# Patient Record
Sex: Female | Born: 1955 | Race: White | Hispanic: No | Marital: Married | Smoking: Never smoker
Health system: Southern US, Community
[De-identification: ages and names within clinical notes are randomized; demographics above are authoritative.]

## PROBLEM LIST (undated history)

## (undated) DIAGNOSIS — G4730 Sleep apnea, unspecified: Secondary | ICD-10-CM

## (undated) DIAGNOSIS — I10 Essential (primary) hypertension: Secondary | ICD-10-CM

## (undated) DIAGNOSIS — R32 Unspecified urinary incontinence: Secondary | ICD-10-CM

## (undated) HISTORY — PX: GALLBLADDER SURGERY: SHX652

## (undated) HISTORY — DX: Unspecified urinary incontinence: R32

## (undated) HISTORY — DX: Essential (primary) hypertension: I10

## (undated) HISTORY — DX: Sleep apnea, unspecified: G47.30

## (undated) HISTORY — PX: CHOLECYSTECTOMY: SHX55

## (undated) HISTORY — PX: ROTATOR CUFF REPAIR: SHX139

## (undated) HISTORY — PX: FRACTURE SURGERY: SHX138

## (undated) HISTORY — PX: JOINT REPLACEMENT: SHX530

---

## 2003-05-18 HISTORY — PX: TOTAL ABDOMINAL HYSTERECTOMY: SHX209

## 2013-12-24 LAB — BASIC METABOLIC PANEL
BUN: 20 mg/dL (ref 4–21)
CREATININE: 1 mg/dL (ref 0.5–1.1)

## 2013-12-24 LAB — CBC AND DIFFERENTIAL
HEMATOCRIT: 43 % (ref 36–46)
HEMOGLOBIN: 15 g/dL (ref 12.0–16.0)
Neutrophils Absolute: 5 /uL
Platelets: 365 10*3/uL (ref 150–399)
WBC: 8.5 10^3/mL

## 2013-12-24 LAB — HM PAP SMEAR: HM Pap smear: 10115

## 2013-12-24 LAB — TSH: TSH: 3.1 u[IU]/mL (ref 0.41–5.90)

## 2013-12-24 LAB — LIPID PANEL
Cholesterol: 219 mg/dL — AB (ref 0–200)
HDL: 58 mg/dL (ref 35–70)
LDL CALC: 137 mg/dL
TRIGLYCERIDES: 122 mg/dL (ref 40–160)

## 2013-12-24 LAB — HEPATIC FUNCTION PANEL
ALT: 15 U/L (ref 7–35)
AST: 19 U/L (ref 13–35)
Alkaline Phosphatase: 62 U/L (ref 25–125)
Bilirubin, Total: 0.4 mg/dL

## 2013-12-27 ENCOUNTER — Ambulatory Visit (INDEPENDENT_AMBULATORY_CARE_PROVIDER_SITE_OTHER): Payer: No Typology Code available for payment source | Admitting: Family Medicine

## 2013-12-27 ENCOUNTER — Encounter: Payer: Self-pay | Admitting: Family Medicine

## 2013-12-27 VITALS — BP 177/93 | HR 92 | Temp 97.7°F | Resp 16 | Ht 61.5 in | Wt 183.0 lb

## 2013-12-27 DIAGNOSIS — E8940 Asymptomatic postprocedural ovarian failure: Secondary | ICD-10-CM | POA: Insufficient documentation

## 2013-12-27 DIAGNOSIS — E669 Obesity, unspecified: Secondary | ICD-10-CM | POA: Insufficient documentation

## 2013-12-27 DIAGNOSIS — Z79890 Hormone replacement therapy (postmenopausal): Secondary | ICD-10-CM

## 2013-12-27 DIAGNOSIS — I10 Essential (primary) hypertension: Secondary | ICD-10-CM

## 2013-12-27 DIAGNOSIS — Z87448 Personal history of other diseases of urinary system: Secondary | ICD-10-CM

## 2013-12-27 DIAGNOSIS — E66811 Obesity, class 1: Secondary | ICD-10-CM | POA: Insufficient documentation

## 2013-12-27 MED ORDER — HYDROCHLOROTHIAZIDE 25 MG PO TABS: 25.0000 mg | ORAL_TABLET | Freq: Every day | ORAL | Status: DC

## 2013-12-27 MED ORDER — METOPROLOL TARTRATE 100 MG PO TABS: 100.0000 mg | ORAL_TABLET | Freq: Two times a day (BID) | ORAL | Status: DC

## 2013-12-27 MED ORDER — ENALAPRIL MALEATE 5 MG PO TABS: 5.0000 mg | ORAL_TABLET | Freq: Every day | ORAL | Status: DC

## 2013-12-27 NOTE — Patient Instructions (Signed)
Heart Disease Prevention Heart disease can lead to heart attacks and strokes. This is a leading cause of death. Heart disease can be inherited and can be caused from the lifestyle you lead. You can do a lot to keep your heart and blood vessels healthy.  WHAT SHOULD I DO EACH DAY TO KEEP MY HEART HEALTHY?  Do not smoke.  Follow a healthy eating plan as recommended by your caregiver or dietitian.  Be active for a total of 30 minutes most days. Ask your caregiver what activities are best for you.  Limit the amount of alcohol you drink.  Involve family and friends to help you with a healthy lifestyle. HOW DOES HEART DISEASE CAUSE HIGH BLOOD PRESSURE?  Narrowed blood vessels leave a smaller opening for blood to flow through. It is like turning on a garden hose and holding your thumb over the opening. The smaller opening makes the water shoot out with more pressure. In the same way, narrowed blood vessels can lead to high blood pressure. Other factors, such as kidney problems and being overweight, also can lead to high blood pressure.  If you have high blood pressure you may need to take blood pressure medicine every day. Some types of blood pressure medicine can also help keep your kidneys healthy.  Many people with diabetes also have high blood pressure. If you have heart, eye, or kidney problems from diabetes, high blood pressure can make them worse. HOW DO MY BLOOD VESSELS GET CLOGGED?  Cholesterol is a substance that is made by the body and used for many important functions. It is also found in food that comes from animals. When your cholesterol is high, it can stick to the insides of your blood vessels, making them narrowed and even clogged. This problem is called atherosclerosis.  Narrowed and clogged blood vessels make it harder for blood to get to important body organs. This can cause problems such as:  Chest pain (angina). Angina can cause temporary pain in your chest, arms, shoulders,  or back. You may feel the pain more when your heart beats faster, such as when you exercise. The pain may go away when you rest. You also may feel very weak and sweaty.  A heart attack. A heart attack happens when a blood vessel in or near the heart becomes blocked. Not enough blood is getting to the heart. During a heart attack, you may have chest pain in your chest, arms, shoulders, or back along with nausea, indigestion, extreme weakness, and sweating. WHAT CAN I DO TO PREVENT HEART DISEASE?   Keep your blood pressure under control as recommended by your caregiver.  Keep your cholesterol under control. Have it checked at least once a year. Target cholesterol levels for most people are:  Total blood cholesterol level: Below 200.  LDL (bad) cholesterol: Below 100.  HDL (good) cholesterol: Above 40 in men and above 50 in women.  Triglycerides (another type of fat in the blood): Below 150.  Make physical activity a part of your daily routine. Check with your caregiver to learn what activities are best for you.  Make sure that the foods you eat are "heart-healthy."  Include foods high in fiber, such as oat bran, oatmeal, whole-grain breads and cereals.  Cut back on fried foods and foods high in saturated fat. This includes foods such as meats, butter, whole dairy products, shortening, and coconut or palm oil.  Avoid salty foods such as canned food, luncheon meat, salty snacks, and fast food.    Eat more fruits and vegetables.  Drink less alcohol.  Lose weight as recommended by your caregiver.  If you smoke, quit. Your caregiver can help you with quitting options.  Ask your caregiver whether you should take a daily aspirin. Studies have shown that taking aspirin can help reduce your risk of heart disease and stroke.  Take your prescribed medicines as directed. WHAT ARE THE WARNING SIGNS OF A HEART ATTACK? You may have one or more of the following warning signs:  Chest pain or  discomfort.  Pain or discomfort in your arms, back, jaw, or neck.  Indigestion or stomach pain.  Shortness of breath.  Sweating.  Nausea or vomiting.  Lightheadedness.  No warning signs at all or they may come and go. FOR MORE INFORMATION  To find out more about heart disease and stroke prevention, visit the American Heart Association website at www.americanheart.org Document Released: 12/16/2003 Document Revised: 11/02/2011 Document Reviewed: 06/27/2013 [facility] Patient Information 2015 [place], [place]. This information is not intended to replace advice given to you by your health care provider. Make sure you discuss any questions you have with your health care provider.        Mediterranean Diet  Why follow it? Research shows.   Those who follow the Mediterranean diet have a reduced risk of heart disease    The diet is associated with a reduced incidence of Parkinson's and Alzheimer's diseases   People following the diet may have longer life expectancies and lower rates of chronic diseases    The Dietary Guidelines for Americans recommends the Mediterranean diet as an eating plan to promote health and prevent disease  What Is the Mediterranean Diet?    Healthy eating plan based on typical foods and recipes of Mediterranean-style cooking   The diet is primarily a plant based diet; these foods should make up a majority of meals   Starches - Plant based foods should make up a majority of meals - They are an important sources of vitamins, minerals, energy, antioxidants, and fiber - Choose whole grains, foods high in fiber and minimally processed items  - Typical grain sources include wheat, oats, barley, corn, brown rice, bulgar, farro, millet, polenta, couscous  - Various types of beans include chickpeas, lentils, fava beans, black beans, white beans   Fruits  Veggies - Large quantities of antioxidant rich fruits & veggies; 6 or more servings  - Vegetables can be eaten raw or  lightly drizzled with oil and cooked  - Vegetables common to the traditional Mediterranean Diet include: artichokes, arugula, beets, broccoli, brussel sprouts, cabbage, carrots, celery, collard greens, cucumbers, eggplant, kale, leeks, lemons, lettuce, mushrooms, okra, onions, peas, peppers, potatoes, pumpkin, radishes, rutabaga, shallots, spinach, sweet potatoes, turnips, zucchini - Fruits common to the Mediterranean Diet include: apples, apricots, avocados, cherries, clementines, dates, figs, grapefruits, grapes, melons, nectarines, oranges, peaches, pears, pomegranates, strawberries, tangerines  Fats - Replace butter and margarine with healthy oils, such as olive oil, canola oil, and tahini  - Limit nuts to no more than a handful a day  - Nuts include walnuts, almonds, pecans, pistachios, pine nuts  - Limit or avoid candied, honey roasted or heavily salted nuts - Olives are central to the [employer]iterranean diet - can be eaten whole or used in a variety of dishes   Meats Protein - Limiting red meat: no more than a few times a month - When eating red meat: choose lean cuts and keep the portion to the size of deck of cards -  Eggs: approx. 0 to 4 times a week  - Fish and lean poultry: at least 2 a week  - Healthy protein sources include, chicken, [place], lean beef, lamb - Increase intake of seafood such as tuna, salmon, trout, mackerel, shrimp, scallops - Avoid or limit high fat processed meats such as sausage and bacon  Dairy - Include moderate amounts of low fat dairy products  - Focus on healthy dairy such as fat free yogurt, skim milk, low or reduced fat cheese - Limit dairy products higher in fat such as whole or 2% milk, cheese, ice cream  Alcohol - Moderate amounts of red wine is ok  - No more than 5 oz daily for women (all ages) and men older than age 55  - No more than 10 oz of wine daily for men younger than 83  Other - Limit sweets and other desserts  - Use herbs and spices instead of  salt to flavor foods  - Herbs and spices common to the traditional Mediterranean Diet include: basil, bay leaves, chives, cloves, cumin, fennel, garlic, lavender, marjoram, mint, oregano, parsley, pepper, rosemary, sage, savory, sumac, tarragon, thyme   It's not just a diet, it's a lifestyle:    The Mediterranean diet includes lifestyle factors typical of those in the region    Foods, drinks and meals are best eaten with others and savored   Daily physical activity is important for overall good health   This could be strenuous exercise like running and aerobics   This could also be more leisurely activities such as walking, housework, yard-work, or taking the stairs   Moderation is the key; a balanced and healthy diet accommodates most foods and drinks   Consider portion sizes and frequency of consumption of certain foods   Meal Ideas & Options:    Breakfast:  o Whole wheat toast or whole wheat English muffins with peanut butter & hard boiled egg o Steel cut oats topped with apples & cinnamon and skim milk  o Fresh fruit: banana, strawberries, melon, berries, peaches  o Smoothies: strawberries, bananas, greek yogurt, peanut butter o Low fat greek yogurt with blueberries and granola  o Egg white omelet with spinach and mushrooms o Breakfast couscous: whole wheat couscous, apricots, skim milk, cranberries    Sandwiches:  o Hummus and grilled vegetables (peppers, zucchini, squash) on whole wheat bread   o Grilled chicken on whole wheat pita with lettuce, tomatoes, cucumbers or tzatziki  o Tuna salad on whole wheat bread: tuna salad made with greek yogurt, olives, red peppers, capers, green onions o Garlic rosemary lamb pita: lamb sauted with garlic, rosemary, salt & pepper; add lettuce, cucumber, greek yogurt to pita - flavor with lemon juice and black pepper    Seafood:  o Mediterranean grilled salmon, seasoned with garlic, basil, parsley, lemon juice and black pepper o Shrimp, lemon, and  spinach whole-grain pasta salad made with low fat greek yogurt  o Seared scallops with lemon orzo  o Seared tuna steaks seasoned salt, pepper, coriander topped with tomato mixture of olives, tomatoes, olive oil, minced garlic, parsley, green onions and cappers    Meats:  o Herbed greek chicken salad with kalamata olives, cucumber, feta  o Red bell peppers stuffed with spinach, bulgur, lean ground beef (or lentils) & topped with feta   o Kebabs: skewers of chicken, tomatoes, onions, zucchini, squash  o [place] burgers: made with red onions, mint, dill, lemon juice, feta cheese topped with roasted red peppers  Vegetarian o Cucumber salad: cucumbers, artichoke hearts, celery, red onion, feta cheese, tossed in olive oil & lemon juice  o Hummus and whole grain pita points with a greek salad (lettuce, tomato, feta, olives, cucumbers, red onion) o Lentil soup with celery, carrots made with vegetable broth, garlic, salt and pepper  o Tabouli salad: parsley, bulgur, mint, scallions, cucumbers, tomato, radishes, lemon juice, olive oil, salt and pepper.    Hematuria Hematuria is blood in your urine. It can be caused by a bladder infection, kidney infection, prostate infection, kidney stone, or cancer of your urinary tract. Infections can usually be treated with medicine, and a kidney stone usually will pass through your urine. If neither of these is the cause of your hematuria, further workup to find out the reason may be needed. It is very important that you tell your health care provider about any blood you see in your urine, even if the blood stops without treatment or happens without causing pain. Blood in your urine that happens and then stops and then happens again can be a symptom of a very serious condition. Also, pain is not a symptom in the initial stages of many urinary cancers. HOME CARE INSTRUCTIONS   Drink lots of fluid, 3-4 quarts a day. If you have been diagnosed with an infection,  cranberry juice is especially recommended, in addition to large amounts of water.  Avoid caffeine, tea, and carbonated beverages because they tend to irritate the bladder.  Avoid alcohol because it may irritate the prostate.  Take all medicines as directed by your health care provider.  If you were prescribed an antibiotic medicine, finish it all even if you start to feel better.  If you have been diagnosed with a kidney stone, follow your health care provider's instructions regarding straining your urine to catch the stone.  Empty your bladder often. Avoid holding urine for long periods of time.  After a bowel movement, women should cleanse front to back. Use each tissue only once.  Empty your bladder before and after sexual intercourse if you are a female. SEEK MEDICAL CARE IF:  You develop back pain.  You have a fever.  You have a feeling of sickness in your stomach (nausea) or vomiting.  Your symptoms are not better in 3 days. Return sooner if you are getting worse. SEEK IMMEDIATE MEDICAL CARE IF:   You develop severe vomiting and are unable to keep the medicine down.  You develop severe back or abdominal pain despite taking your medicines.  You begin passing a large amount of blood or clots in your urine.  You feel extremely weak or faint, or you pass out. MAKE SURE YOU:   Understand these instructions.  Will watch your condition.  Will get help right away if you are not doing well or get worse. Document Released: 05/03/2005 Document Revised: 09/17/2013 Document Reviewed: 01/01/2013 [facility] Patient Information 2015 [place], [place]. This information is not intended to replace advice given to you by your health care provider. Make sure you discuss any questions you have with your health care provider.

## 2014-01-04 ENCOUNTER — Encounter: Payer: Self-pay | Admitting: Family Medicine

## 2014-01-04 ENCOUNTER — Telehealth: Payer: Self-pay | Admitting: Family Medicine

## 2014-01-04 NOTE — Telephone Encounter (Signed)
Patient states she would like a medication prescribed for a topical cream of testosterone 1%. Patient said she uses a compounding pharmacy and the number is [phone]  [phone]

## 2014-01-04 NOTE — Telephone Encounter (Signed)
LM for pt to RTC for discussion on using testosterone cream.

## 2014-01-06 NOTE — Progress Notes (Signed)
Subjective:    Patient ID: Erika Wolf, female    DOB: 1955-07-26, 58 y.o.   MRN: [id number]  HPI  This 58 y.o. Cauc female is new to [facility]; her family relocated to [place], [place] several months ago. She has HTN which has been well controlled when pt is taking all prescribed medications. She has been "stretching" medication until she could get established  w/ this practice. Healthy lifestyle including nutrition and fitness have helped pt w/ weight reduction.   Pt has a hx of microscopic hematuria; she denies symptoms and hx for renal stones or bladder disease is negative.  Past Medical History  Diagnosis Date  . HTN (hypertension)    Prior to Admission medications   Medication Sig Start Date End Date Taking? Authorizing Provider  enalapril (VASOTEC) 5 MG tablet Take 1 tablet (5 mg total) by mouth daily.   Yes   hydrochlorothiazide (HYDRODIURIL) 25 MG tablet Take 1 tablet (25 mg total) by mouth daily.   Yes   metoprolol (LOPRESSOR) 100 MG tablet Take 1 tablet (100 mg total) by mouth 2 (two) times daily.   Yes     History   Social History  . Marital Status: Married    Spouse Name: N/A    Number of Children: N/A  . Years of Education: N/A   Occupational History  . homemaker    Social History Main Topics  . Smoking status: Never Smoker   . Smokeless tobacco: Not on file  . Alcohol Use: Yes  . Drug Use: No  . Sexual Activity: Yes   Other Topics Concern  . Not on file   Social History Narrative   Married;   5 children, 4 live in [place], 1 lives in [place].   2 grandchildren          Family History  Problem Relation Age of Onset  . Hyperlipidemia Mother   . Hypertension Mother   . Cancer Father     colon  . Aneurysm Father     brain  . Cancer Sister     ovarian  . Hyperlipidemia Brother     Review of Systems  Constitutional: Negative.   HENT: Negative.   Eyes: Negative for photophobia, pain, redness and visual disturbance.  Respiratory: Negative for  cough, chest tightness and shortness of breath.   Cardiovascular: Negative.   Endocrine: Negative.   Genitourinary: Positive for hematuria. Negative for dysuria, urgency, frequency, flank pain, difficulty urinating, vaginal pain and pelvic pain.  Musculoskeletal: Negative.   Skin: Negative.   Allergic/Immunologic: Negative.   Neurological: Negative for dizziness, syncope, weakness, light-headedness, numbness and headaches.  Psychiatric/Behavioral: Negative for confusion, sleep disturbance and dysphoric mood. The patient is not nervous/anxious.       Objective:   Physical Exam  Nursing note and vitals reviewed. Constitutional: She is oriented to person, place, and time. She appears well-developed and well-nourished. No distress.  HENT:  Head: Normocephalic and atraumatic.  Right Ear: External ear normal.  Left Ear: External ear normal.  Nose: Nose normal.  Mouth/Throat: Oropharynx is clear and moist.  Eyes: Conjunctivae and EOM are normal. Pupils are equal, round, and reactive to light. No scleral icterus.  Neck: Normal range of motion. Neck supple. No JVD present. No thyromegaly present.  Cardiovascular: Normal rate, regular rhythm and normal heart sounds.  Exam reveals no gallop and no friction rub.   No murmur heard. Pulmonary/Chest: Effort normal and breath sounds normal. No respiratory distress.  Abdominal: Soft. Normal appearance and bowel  sounds are normal. She exhibits no distension, no pulsatile midline mass and no mass. There is no hepatosplenomegaly. There is no tenderness. There is no guarding and no CVA tenderness.  Musculoskeletal: Normal range of motion. She exhibits no edema and no tenderness.  Lymphadenopathy:    She has no cervical adenopathy.  Neurological: She is alert and oriented to person, place, and time. She has normal reflexes. No cranial nerve deficit. She exhibits normal muscle tone. Coordination normal.  Skin: Skin is warm and dry. She is not diaphoretic. No  erythema. No pallor.  Psychiatric: She has a normal mood and affect. Her behavior is normal. Judgment and thought content normal.       Assessment & Plan:  Essential hypertension- Continue current medications; encourage TLCs.  History of hematuria- Request records; check UA at next visit.  Meds ordered this encounter  Medications  . enalapril (VASOTEC) 5 MG tablet    Sig: Take 1 tablet (5 mg total) by mouth daily.    Dispense:  90 tablet    Refill:  1  . hydrochlorothiazide (HYDRODIURIL) 25 MG tablet    Sig: Take 1 tablet (25 mg total) by mouth daily.    Dispense:  90 tablet    Refill:  1  . metoprolol (LOPRESSOR) 100 MG tablet    Sig: Take 1 tablet (100 mg total) by mouth 2 (two) times daily.    Dispense:  180 tablet    Refill:  1

## 2014-04-04 ENCOUNTER — Encounter: Payer: No Typology Code available for payment source | Admitting: Family Medicine

## 2017-11-04 LAB — HM MAMMOGRAPHY

## 2017-11-08 ENCOUNTER — Encounter: Payer: Self-pay | Admitting: Family Medicine

## 2017-11-08 ENCOUNTER — Ambulatory Visit (INDEPENDENT_AMBULATORY_CARE_PROVIDER_SITE_OTHER): Payer: No Typology Code available for payment source

## 2017-11-08 ENCOUNTER — Ambulatory Visit: Payer: No Typology Code available for payment source | Admitting: Family Medicine

## 2017-11-08 VITALS — BP 150/90 | HR 102 | Temp 98.2°F | Resp 17 | Ht 62.0 in | Wt 179.0 lb

## 2017-11-08 DIAGNOSIS — R0609 Other forms of dyspnea: Secondary | ICD-10-CM | POA: Diagnosis not present

## 2017-11-08 DIAGNOSIS — I10 Essential (primary) hypertension: Secondary | ICD-10-CM | POA: Diagnosis not present

## 2017-11-08 DIAGNOSIS — R059 Cough, unspecified: Secondary | ICD-10-CM

## 2017-11-08 DIAGNOSIS — J22 Unspecified acute lower respiratory infection: Secondary | ICD-10-CM

## 2017-11-08 DIAGNOSIS — R062 Wheezing: Secondary | ICD-10-CM

## 2017-11-08 DIAGNOSIS — Z7189 Other specified counseling: Secondary | ICD-10-CM | POA: Diagnosis not present

## 2017-11-08 DIAGNOSIS — R05 Cough: Secondary | ICD-10-CM

## 2017-11-08 MED ORDER — METOPROLOL TARTRATE 100 MG PO TABS: 100.0000 mg | ORAL_TABLET | Freq: Two times a day (BID) | ORAL | 1 refills | Status: DC

## 2017-11-08 MED ORDER — AZITHROMYCIN 250 MG PO TABS: ORAL_TABLET | ORAL | 0 refills | Status: DC

## 2017-11-08 MED ORDER — ALBUTEROL SULFATE HFA 108 (90 BASE) MCG/ACT IN AERS: 1.0000 | INHALATION_SPRAY | RESPIRATORY_TRACT | 0 refills | Status: DC | PRN

## 2017-11-08 MED ORDER — ENALAPRIL MALEATE 10 MG PO TABS: 10.0000 mg | ORAL_TABLET | Freq: Every day | ORAL | 1 refills | Status: DC

## 2017-11-08 MED ORDER — HYDROCHLOROTHIAZIDE 25 MG PO TABS: 25.0000 mg | ORAL_TABLET | Freq: Every day | ORAL | 1 refills | Status: DC

## 2017-11-08 MED ORDER — ESTRADIOL 1 MG PO TABS: 1.0000 mg | ORAL_TABLET | Freq: Every day | ORAL | 1 refills | Status: DC

## 2017-11-08 NOTE — Progress Notes (Addendum)
Subjective:  By signing my name below, I, Moises Blood, attest that this documentation has been prepared under the direction and in the presence of Merri Ray, MD. Electronically Signed: Moises Blood, [place]. 11/08/2017 , 3:19 PM .  Patient was seen in Room 3 .   Patient ID: Erika Wolf, female    DOB: [birth year], 62 y.o.   MRN: [id number] Chief Complaint  Patient presents with  . Establish Care   HPI Erika Wolf is a 62 y.o. female Here to establish care. She switched care due to insurance coverage. She is not fasting today.   HTN Appears patient was last seen here almost 4 years ago by Dr. Leward Quan for HTN. Appears patient has been seen by Dr. Claiborne Billings at [place], with last visit in Sept 2018. Note reviewed, she's on Vasotec 5 mg, HCTZ 25 mg, and metoprolol 100 mg bid.   BP Readings from Last 3 Encounters:  11/08/17 (!) 150/90  12/27/13 (!) 177/93   Creatinine was most recently 1.10 with eGFR of 51 in Jan 2018. Her BP was 130/84 at Sept 2018 visit with Dr. Claiborne Billings at [place].   Patient mentions her BP was running around 150s/80s while on medications. She states she started having productive cough with some wheezing started around 1 month ago. She came off of her metoprolol for about 2 weeks, and her BP was running around 150/91, but cough worsened. She restarted metoprolol for about a week now, with cough improved. She mentions history of exercise induced asthma, and never really used an inhaler for it. She noticed shortness of breath when walking up hills starting about 2 weeks ago. She denies history of heart problems. She denies chest pain or shortness of breath. She's been using Flonase and Aller-tec for her seasonal allergies, doing well on it. She denies history of smoking. She denies heartburn, burping, belching, night sweats, fever, unexplained weight change, or swelling.   OBGYN She is followed by Harle Battiest, Dr. Kris Mouton with [facility] physicians in [facility], with exam on May 2018. She had hysterectomy done in the past for fibroids, and was on estrogen replacement therapy at that time with Estrace 1 mg. She had normal TSH at that time. She denies personal history of cancer. Her daughter was recently diagnosed with breast cancer. Patient has noticed some lumps, but had negative mammograms.    Patient Active Problem List   Diagnosis Date Noted  . History of hematuria 12/27/2013  . Essential hypertension 12/27/2013  . Surgical menopause on hormone replacement therapy 12/27/2013  . Obesity (BMI 30.0-34.9) 12/27/2013   Past Medical History:  Diagnosis Date  . HTN (hypertension)    Past Surgical History:  Procedure Laterality Date  . CESAREAN SECTION  5 total  . GALLBLADDER SURGERY    . TOTAL ABDOMINAL HYSTERECTOMY  2005   Fibroid ovaries and tubes removed also   No Known Allergies Prior to Admission medications   Medication Sig Start Date End Date Taking? Authorizing Provider  enalapril (VASOTEC) 5 MG tablet Take 1 tablet (5 mg total) by mouth daily. 12/27/13  Yes Barton Fanny, MD  hydrochlorothiazide (HYDRODIURIL) 25 MG tablet Take 1 tablet (25 mg total) by mouth daily. 12/27/13  Yes Barton Fanny, MD  metoprolol (LOPRESSOR) 100 MG tablet Take 1 tablet (100 mg total) by mouth 2 (two) times daily. 12/27/13  Yes Barton Fanny, MD   Social History   Socioeconomic History  . Marital status: Married  Spouse name: Not on file  . Number of children: Not on file  . Years of education: Not on file  . Highest education level: Not on file  Occupational History  . Occupation: homemaker  Social Needs  . Financial resource strain: Not on file  . Food insecurity:    Worry: Not on file    Inability: Not on file  . Transportation needs:    Medical: Not on file    Non-medical: Not on file  Tobacco Use  . Smoking status: Never Smoker  . Smokeless tobacco: Never Used  Substance and Sexual  Activity  . Alcohol use: Yes  . Drug use: No  . Sexual activity: Yes  Lifestyle  . Physical activity:    Days per week: Not on file    Minutes per session: Not on file  . Stress: Not on file  Relationships  . Social connections:    Talks on phone: Not on file    Gets together: Not on file    Attends religious service: Not on file    Active member of club or organization: Not on file    Attends meetings of clubs or organizations: Not on file    Relationship status: Not on file  . Intimate partner violence:    Fear of current or ex partner: Not on file    Emotionally abused: Not on file    Physically abused: Not on file    Forced sexual activity: Not on file  Other Topics Concern  . Not on file  Social History Narrative   Married;   5 children, 4 live in [place], 1 lives in [place].   2 grandchildren          Review of Systems  Constitutional: Negative for appetite change, chills, diaphoresis, fatigue, fever and unexpected weight change.  HENT: Positive for congestion.   Respiratory: Positive for cough, shortness of breath and wheezing. Negative for chest tightness.   Cardiovascular: Negative for chest pain, palpitations and leg swelling.  Gastrointestinal: Negative for abdominal pain and blood in stool.  Neurological: Negative for dizziness, syncope, light-headedness and headaches.       Objective:   Physical Exam  Constitutional: She is oriented to person, place, and time. She appears well-developed and well-nourished. No distress.  HENT:  Head: Normocephalic and atraumatic.  Right Ear: Hearing, tympanic membrane, external ear and ear canal normal.  Left Ear: Hearing, tympanic membrane, external ear and ear canal normal.  Nose: Nose normal.  Mouth/Throat: Oropharynx is clear and moist. No oropharyngeal exudate.  Eyes: Pupils are equal, round, and reactive to light. Conjunctivae and EOM are normal.  Neck: Carotid bruit is not present.  Cardiovascular: Normal rate,  regular rhythm, normal heart sounds and intact distal pulses.  No murmur heard. Pulmonary/Chest: Effort normal and breath sounds normal. No respiratory distress. She has no wheezes. She has no rhonchi.  Abdominal: Soft. She exhibits no pulsatile midline mass. There is no tenderness.  Neurological: She is alert and oriented to person, place, and time.  Skin: Skin is warm and dry. No rash noted.  Psychiatric: She has a normal mood and affect. Her behavior is normal.  Vitals reviewed.   Vitals:   11/08/17 1449 11/08/17 1524  BP: (!) 151/90 (!) 150/90  Pulse: (!) 102   Resp: 17   Temp: 98.2 F (36.8 C)   TempSrc: Oral   SpO2: 98%   Weight: 179 lb (81.2 kg)   Height: '5\' 2"'$  (1.575 m)  Dg Chest 2 View  Result Date: 11/08/2017 CLINICAL DATA:  Cough and wheezing for the past month. EXAM: CHEST - 2 VIEW COMPARISON:  None. FINDINGS: Normal sized heart. Minimal linear density at the left lung base. Clear right lung. Minimal peribronchial thickening. Tiny calcified granuloma in each lung. Right shoulder fixation anchors. Cholecystectomy clips. Mild thoracic spine degenerative changes and mild scoliosis. IMPRESSION: 1. Minimal linear atelectasis or scarring at the left lung base. 2. Minimal bronchitic changes. Electronically Signed   By: Claudie Revering M.D.   On: 11/08/2017 15:43       Assessment & Plan:  Erika Wolf is a 62 y.o. female Essential hypertension - Plan: Basic metabolic panel, metoprolol tartrate (LOPRESSOR) 100 MG tablet, hydrochlorothiazide (HYDRODIURIL) 25 MG tablet, enalapril (VASOTEC) 10 MG tablet  -Increase enalapril to 10 mg daily.  Continue same dose HCTZ, Lopressor for now.  Check BMP  Cough - Plan: DG Chest 2 View, azithromycin (ZITHROMAX) 250 MG tablet DOE (dyspnea on exertion) - Plan: Pro b natriuretic peptide Wheezing - Plan: albuterol (PROVENTIL HFA;VENTOLIN HFA) 108 (90 Base) MCG/ACT inhaler  LRTI (lower respiratory tract infection) - Plan: azithromycin  (ZITHROMAX) 250 MG tablet  -BNP obtained but reassuring.    -Left lung base atelectasis versus infiltrate versus scarring.  Based on symptoms we will start azithromycin, albuterol if needed for wheezing, potential side effects and risk discussed with these medications. RTC precautions if not improving  -Continue over-the-counter allergy treatment as likely contributor to symptoms, and other symptomatic care.   Counseling for estrogen replacement therapy - Plan: estradiol (ESTRACE) 1 MG tablet, Ambulatory referral to Gynecology  -We will refer to gynecology for ongoing care.  Continue Estrace for now.    Meds ordered this encounter  Medications  . estradiol (ESTRACE) 1 MG tablet    Sig: Take 1 tablet (1 mg total) by mouth daily.    Dispense:  90 tablet    Refill:  1  . metoprolol tartrate (LOPRESSOR) 100 MG tablet    Sig: Take 1 tablet (100 mg total) by mouth 2 (two) times daily.    Dispense:  180 tablet    Refill:  1  . hydrochlorothiazide (HYDRODIURIL) 25 MG tablet    Sig: Take 1 tablet (25 mg total) by mouth daily.    Dispense:  90 tablet    Refill:  1  . enalapril (VASOTEC) 10 MG tablet    Sig: Take 1 tablet (10 mg total) by mouth daily.    Dispense:  90 tablet    Refill:  1  . albuterol (PROVENTIL HFA;VENTOLIN HFA) 108 (90 Base) MCG/ACT inhaler    Sig: Inhale 1-2 puffs into the lungs every 4 (four) hours as needed for wheezing or shortness of breath.    Dispense:  1 Inhaler    Refill:  0  . azithromycin (ZITHROMAX) 250 MG tablet    Sig: Take 2 pills by mouth on day 1, then 1 pill by mouth per day on days 2 through 5.    Dispense:  6 tablet    Refill:  0   Patient Instructions   Blood pressure appears to be somewhat elevated.  Would recommend increasing the enalapril to 10 mg once per day.  Watch for low blood pressure symptoms, especially with first standing up in the morning or if you have been seated or lying down for some time.  If you do experience these symptoms, please  return to discuss other changes in medications.  No change in dose of HCTZ  or metoprolol at this time.  Cough may be due to a number of causes.  Potentially from postnasal drip from allergies, but some possible congestion was noted on chest x-ray.  Will start antibiotic for now, albuterol if needed for wheezing (If you require that medicine daily, or multiple times per day, please follow-up to discuss other treatment), Mucinex if needed for cough throughout the day. Follow up in 1 month for recheck cough and possible repeat xray.   I will check other blood test for less likely causes.  Okay to continue over-the-counter antihistamine if needed for allergies as well as Flonase nasal spray if you are noticing more nasal congestion with postnasal drip. Return to the clinic or go to the nearest emergency room if any of your symptoms worsen or new symptoms occur.  If you do have wheezing, can use albuterol inhaler that was prescribed today.  See instructions on use of that medication below.    Thank you for coming in today.   How to Use a Metered Dose Inhaler A metered dose inhaler is a handheld device for taking medicine that must be breathed into the lungs (inhaled). The device can be used to deliver a variety of inhaled medicines, including:  Quick relief or rescue medicines, such as bronchodilators.  Controller medicines, such as corticosteroids.  The medicine is delivered by pushing down on a metal canister to release a preset amount of spray and medicine. Each device contains the amount of medicine that is needed for a preset number of uses (inhalations). Your health care provider may recommend that you use a spacer with your inhaler to help you take the medicine more effectively. A spacer is a plastic tube with a mouthpiece on one end and an opening that connects to the inhaler on the other end. A spacer holds the medicine in a tube for a short time, which allows you to inhale more  medicine. What are the risks? If you do not use your inhaler correctly, medicine might not reach your lungs to help you breathe. Inhaler medicine can cause side effects, such as:  Mouth or throat infection.  Cough.  Hoarseness.  Headache.  Nausea and vomiting.  Lung infection (pneumonia) in people who have a lung condition called COPD.  How to use a metered dose inhaler without a spacer 1. Remove the cap from the inhaler. 2. If you are using the inhaler for the first time, shake it for 5 seconds, turn it away from your face, then release 4 puffs into the air. This is called priming. 3. Shake the inhaler for 5 seconds. 4. Position the inhaler so the top of the canister faces up. 5. Put your index finger on the top of the medicine canister. Support the bottom of the inhaler with your thumb. 6. Breathe out normally and as completely as possible, away from the inhaler. 7. Either place the inhaler between your teeth and close your lips tightly around the mouthpiece, or hold the inhaler 1-2 inches (2.5-5 cm) away from your open mouth. Keep your tongue down out of the way. If you are unsure which technique to use, ask your health care provider. 8. Press the canister down with your index finger to release the medicine, then inhale deeply and slowly through your mouth (not your nose) until your lungs are completely filled. Inhaling should take 4-6 seconds. 9. Hold the medicine in your lungs for 5-10 seconds (10 seconds is best). This helps the medicine get into the small airways  of your lungs. 10. With your lips in a tight circle (pursed), breathe out slowly. 11. Repeat steps 3-10 until you have taken the number of puffs that your health care provider directed. Wait about 1 minute between puffs or as directed. 12. Put the cap on the inhaler. 13. If you are using a steroid inhaler, rinse your mouth with water, gargle, and spit out the water. Do not swallow the water. How to use a metered dose  inhaler with a spacer 1. Remove the cap from the inhaler. 2. If you are using the inhaler for the first time, shake it for 5 seconds, turn it away from your face, then release 4 puffs into the air. This is called priming. 3. Shake the inhaler for 5 seconds. 4. Place the open end of the spacer onto the inhaler mouthpiece. 5. Position the inhaler so the top of the canister faces up and the spacer mouthpiece faces you. 6. Put your index finger on the top of the medicine canister. Support the bottom of the inhaler and the spacer with your thumb. 7. Breathe out normally and as completely as possible, away from the spacer. 8. Place the spacer between your teeth and close your lips tightly around it. Keep your tongue down out of the way. 9. Press the canister down with your index finger to release the medicine, then inhale deeply and slowly through your mouth (not your nose) until your lungs are completely filled. Inhaling should take 4-6 seconds. 10. Hold the medicine in your lungs for 5-10 seconds (10 seconds is best). This helps the medicine get into the small airways of your lungs. 11. With your lips in a tight circle (pursed), breathe out slowly. 12. Repeat steps 3-11 until you have taken the number of puffs that your health care provider directed. Wait about 1 minute between puffs or as directed. 13. Remove the spacer from the inhaler and put the cap on the inhaler. 14. If you are using a steroid inhaler, rinse your mouth with water, gargle, and spit out the water. Do not swallow the water. Follow these instructions at home:  Take your inhaled medicine only as told by your health care provider. Do not use the inhaler more than directed by your health care provider.  Keep all follow-up visits as told by your health care provider. This is important.  If your inhaler has a counter, you can check it to determine how full your inhaler is. If your inhaler does not have a counter, ask your health care  provider when you will need to refill your inhaler and write the refill date on a calendar or on your inhaler canister. Note that you cannot know when an inhaler is empty by shaking it.  Follow directions on the package insert for care and cleaning of your inhaler and spacer. Contact a health care provider if:  Symptoms are only partially relieved with your inhaler.  You are having trouble using your inhaler.  You have an increase in phlegm.  You have headaches. Get help right away if:  You feel little or no relief after using your inhaler.  You have dizziness.  You have a fast heart rate.  You have chills or a fever.  You have night sweats.  There is blood in your phlegm. Summary  A metered dose inhaler is a handheld device for taking medicine that must be breathed into the lungs (inhaled).  The medicine is delivered by pushing down on a metal canister to  release a preset amount of spray and medicine.  Each device contains the amount of medicine that is needed for a preset number of uses (inhalations). This information is not intended to replace advice given to you by your health care provider. Make sure you discuss any questions you have with your health care provider. Document Released: 05/03/2005 Document Revised: 03/23/2016 Document Reviewed: 03/23/2016 Elsevier Interactive Patient Education  2017 [employer]   Cough, Adult Coughing is a reflex that clears your throat and your airways. Coughing helps to heal and protect your lungs. It is normal to cough occasionally, but a cough that happens with other symptoms or lasts a long time may be a sign of a condition that needs treatment. A cough may last only 2-3 weeks (acute), or it may last longer than 8 weeks (chronic). What are the causes? Coughing is commonly caused by:  Breathing in substances that irritate your lungs.  A viral or bacterial respiratory infection.  Allergies.  Asthma.  Postnasal  drip.  Smoking.  Acid backing up from the stomach into the esophagus (gastroesophageal reflux).  Certain medicines.  Chronic lung problems, including COPD (or rarely, lung cancer).  Other medical conditions such as heart failure.  Follow these instructions at home: Pay attention to any changes in your symptoms. Take these actions to help with your discomfort:  Take medicines only as told by your health care provider. ? If you were prescribed an antibiotic medicine, take it as told by your health care provider. Do not stop taking the antibiotic even if you start to feel better. ? Talk with your health care provider before you take a cough suppressant medicine.  Drink enough fluid to keep your urine clear or pale yellow.  If the air is dry, use a cold steam vaporizer or humidifier in your bedroom or your home to help loosen secretions.  Avoid anything that causes you to cough at work or at home.  If your cough is worse at night, try sleeping in a semi-upright position.  Avoid cigarette smoke. If you smoke, quit smoking. If you need help quitting, ask your health care provider.  Avoid caffeine.  Avoid alcohol.  Rest as needed.  Contact a health care provider if:  You have new symptoms.  You cough up pus.  Your cough does not get better after 2-3 weeks, or your cough gets worse.  You cannot control your cough with suppressant medicines and you are losing sleep.  You develop pain that is getting worse or pain that is not controlled with pain medicines.  You have a fever.  You have unexplained weight loss.  You have night sweats. Get help right away if:  You cough up blood.  You have difficulty breathing.  Your heartbeat is very fast. This information is not intended to replace advice given to you by your health care provider. Make sure you discuss any questions you have with your health care provider. Document Released: 10/30/2010 Document Revised: 10/09/2015  Document Reviewed: 07/10/2014 Elsevier Interactive Patient Education  2018 [employer].   Hypertension Hypertension, commonly called high blood pressure, is when the force of blood pumping through the arteries is too strong. The arteries are the blood vessels that carry blood from the heart throughout the body. Hypertension forces the heart to work harder to pump blood and may cause arteries to become narrow or stiff. Having untreated or uncontrolled hypertension can cause heart attacks, strokes, kidney disease, and other problems. A blood pressure reading consists of  a higher number over a lower number. Ideally, your blood pressure should be below 120/80. The first ("top") number is called the systolic pressure. It is a measure of the pressure in your arteries as your heart beats. The second ("bottom") number is called the diastolic pressure. It is a measure of the pressure in your arteries as the heart relaxes. What are the causes? The cause of this condition is not known. What increases the risk? Some risk factors for high blood pressure are under your control. Others are not. Factors you can change  Smoking.  Having type 2 diabetes mellitus, high cholesterol, or both.  Not getting enough exercise or physical activity.  Being overweight.  Having too much fat, sugar, calories, or salt (sodium) in your diet.  Drinking too much alcohol. Factors that are difficult or impossible to change  Having chronic kidney disease.  Having a family history of high blood pressure.  Age. Risk increases with age.  Race. You may be at higher risk if you are African-American.  Gender. Men are at higher risk than women before age 44. After age 97, women are at higher risk than men.  Having obstructive sleep apnea.  Stress. What are the signs or symptoms? Extremely high blood pressure (hypertensive crisis) may cause:  Headache.  Anxiety.  Shortness of breath.  Nosebleed.  Nausea and  vomiting.  Severe chest pain.  Jerky movements you cannot control (seizures).  How is this diagnosed? This condition is diagnosed by measuring your blood pressure while you are seated, with your arm resting on a surface. The cuff of the blood pressure monitor will be placed directly against the skin of your upper arm at the level of your heart. It should be measured at least twice using the same arm. Certain conditions can cause a difference in blood pressure between your right and left arms. Certain factors can cause blood pressure readings to be lower or higher than normal (elevated) for a short period of time:  When your blood pressure is higher when you are in a health care provider's office than when you are at home, this is called white coat hypertension. Most people with this condition do not need medicines.  When your blood pressure is higher at home than when you are in a health care provider's office, this is called masked hypertension. Most people with this condition may need medicines to control blood pressure.  If you have a high blood pressure reading during one visit or you have normal blood pressure with other risk factors:  You may be asked to return on a different day to have your blood pressure checked again.  You may be asked to monitor your blood pressure at home for 1 week or longer.  If you are diagnosed with hypertension, you may have other blood or imaging tests to help your health care provider understand your overall risk for other conditions. How is this treated? This condition is treated by making healthy lifestyle changes, such as eating healthy foods, exercising more, and reducing your alcohol intake. Your health care provider may prescribe medicine if lifestyle changes are not enough to get your blood pressure under control, and if:  Your systolic blood pressure is above 130.  Your diastolic blood pressure is above 80.  Your personal target blood pressure  may vary depending on your medical conditions, your age, and other factors. Follow these instructions at home: Eating and drinking  Eat a diet that is high in fiber and potassium,  and low in sodium, added sugar, and fat. An example eating plan is called the DASH (Dietary Approaches to Stop Hypertension) diet. To eat this way: ? Eat plenty of fresh fruits and vegetables. Try to fill half of your plate at each meal with fruits and vegetables. ? Eat whole grains, such as whole wheat pasta, brown rice, or whole grain bread. Fill about one quarter of your plate with whole grains. ? Eat or drink low-fat dairy products, such as skim milk or low-fat yogurt. ? Avoid fatty cuts of meat, processed or cured meats, and poultry with skin. Fill about one quarter of your plate with lean proteins, such as fish, chicken without skin, beans, eggs, and tofu. ? Avoid premade and processed foods. These tend to be higher in sodium, added sugar, and fat.  Reduce your daily sodium intake. Most people with hypertension should eat less than 1,500 mg of sodium a day.  Limit alcohol intake to no more than 1 drink a day for nonpregnant women and 2 drinks a day for men. One drink equals 12 oz of beer, 5 oz of wine, or 1 oz of hard liquor. Lifestyle  Work with your health care provider to maintain a healthy body weight or to lose weight. Ask what an ideal weight is for you.  Get at least 30 minutes of exercise that causes your heart to beat faster (aerobic exercise) most days of the week. Activities may include walking, swimming, or biking.  Include exercise to strengthen your muscles (resistance exercise), such as pilates or lifting weights, as part of your weekly exercise routine. Try to do these types of exercises for 30 minutes at least 3 days a week.  Do not use any products that contain nicotine or tobacco, such as cigarettes and e-cigarettes. If you need help quitting, ask your health care provider.  Monitor your  blood pressure at home as told by your health care provider.  Keep all follow-up visits as told by your health care provider. This is important. Medicines  Take over-the-counter and prescription medicines only as told by your health care provider. Follow directions carefully. Blood pressure medicines must be taken as prescribed.  Do not skip doses of blood pressure medicine. Doing this puts you at risk for problems and can make the medicine less effective.  Ask your health care provider about side effects or reactions to medicines that you should watch for. Contact a health care provider if:  You think you are having a reaction to a medicine you are taking.  You have headaches that keep coming back (recurring).  You feel dizzy.  You have swelling in your ankles.  You have trouble with your vision. Get help right away if:  You develop a severe headache or confusion.  You have unusual weakness or numbness.  You feel faint.  You have severe pain in your chest or abdomen.  You vomit repeatedly.  You have trouble breathing. Summary  Hypertension is when the force of blood pumping through your arteries is too strong. If this condition is not controlled, it may put you at risk for serious complications.  Your personal target blood pressure may vary depending on your medical conditions, your age, and other factors. For most people, a normal blood pressure is less than 120/80.  Hypertension is treated with lifestyle changes, medicines, or a combination of both. Lifestyle changes include weight loss, eating a healthy, low-sodium diet, exercising more, and limiting alcohol. This information is not intended to replace advice  given to you by your health care provider. Make sure you discuss any questions you have with your health care provider. Document Released: 05/03/2005 Document Revised: 03/31/2016 Document Reviewed: 03/31/2016 Elsevier Interactive Patient Education  2018 [employer].   IF you received an x-ray today, you will receive an invoice from [facility]. Please contact [facility] at [phone] with questions or concerns regarding your invoice.   IF you received labwork today, you will receive an invoice from [facility]. Please contact [facility] at [phone] with questions or concerns regarding your invoice.   Our billing staff will not be able to assist you with questions regarding bills from these companies.  You will be contacted with the lab results as soon as they are available. The fastest way to get your results is to activate your My Chart account. Instructions are located on the last page of this paperwork. If you have not heard from [place] regarding the results in 2 weeks, please contact this office.      I personally performed the services described in this documentation, which was scribed in my presence. The recorded information has been reviewed and considered for accuracy and completeness, addended by me as needed, and agree with information above.  Signed,   Merri Ray, MD [facility].  11/10/17 8:17 AM

## 2017-11-08 NOTE — Patient Instructions (Addendum)
Blood pressure appears to be somewhat elevated.  Would recommend increasing the enalapril to 10 mg once per day.  Watch for low blood pressure symptoms, especially with first standing up in the morning or if you have been seated or lying down for some time.  If you do experience these symptoms, please return to discuss other changes in medications.  No change in dose of HCTZ or metoprolol at this time.  Cough may be due to a number of causes.  Potentially from postnasal drip from allergies, but some possible congestion was noted on chest x-ray.  Will start antibiotic for now, albuterol if needed for wheezing (If you require that medicine daily, or multiple times per day, please follow-up to discuss other treatment), Mucinex if needed for cough throughout the day. Follow up in 1 month for recheck cough and possible repeat xray.   I will check other blood test for less likely causes.  Okay to continue over-the-counter antihistamine if needed for allergies as well as Flonase nasal spray if you are noticing more nasal congestion with postnasal drip. Return to the clinic or go to the nearest emergency room if any of your symptoms worsen or new symptoms occur.  If you do have wheezing, can use albuterol inhaler that was prescribed today.  See instructions on use of that medication below.    Thank you for coming in today.   How to Use a Metered Dose Inhaler A metered dose inhaler is a handheld device for taking medicine that must be breathed into the lungs (inhaled). The device can be used to deliver a variety of inhaled medicines, including:  Quick relief or rescue medicines, such as bronchodilators.  Controller medicines, such as corticosteroids.  The medicine is delivered by pushing down on a metal canister to release a preset amount of spray and medicine. Each device contains the amount of medicine that is needed for a preset number of uses (inhalations). Your health care provider may recommend that  you use a spacer with your inhaler to help you take the medicine more effectively. A spacer is a plastic tube with a mouthpiece on one end and an opening that connects to the inhaler on the other end. A spacer holds the medicine in a tube for a short time, which allows you to inhale more medicine. What are the risks? If you do not use your inhaler correctly, medicine might not reach your lungs to help you breathe. Inhaler medicine can cause side effects, such as:  Mouth or throat infection.  Cough.  Hoarseness.  Headache.  Nausea and vomiting.  Lung infection (pneumonia) in people who have a lung condition called COPD.  How to use a metered dose inhaler without a spacer 1. Remove the cap from the inhaler. 2. If you are using the inhaler for the first time, shake it for 5 seconds, turn it away from your face, then release 4 puffs into the air. This is called priming. 3. Shake the inhaler for 5 seconds. 4. Position the inhaler so the top of the canister faces up. 5. Put your index finger on the top of the medicine canister. Support the bottom of the inhaler with your thumb. 6. Breathe out normally and as completely as possible, away from the inhaler. 7. Either place the inhaler between your teeth and close your lips tightly around the mouthpiece, or hold the inhaler 1-2 inches (2.5-5 cm) away from your open mouth. Keep your tongue down out of the way. If you are  unsure which technique to use, ask your health care provider. 8. Press the canister down with your index finger to release the medicine, then inhale deeply and slowly through your mouth (not your nose) until your lungs are completely filled. Inhaling should take 4-6 seconds. 9. Hold the medicine in your lungs for 5-10 seconds (10 seconds is best). This helps the medicine get into the small airways of your lungs. 10. With your lips in a tight circle (pursed), breathe out slowly. 11. Repeat steps 3-10 until you have taken the number  of puffs that your health care provider directed. Wait about 1 minute between puffs or as directed. 12. Put the cap on the inhaler. 13. If you are using a steroid inhaler, rinse your mouth with water, gargle, and spit out the water. Do not swallow the water. How to use a metered dose inhaler with a spacer 1. Remove the cap from the inhaler. 2. If you are using the inhaler for the first time, shake it for 5 seconds, turn it away from your face, then release 4 puffs into the air. This is called priming. 3. Shake the inhaler for 5 seconds. 4. Place the open end of the spacer onto the inhaler mouthpiece. 5. Position the inhaler so the top of the canister faces up and the spacer mouthpiece faces you. 6. Put your index finger on the top of the medicine canister. Support the bottom of the inhaler and the spacer with your thumb. 7. Breathe out normally and as completely as possible, away from the spacer. 8. Place the spacer between your teeth and close your lips tightly around it. Keep your tongue down out of the way. 9. Press the canister down with your index finger to release the medicine, then inhale deeply and slowly through your mouth (not your nose) until your lungs are completely filled. Inhaling should take 4-6 seconds. 10. Hold the medicine in your lungs for 5-10 seconds (10 seconds is best). This helps the medicine get into the small airways of your lungs. 11. With your lips in a tight circle (pursed), breathe out slowly. 12. Repeat steps 3-11 until you have taken the number of puffs that your health care provider directed. Wait about 1 minute between puffs or as directed. 13. Remove the spacer from the inhaler and put the cap on the inhaler. 14. If you are using a steroid inhaler, rinse your mouth with water, gargle, and spit out the water. Do not swallow the water. Follow these instructions at home:  Take your inhaled medicine only as told by your health care provider. Do not use the inhaler  more than directed by your health care provider.  Keep all follow-up visits as told by your health care provider. This is important.  If your inhaler has a counter, you can check it to determine how full your inhaler is. If your inhaler does not have a counter, ask your health care provider when you will need to refill your inhaler and write the refill date on a calendar or on your inhaler canister. Note that you cannot know when an inhaler is empty by shaking it.  Follow directions on the package insert for care and cleaning of your inhaler and spacer. Contact a health care provider if:  Symptoms are only partially relieved with your inhaler.  You are having trouble using your inhaler.  You have an increase in phlegm.  You have headaches. Get help right away if:  You feel little or no relief  after using your inhaler.  You have dizziness.  You have a fast heart rate.  You have chills or a fever.  You have night sweats.  There is blood in your phlegm. Summary  A metered dose inhaler is a handheld device for taking medicine that must be breathed into the lungs (inhaled).  The medicine is delivered by pushing down on a metal canister to release a preset amount of spray and medicine.  Each device contains the amount of medicine that is needed for a preset number of uses (inhalations). This information is not intended to replace advice given to you by your health care provider. Make sure you discuss any questions you have with your health care provider. Document Released: 05/03/2005 Document Revised: 03/23/2016 Document Reviewed: 03/23/2016 Elsevier Interactive Patient Education  2017 Elsevier Inc.   Cough, Adult Coughing is a reflex that clears your throat and your airways. Coughing helps to heal and protect your lungs. It is normal to cough occasionally, but a cough that happens with other symptoms or lasts a long time may be a sign of a condition that needs treatment. A cough  may last only 2-3 weeks (acute), or it may last longer than 8 weeks (chronic). What are the causes? Coughing is commonly caused by:  Breathing in substances that irritate your lungs.  A viral or bacterial respiratory infection.  Allergies.  Asthma.  Postnasal drip.  Smoking.  Acid backing up from the stomach into the esophagus (gastroesophageal reflux).  Certain medicines.  Chronic lung problems, including COPD (or rarely, lung cancer).  Other medical conditions such as heart failure.  Follow these instructions at home: Pay attention to any changes in your symptoms. Take these actions to help with your discomfort:  Take medicines only as told by your health care provider. ? If you were prescribed an antibiotic medicine, take it as told by your health care provider. Do not stop taking the antibiotic even if you start to feel better. ? Talk with your health care provider before you take a cough suppressant medicine.  Drink enough fluid to keep your urine clear or pale yellow.  If the air is dry, use a cold steam vaporizer or humidifier in your bedroom or your home to help loosen secretions.  Avoid anything that causes you to cough at work or at home.  If your cough is worse at night, try sleeping in a semi-upright position.  Avoid cigarette smoke. If you smoke, quit smoking. If you need help quitting, ask your health care provider.  Avoid caffeine.  Avoid alcohol.  Rest as needed.  Contact a health care provider if:  You have new symptoms.  You cough up pus.  Your cough does not get better after 2-3 weeks, or your cough gets worse.  You cannot control your cough with suppressant medicines and you are losing sleep.  You develop pain that is getting worse or pain that is not controlled with pain medicines.  You have a fever.  You have unexplained weight loss.  You have night sweats. Get help right away if:  You cough up blood.  You have difficulty  breathing.  Your heartbeat is very fast. This information is not intended to replace advice given to you by your health care provider. Make sure you discuss any questions you have with your health care provider. Document Released: 10/30/2010 Document Revised: 10/09/2015 Document Reviewed: 07/10/2014 Elsevier Interactive Patient Education  2018 [employer].   Hypertension Hypertension, commonly called high blood pressure,  is when the force of blood pumping through the arteries is too strong. The arteries are the blood vessels that carry blood from the heart throughout the body. Hypertension forces the heart to work harder to pump blood and may cause arteries to become narrow or stiff. Having untreated or uncontrolled hypertension can cause heart attacks, strokes, kidney disease, and other problems. A blood pressure reading consists of a higher number over a lower number. Ideally, your blood pressure should be below 120/80. The first ("top") number is called the systolic pressure. It is a measure of the pressure in your arteries as your heart beats. The second ("bottom") number is called the diastolic pressure. It is a measure of the pressure in your arteries as the heart relaxes. What are the causes? The cause of this condition is not known. What increases the risk? Some risk factors for high blood pressure are under your control. Others are not. Factors you can change  Smoking.  Having type 2 diabetes mellitus, high cholesterol, or both.  Not getting enough exercise or physical activity.  Being overweight.  Having too much fat, sugar, calories, or salt (sodium) in your diet.  Drinking too much alcohol. Factors that are difficult or impossible to change  Having chronic kidney disease.  Having a family history of high blood pressure.  Age. Risk increases with age.  Race. You may be at higher risk if you are African-American.  Gender. Men are at higher risk than women before age  35. After age 16, women are at higher risk than men.  Having obstructive sleep apnea.  Stress. What are the signs or symptoms? Extremely high blood pressure (hypertensive crisis) may cause:  Headache.  Anxiety.  Shortness of breath.  Nosebleed.  Nausea and vomiting.  Severe chest pain.  Jerky movements you cannot control (seizures).  How is this diagnosed? This condition is diagnosed by measuring your blood pressure while you are seated, with your arm resting on a surface. The cuff of the blood pressure monitor will be placed directly against the skin of your upper arm at the level of your heart. It should be measured at least twice using the same arm. Certain conditions can cause a difference in blood pressure between your right and left arms. Certain factors can cause blood pressure readings to be lower or higher than normal (elevated) for a short period of time:  When your blood pressure is higher when you are in a health care provider's office than when you are at home, this is called white coat hypertension. Most people with this condition do not need medicines.  When your blood pressure is higher at home than when you are in a health care provider's office, this is called masked hypertension. Most people with this condition may need medicines to control blood pressure.  If you have a high blood pressure reading during one visit or you have normal blood pressure with other risk factors:  You may be asked to return on a different day to have your blood pressure checked again.  You may be asked to monitor your blood pressure at home for 1 week or longer.  If you are diagnosed with hypertension, you may have other blood or imaging tests to help your health care provider understand your overall risk for other conditions. How is this treated? This condition is treated by making healthy lifestyle changes, such as eating healthy foods, exercising more, and reducing your alcohol  intake. Your health care provider may prescribe medicine  if lifestyle changes are not enough to get your blood pressure under control, and if:  Your systolic blood pressure is above 130.  Your diastolic blood pressure is above 80.  Your personal target blood pressure may vary depending on your medical conditions, your age, and other factors. Follow these instructions at home: Eating and drinking  Eat a diet that is high in fiber and potassium, and low in sodium, added sugar, and fat. An example eating plan is called the DASH (Dietary Approaches to Stop Hypertension) diet. To eat this way: ? Eat plenty of fresh fruits and vegetables. Try to fill half of your plate at each meal with fruits and vegetables. ? Eat whole grains, such as whole wheat pasta, brown rice, or whole grain bread. Fill about one quarter of your plate with whole grains. ? Eat or drink low-fat dairy products, such as skim milk or low-fat yogurt. ? Avoid fatty cuts of meat, processed or cured meats, and poultry with skin. Fill about one quarter of your plate with lean proteins, such as fish, chicken without skin, beans, eggs, and tofu. ? Avoid premade and processed foods. These tend to be higher in sodium, added sugar, and fat.  Reduce your daily sodium intake. Most people with hypertension should eat less than 1,500 mg of sodium a day.  Limit alcohol intake to no more than 1 drink a day for nonpregnant women and 2 drinks a day for men. One drink equals 12 oz of beer, 5 oz of wine, or 1 oz of hard liquor. Lifestyle  Work with your health care provider to maintain a healthy body weight or to lose weight. Ask what an ideal weight is for you.  Get at least 30 minutes of exercise that causes your heart to beat faster (aerobic exercise) most days of the week. Activities may include walking, swimming, or biking.  Include exercise to strengthen your muscles (resistance exercise), such as pilates or lifting weights, as part of your  weekly exercise routine. Try to do these types of exercises for 30 minutes at least 3 days a week.  Do not use any products that contain nicotine or tobacco, such as cigarettes and e-cigarettes. If you need help quitting, ask your health care provider.  Monitor your blood pressure at home as told by your health care provider.  Keep all follow-up visits as told by your health care provider. This is important. Medicines  Take over-the-counter and prescription medicines only as told by your health care provider. Follow directions carefully. Blood pressure medicines must be taken as prescribed.  Do not skip doses of blood pressure medicine. Doing this puts you at risk for problems and can make the medicine less effective.  Ask your health care provider about side effects or reactions to medicines that you should watch for. Contact a health care provider if:  You think you are having a reaction to a medicine you are taking.  You have headaches that keep coming back (recurring).  You feel dizzy.  You have swelling in your ankles.  You have trouble with your vision. Get help right away if:  You develop a severe headache or confusion.  You have unusual weakness or numbness.  You feel faint.  You have severe pain in your chest or abdomen.  You vomit repeatedly.  You have trouble breathing. Summary  Hypertension is when the force of blood pumping through your arteries is too strong. If this condition is not controlled, it may put you at risk  for serious complications.  Your personal target blood pressure may vary depending on your medical conditions, your age, and other factors. For most people, a normal blood pressure is less than 120/80.  Hypertension is treated with lifestyle changes, medicines, or a combination of both. Lifestyle changes include weight loss, eating a healthy, low-sodium diet, exercising more, and limiting alcohol. This information is not intended to replace  advice given to you by your health care provider. Make sure you discuss any questions you have with your health care provider. Document Released: 05/03/2005 Document Revised: 03/31/2016 Document Reviewed: 03/31/2016 Elsevier Interactive Patient Education  2018 [employer].   IF you received an x-ray today, you will receive an invoice from [facility]. Please contact [facility]iology at [phone] with questions or concerns regarding your invoice.   IF you received labwork today, you will receive an invoice from [facility]. Please contact [facility] at [phone] with questions or concerns regarding your invoice.   Our billing staff will not be able to assist you with questions regarding bills from these companies.  You will be contacted with the lab results as soon as they are available. The fastest way to get your results is to activate your My Chart account. Instructions are located on the last page of this paperwork. If you have not heard from [place] regarding the results in 2 weeks, please contact this office.

## 2017-11-09 LAB — BASIC METABOLIC PANEL
BUN/Creatinine Ratio: 14 (ref 12–28)
BUN: 11 mg/dL (ref 8–27)
CALCIUM: 10.5 mg/dL — AB (ref 8.7–10.3)
CO2: 23 mmol/L (ref 20–29)
Chloride: 97 mmol/L (ref 96–106)
Creatinine, Ser: 0.79 mg/dL (ref 0.57–1.00)
GFR calc Af Amer: 93 mL/min/{1.73_m2} (ref >59)
GFR calc non Af Amer: 81 mL/min/{1.73_m2} (ref >59)
GLUCOSE: 85 mg/dL (ref 65–99)
POTASSIUM: 3.9 mmol/L (ref 3.5–5.2)
Sodium: 139 mmol/L (ref 134–144)

## 2017-11-09 LAB — PRO B NATRIURETIC PEPTIDE: NT-PRO BNP: 69 pg/mL (ref 0–287)

## 2017-11-30 ENCOUNTER — Encounter: Payer: Self-pay | Admitting: *Deleted

## 2017-12-13 ENCOUNTER — Encounter: Payer: Self-pay | Admitting: Obstetrics and Gynecology

## 2017-12-13 ENCOUNTER — Encounter

## 2017-12-13 ENCOUNTER — Ambulatory Visit: Payer: No Typology Code available for payment source | Admitting: Obstetrics and Gynecology

## 2017-12-13 ENCOUNTER — Other Ambulatory Visit: Payer: Self-pay

## 2017-12-13 VITALS — BP 122/82 | HR 64 | Ht 61.81 in | Wt 181.2 lb

## 2017-12-13 DIAGNOSIS — Z1211 Encounter for screening for malignant neoplasm of colon: Secondary | ICD-10-CM

## 2017-12-13 DIAGNOSIS — R2233 Localized swelling, mass and lump, upper limb, bilateral: Secondary | ICD-10-CM

## 2017-12-13 DIAGNOSIS — Z01419 Encounter for gynecological examination (general) (routine) without abnormal findings: Secondary | ICD-10-CM

## 2017-12-13 NOTE — Patient Instructions (Signed)

## 2017-12-13 NOTE — Progress Notes (Signed)
62 y.o. P5. Married Caucasian female here for annual exam.    Has bilateral lumps under her arms.  Assumed them to be lipomas.  Recent mammogram is normal.  Daughter just diagnosed with breast cancer at age 32.  She had genetic testing which was negative.   Her sister had ovarian cancer at age 42.   Using ERT and wondering if she should continue.  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   Has urinary incontinence.  Wears pads all the time.  Leaks with cough, laugh, sneeze, and for no reason.  No bulge. No constipation, splinting, or leakage of stool. No difficulty with sexual functioning - no pain or pressure.   Moved from [place] to [place] to be closer to children.   PCP:   Tinnie Gens, M.D. - [facility].  No LMP recorded. Patient has had a hysterectomy.           Sexually active: Yes.    The current method of family planning is status post hysterectomy.    Exercising: No.  The patient does not participate in regular exercise at present. Smoker:  no  Health Maintenance: Pap:  2015 WNL History of abnormal Pap:  no MMG:  11/04/2017 WNL - BI-RADS1, density rating B.  Colonoscopy:  2014 WNL, repeat in 5 years due to family history of colon cancer BMD:   N/A  Result  N/A TDaP:  Unsure Gardasil:   no HIV:  Labs with PCP Hep C: Labs with PCP Screening Labs: PCP    reports that she has never smoked. She has never used smokeless tobacco. She reports that she drinks alcohol. She reports that she does not use drugs.  Past Medical History:  Diagnosis Date  . HTN (hypertension)   . Urinary incontinence    cough, sneeze    Past Surgical History:  Procedure Laterality Date  . CESAREAN SECTION  5 total  . GALLBLADDER SURGERY    . ROTATOR CUFF REPAIR    . TOTAL ABDOMINAL HYSTERECTOMY  2005   Fibroid ovaries and tubes removed also    Current Outpatient Medications  Medication Sig Dispense Refill  . albuterol (PROVENTIL HFA;VENTOLIN HFA) 108 (90 Base)  MCG/ACT inhaler Inhale 1-2 puffs into the lungs every 4 (four) hours as needed for wheezing or shortness of breath. 1 Inhaler 0  . enalapril (VASOTEC) 10 MG tablet Take 1 tablet (10 mg total) by mouth daily. 90 tablet 1  . estradiol (ESTRACE) 1 MG tablet Take 1 tablet (1 mg total) by mouth daily. 90 tablet 1  . hydrochlorothiazide (HYDRODIURIL) 25 MG tablet Take 1 tablet (25 mg total) by mouth daily. 90 tablet 1  . metoprolol tartrate (LOPRESSOR) 100 MG tablet Take 1 tablet (100 mg total) by mouth 2 (two) times daily. 180 tablet 1   No current facility-administered medications for this visit.     Family History  Problem Relation Age of Onset  . Hyperlipidemia Mother   . Hypertension Mother   . Cancer Father        colon  . Aneurysm Father        brain  . Cancer Sister        ovarian  . Hyperlipidemia Brother   . Breast cancer Daughter     Review of Systems  Constitutional: Negative.   HENT: Negative.   Eyes: Negative.   Respiratory: Negative.   Cardiovascular: Negative.   Gastrointestinal: Negative.   Endocrine: Negative.   Genitourinary:  Lumps in both breasts close to axilla  Musculoskeletal: Negative.   Skin: Negative.   Allergic/Immunologic: Negative.   Neurological: Negative.   Hematological: Negative.   Psychiatric/Behavioral: Negative.   Urinary incontinence.   Exam:   BP 122/82 (BP Location: Right Arm, Patient Position: Sitting)   Pulse 64   Ht 5' 1.81" (1.57 m)   Wt 181 lb 3.2 oz (82.2 kg)   BMI 33.34 kg/m     General appearance: alert, cooperative and appears stated age Head: Normocephalic, without obvious abnormality, atraumatic Neck: no adenopathy, supple, symmetrical, trachea midline and thyroid normal to inspection and palpation Lungs: clear to auscultation bilaterally Breasts: normal appearance, no masses or tenderness, No nipple retraction or dimpling, No nipple discharge or bleeding. Bilateral 1.5 cm axillary soft tissue masses,  nontender. Heart: regular rate and rhythm Abdomen: soft, non-tender; no masses, no organomegaly Extremities: extremities normal, atraumatic, no cyanosis or edema Skin: Skin color, texture, turgor normal. No rashes or lesions Lymph nodes: Cervical, supraclavicular, and axillary nodes normal. No abnormal inguinal nodes palpated Neurologic: Grossly normal  Pelvic: External genitalia:  no lesions              Urethra:  normal appearing urethra with no masses, tenderness or lesions              Bartholins and Skenes: normal                 Vagina: normal appearing vagina with normal color and discharge, no lesions.  Minimal cystocele and rectocele.               Cervix:absent               Pap taken: No. Bimanual Exam:  Uterus:  Absent.              Adnexa: no mass, fullness, tenderness              Rectal exam: Yes.  .  Confirms.              Anus:  normal sphincter tone, no lesions  Chaperone was present for exam.  Assessment:   Well woman visit with normal exam. Status post TAH/BSO.  On ERT.  Bilateral breast axillary masses.  Urinary incontinence.  Mixed.  FH breast, ovarian cancer, and colon cancer.  Daughter negative genetic testing.    Plan: Mammogram  Bilateral dx and bilateral ultrasound of breast/axillary regions.  Recommended self breast awareness. Pap and HR HPV as above. Guidelines for Calcium, Vitamin D, regular exercise program including cardiovascular and weight bearing exercise. Will start to wean off ERT.  She will take 1/2 tab daily 0.5 mg.  We discussed the WHI and potential risks of stroke, DVT, PE, and possible breast cancer. Urinary incontinence reviewed including risk factors and tx options - Rx, PT, Kegel's, Impressa, and surgery. AGOC HO given.   Referral for colonoscopy.  Labs with PCP. Follow up annually and prn.  After visit summary provided.

## 2017-12-13 NOTE — Progress Notes (Signed)
Patient scheduled for bilateral Dx MMG, bilateral US and bilateral axilla US on 12/15/17 at 8:15am at [facility]. Order faxed. Patient verbalizes understanding and is agreeable.

## 2017-12-15 ENCOUNTER — Encounter: Payer: Self-pay | Admitting: Family Medicine

## 2017-12-15 ENCOUNTER — Ambulatory Visit: Payer: No Typology Code available for payment source | Admitting: Family Medicine

## 2017-12-15 ENCOUNTER — Other Ambulatory Visit: Payer: Self-pay

## 2017-12-15 VITALS — BP 142/82 | HR 77 | Temp 98.0°F | Ht 61.0 in | Wt 180.0 lb

## 2017-12-15 DIAGNOSIS — R05 Cough: Secondary | ICD-10-CM | POA: Diagnosis not present

## 2017-12-15 DIAGNOSIS — R059 Cough, unspecified: Secondary | ICD-10-CM

## 2017-12-15 DIAGNOSIS — I10 Essential (primary) hypertension: Secondary | ICD-10-CM

## 2017-12-15 MED ORDER — RANITIDINE HCL 150 MG PO TABS: 150.0000 mg | ORAL_TABLET | Freq: Two times a day (BID) | ORAL | 1 refills | Status: DC

## 2017-12-15 MED ORDER — LOSARTAN POTASSIUM 50 MG PO TABS
50.0000 mg | ORAL_TABLET | Freq: Every day | ORAL | 1 refills | Status: DC
Start: 2017-12-15 — End: 2018-02-26

## 2017-12-15 NOTE — Progress Notes (Signed)
Subjective:    Patient ID: Erika Wolf, female    DOB: 07/12/1955, 62 y.o.   MRN: [id number]  HPI Erika Wolf is a 62 y.o. female Presents today for: Chief Complaint  Patient presents with  . Cough    f/u - going on a couple of months   Here for follow-up of cough.  She was last seen June 25th to establish care with me at that time.  History of hypertension.  She had noted a cough with episodic wheezing at that visit starting approximately 1 month prior feels like her cough had worsened coming off of her metoprolol and had reported some improvement of cough restarting of metoprolol.  Had reported a history of exercise-induced asthma but no regular inhaler use.  Did note some shortness of breath with walking uphill starting 2 weeks prior but no known history of heart disease or congestive heart failure.  No chest pains at that time.  She was using Flonase as well as over-the-counter antihistamine for her seasonal allergies which have been reportedly controlled.  No history of smoking.  Denied any heartburn, fever, weight changes or lower extremity swelling at that visit.    Cough was thought to be due to multiple causes including postnasal drip from allergies but chest x-ray did indicate minimal linear atelectasis or scarring at the left  base as well as minimal bronchitic changes.  Started on azithromycin for possible infectious cause, Mucinex if needed, continued on allergy treatments.  Blood pressure was slightly elevated, and her enalapril was increased (had been on enalapril for 5-10 years prior).  Additionally prescribed albuterol as needed for wheezing.  Pro BNP was normal.  Feels like cough improved a few weeks after the antibiotic, then returned. Cough worse at night.  Clear phlegm. No fever. Albuterol helped wheeze but not cough, including when tried at night. Rare wheezing - once every few weeks. No new chest pain. still using flonase 2 puffs per day per nostril and zyrtec generic  daily.  Feels like cough started after viral infection in the spring. No heartburn but cough is worse when lying down.   No hx of tobacco use or exposure.    Patient Active Problem List   Diagnosis Date Noted  . History of hematuria 12/27/2013  . Essential hypertension 12/27/2013  . Surgical menopause on hormone replacement therapy 12/27/2013  . Obesity (BMI 30.0-34.9) 12/27/2013   Past Medical History:  Diagnosis Date  . HTN (hypertension)   . Urinary incontinence    cough, sneeze   Past Surgical History:  Procedure Laterality Date  . CESAREAN SECTION  5 total  . GALLBLADDER SURGERY    . ROTATOR CUFF REPAIR    . TOTAL ABDOMINAL HYSTERECTOMY  2005   Fibroid ovaries and tubes removed also   No Known Allergies Prior to Admission medications   Medication Sig Start Date End Date Taking? Authorizing Provider  albuterol (PROVENTIL HFA;VENTOLIN HFA) 108 (90 Base) MCG/ACT inhaler Inhale 1-2 puffs into the lungs every 4 (four) hours as needed for wheezing or shortness of breath. 11/08/17  Yes Shade FloodGreene, Kinnley Paulson R, MD  cetirizine (ZYRTEC) 10 MG tablet Take 10 mg by mouth daily.   Yes [provider]  enalapril (VASOTEC) 10 MG tablet Take 1 tablet (10 mg total) by mouth daily. 11/08/17  Yes Shade FloodGreene, Tannis Burstein R, MD  estradiol (ESTRACE) 1 MG tablet Take 1 tablet (1 mg total) by mouth daily. 11/08/17  Yes Shade FloodGreene, Josha Weekley R, MD  fluticasone (FLONASE) 50 MCG/ACT nasal spray  Place into both nostrils daily.   Yes [provider]  hydrochlorothiazide (HYDRODIURIL) 25 MG tablet Take 1 tablet (25 mg total) by mouth daily. 11/08/17  Yes Shade Flood, MD  metoprolol tartrate (LOPRESSOR) 100 MG tablet Take 1 tablet (100 mg total) by mouth 2 (two) times daily. 11/08/17  Yes Shade Flood, MD   Social History   Socioeconomic History  . Marital status: Married    Spouse name: Not on file  . Number of children: Not on file  . Years of education: Not on file  . Highest education  level: Not on file  Occupational History  . Occupation: homemaker  Social Needs  . Financial resource strain: Not on file  . Food insecurity:    Worry: Not on file    Inability: Not on file  . Transportation needs:    Medical: Not on file    Non-medical: Not on file  Tobacco Use  . Smoking status: Never Smoker  . Smokeless tobacco: Never Used  Substance and Sexual Activity  . Alcohol use: Yes    Comment: very rarely  . Drug use: No  . Sexual activity: Yes    Birth control/protection: Surgical    Comment: hysterectomy  Lifestyle  . Physical activity:    Days per week: Not on file    Minutes per session: Not on file  . Stress: Not on file  Relationships  . Social connections:    Talks on phone: Not on file    Gets together: Not on file    Attends religious service: Not on file    Active member of club or organization: Not on file    Attends meetings of clubs or organizations: Not on file    Relationship status: Not on file  . Intimate partner violence:    Fear of current or ex partner: Not on file    Emotionally abused: Not on file    Physically abused: Not on file    Forced sexual activity: Not on file  Other Topics Concern  . Not on file  Social History Narrative   Married;   5 children, 4 live in [place], 1 lives in [place].   2 grandchildren          Review of Systems  Constitutional: Negative for chills and fever.  HENT: Positive for postnasal drip (min).   Respiratory: Positive for cough. Negative for shortness of breath and wheezing.   Cardiovascular: Negative for chest pain.       Objective:   Physical Exam  Constitutional: She is oriented to person, place, and time. She appears well-developed and well-nourished. No distress.  HENT:  Head: Normocephalic and atraumatic.  Right Ear: Hearing, tympanic membrane, external ear and ear canal normal.  Left Ear: Hearing, tympanic membrane, external ear and ear canal normal.  Nose: Nose normal.    Mouth/Throat: Oropharynx is clear and moist. No oropharyngeal exudate.  Eyes: Pupils are equal, round, and reactive to light. Conjunctivae and EOM are normal.  Cardiovascular: Normal rate, regular rhythm, normal heart sounds and intact distal pulses.  No murmur heard. Pulmonary/Chest: Effort normal and breath sounds normal. No respiratory distress. She has no wheezes. She has no rhonchi.  Neurological: She is alert and oriented to person, place, and time.  Skin: Skin is warm and dry. No rash noted.  Psychiatric: She has a normal mood and affect. Her behavior is normal.  Vitals reviewed.   Vitals:   12/15/17 1358 12/15/17 1402  BP: Marland Kitchen)  162/88 (!) 142/82  Pulse: 77   Temp: 98 F (36.7 C)   TempSrc: Oral   SpO2: 95%   Weight: 180 lb (81.6 kg)   Height: 5\' 1"  (1.549 m)        Assessment & Plan:    Erika Wolf is a 62 y.o. female Cough - Plan: losartan (COZAAR) 50 MG tablet, ranitidine (ZANTAC) 150 MG tablet  Essential hypertension - Plan: losartan (COZAAR) 50 MG tablet  -Persistent cough, other causes discussed.  Will stop ACE inhibitor, start losartan 50 mg daily with option of increasing to 100 mg if persistent elevated readings.  Start Zantac for possible laryngeal pharyngeal reflux, and upper airway cough syndrome.  Continue allergy treatment and recheck in 3 to 4 weeks.  Albuterol only if needed as did not appear to make significant difference in symptoms.  RTC precautions if worsening symptoms.  Meds ordered this encounter  Medications  . losartan (COZAAR) 50 MG tablet    Sig: Take 1 tablet (50 mg total) by mouth daily.    Dispense:  30 tablet    Refill:  1  . ranitidine (ZANTAC) 150 MG tablet    Sig: Take 1 tablet (150 mg total) by mouth 2 (two) times daily.    Dispense:  60 tablet    Refill:  1   Patient Instructions     For cough, stop enalapril as ACE inhibitor sometimes can cause cough.  Start losartan 50 mg once per day, monitor home blood pressure readings  and in the next week if those are remaining over 140/90, increase that to 2 pills/day or 100 mg.  For possible heartburn cause, start Zantac 150 mg twice per day.  Continue Flonase and Zyrtec.  Albuterol only if needed for wheezing  Recheck in the next 2 to 3 weeks for cough and blood pressure.  Please follow-up sooner if any chest pains or worsening symptoms.  Cough, Adult Coughing is a reflex that clears your throat and your airways. Coughing helps to heal and protect your lungs. It is normal to cough occasionally, but a cough that happens with other symptoms or lasts a long time may be a sign of a condition that needs treatment. A cough may last only 2-3 weeks (acute), or it may last longer than 8 weeks (chronic). What are the causes? Coughing is commonly caused by:  Breathing in substances that irritate your lungs.  A viral or bacterial respiratory infection.  Allergies.  Asthma.  Postnasal drip.  Smoking.  Acid backing up from the stomach into the esophagus (gastroesophageal reflux).  Certain medicines.  Chronic lung problems, including COPD (or rarely, lung cancer).  Other medical conditions such as heart failure.  Follow these instructions at home: Pay attention to any changes in your symptoms. Take these actions to help with your discomfort:  Take medicines only as told by your health care provider. ? If you were prescribed an antibiotic medicine, take it as told by your health care provider. Do not stop taking the antibiotic even if you start to feel better. ? Talk with your health care provider before you take a cough suppressant medicine.  Drink enough fluid to keep your urine clear or pale yellow.  If the air is dry, use a cold steam vaporizer or humidifier in your bedroom or your home to help loosen secretions.  Avoid anything that causes you to cough at work or at home.  If your cough is worse at night, try sleeping in a semi-upright position.  Avoid  cigarette smoke. If you smoke, quit smoking. If you need help quitting, ask your health care provider.  Avoid caffeine.  Avoid alcohol.  Rest as needed.  Contact a health care provider if:  You have new symptoms.  You cough up pus.  Your cough does not get better after 2-3 weeks, or your cough gets worse.  You cannot control your cough with suppressant medicines and you are losing sleep.  You develop pain that is getting worse or pain that is not controlled with pain medicines.  You have a fever.  You have unexplained weight loss.  You have night sweats. Get help right away if:  You cough up blood.  You have difficulty breathing.  Your heartbeat is very fast. This information is not intended to replace advice given to you by your health care provider. Make sure you discuss any questions you have with your health care provider. Document Released: 10/30/2010 Document Revised: 10/09/2015 Document Reviewed: 07/10/2014 Elsevier Interactive Patient Education  2018 [employer].   How to Take Your Blood Pressure You can take your blood pressure at home with a machine. You may need to check your blood pressure at home:  To check if you have high blood pressure (hypertension).  To check your blood pressure over time.  To make sure your blood pressure medicine is working.  Supplies needed: You will need a blood pressure machine, or monitor. You can buy one at a drugstore or online. When choosing one:  Choose one with an arm cuff.  Choose one that wraps around your upper arm. Only one finger should fit between your arm and the cuff.  Do not choose one that measures your blood pressure from your wrist or finger.  Your doctor can suggest a monitor. How to prepare Avoid these things for 30 minutes before checking your blood pressure:  Drinking caffeine.  Drinking alcohol.  Eating.  Smoking.  Exercising.  Five minutes before checking your blood  pressure:  Pee.  Sit in a dining chair. Avoid sitting in a soft couch or armchair.  Be quiet. Do not talk.  How to take your blood pressure Follow the instructions that came with your machine. If you have a digital blood pressure monitor, these may be the instructions: 1. Sit up straight. 2. Place your feet on the floor. Do not cross your ankles or legs. 3. Rest your left arm at the level of your heart. You may rest it on a table, desk, or chair. 4. Pull up your shirt sleeve. 5. Wrap the blood pressure cuff around the upper part of your left arm. The cuff should be 1 inch (2.5 cm) above your elbow. It is best to wrap the cuff around bare skin. 6. Fit the cuff snugly around your arm. You should be able to place only one finger between the cuff and your arm. 7. Put the cord inside the groove of your elbow. 8. Press the power button. 9. Sit quietly while the cuff fills with air and loses air. 10. Write down the numbers on the screen. 11. Wait 2-3 minutes and then repeat steps 1-10.  What do the numbers mean? Two numbers make up your blood pressure. The first number is called systolic pressure. The second is called diastolic pressure. An example of a blood pressure reading is "120 over 80" (or 120/80). If you are an adult and do not have a medical condition, use this guide to find out if your blood pressure is normal:  Normal  First number: below 120.  Second number: below 80. Elevated  First number: 120-129.  Second number: below 80. Hypertension stage 1  First number: 130-139.  Second number: 80-89. Hypertension stage 2  First number: 140 or above.  Second number: 90 or above. Your blood pressure is above normal even if only the top or bottom number is above normal. Follow these instructions at home:  Check your blood pressure as often as your doctor tells you to.  Take your monitor to your next doctor's appointment. Your doctor will: ? Make sure you are using it  correctly. ? Make sure it is working right.  Make sure you understand what your blood pressure numbers should be.  Tell your doctor if your medicines are causing side effects. Contact a doctor if:  Your blood pressure keeps being high. Get help right away if:  Your first blood pressure number is higher than 180.  Your second blood pressure number is higher than 120. This information is not intended to replace advice given to you by your health care provider. Make sure you discuss any questions you have with your health care provider. Document Released: 04/15/2008 Document Revised: 03/31/2016 Document Reviewed: 10/10/2015 Elsevier Interactive Patient Education  2018 [employer].   IF you received an x-ray today, you will receive an invoice from [facility]. Please contact [facility] at [phone] with questions or concerns regarding your invoice.   IF you received labwork today, you will receive an invoice from [facility]. Please contact [facility] at [phone] with questions or concerns regarding your invoice.   Our billing staff will not be able to assist you with questions regarding bills from these companies.  You will be contacted with the lab results as soon as they are available. The fastest way to get your results is to activate your My Chart account. Instructions are located on the last page of this paperwork. If you have not heard from [place] regarding the results in 2 weeks, please contact this office.      Signed,   Meredith Staggers, MD Primary Care at [facility].  12/18/17 12:09 PM

## 2017-12-15 NOTE — Patient Instructions (Addendum)
For cough, stop enalapril as ACE inhibitor sometimes can cause cough.  Start losartan 50 mg once per day, monitor home blood pressure readings and in the next week if those are remaining over 140/90, increase that to 2 pills/day or 100 mg.  For possible heartburn cause, start Zantac 150 mg twice per day.  Continue Flonase and Zyrtec.  Albuterol only if needed for wheezing  Recheck in the next 2 to 3 weeks for cough and blood pressure.  Please follow-up sooner if any chest pains or worsening symptoms.  Cough, Adult Coughing is a reflex that clears your throat and your airways. Coughing helps to heal and protect your lungs. It is normal to cough occasionally, but a cough that happens with other symptoms or lasts a long time may be a sign of a condition that needs treatment. A cough may last only 2-3 weeks (acute), or it may last longer than 8 weeks (chronic). What are the causes? Coughing is commonly caused by:  Breathing in substances that irritate your lungs.  A viral or bacterial respiratory infection.  Allergies.  Asthma.  Postnasal drip.  Smoking.  Acid backing up from the stomach into the esophagus (gastroesophageal reflux).  Certain medicines.  Chronic lung problems, including COPD (or rarely, lung cancer).  Other medical conditions such as heart failure.  Follow these instructions at home: Pay attention to any changes in your symptoms. Take these actions to help with your discomfort:  Take medicines only as told by your health care provider. ? If you were prescribed an antibiotic medicine, take it as told by your health care provider. Do not stop taking the antibiotic even if you start to feel better. ? Talk with your health care provider before you take a cough suppressant medicine.  Drink enough fluid to keep your urine clear or pale yellow.  If the air is dry, use a cold steam vaporizer or humidifier in your bedroom or your home to help loosen secretions.  Avoid  anything that causes you to cough at work or at home.  If your cough is worse at night, try sleeping in a semi-upright position.  Avoid cigarette smoke. If you smoke, quit smoking. If you need help quitting, ask your health care provider.  Avoid caffeine.  Avoid alcohol.  Rest as needed.  Contact a health care provider if:  You have new symptoms.  You cough up pus.  Your cough does not get better after 2-3 weeks, or your cough gets worse.  You cannot control your cough with suppressant medicines and you are losing sleep.  You develop pain that is getting worse or pain that is not controlled with pain medicines.  You have a fever.  You have unexplained weight loss.  You have night sweats. Get help right away if:  You cough up blood.  You have difficulty breathing.  Your heartbeat is very fast. This information is not intended to replace advice given to you by your health care provider. Make sure you discuss any questions you have with your health care provider. Document Released: 10/30/2010 Document Revised: 10/09/2015 Document Reviewed: 07/10/2014 Elsevier Interactive Patient Education  2018 [employer]   How to Take Your Blood Pressure You can take your blood pressure at home with a machine. You may need to check your blood pressure at home:  To check if you have high blood pressure (hypertension).  To check your blood pressure over time.  To make sure your blood pressure medicine is working.  Supplies needed: You will need a blood pressure machine, or monitor. You can buy one at a drugstore or online. When choosing one:  Choose one with an arm cuff.  Choose one that wraps around your upper arm. Only one finger should fit between your arm and the cuff.  Do not choose one that measures your blood pressure from your wrist or finger.  Your doctor can suggest a monitor. How to prepare Avoid these things for 30 minutes before checking your blood  pressure:  Drinking caffeine.  Drinking alcohol.  Eating.  Smoking.  Exercising.  Five minutes before checking your blood pressure:  Pee.  Sit in a dining chair. Avoid sitting in a soft couch or armchair.  Be quiet. Do not talk.  How to take your blood pressure Follow the instructions that came with your machine. If you have a digital blood pressure monitor, these may be the instructions: 1. Sit up straight. 2. Place your feet on the floor. Do not cross your ankles or legs. 3. Rest your left arm at the level of your heart. You may rest it on a table, desk, or chair. 4. Pull up your shirt sleeve. 5. Wrap the blood pressure cuff around the upper part of your left arm. The cuff should be 1 inch (2.5 cm) above your elbow. It is best to wrap the cuff around bare skin. 6. Fit the cuff snugly around your arm. You should be able to place only one finger between the cuff and your arm. 7. Put the cord inside the groove of your elbow. 8. Press the power button. 9. Sit quietly while the cuff fills with air and loses air. 10. Write down the numbers on the screen. 11. Wait 2-3 minutes and then repeat steps 1-10.  What do the numbers mean? Two numbers make up your blood pressure. The first number is called systolic pressure. The second is called diastolic pressure. An example of a blood pressure reading is "120 over 80" (or 120/80). If you are an adult and do not have a medical condition, use this guide to find out if your blood pressure is normal: Normal  First number: below 120.  Second number: below 80. Elevated  First number: 120-129.  Second number: below 80. Hypertension stage 1  First number: 130-139.  Second number: 80-89. Hypertension stage 2  First number: 140 or above.  Second number: 90 or above. Your blood pressure is above normal even if only the top or bottom number is above normal. Follow these instructions at home:  Check your blood pressure as often as  your doctor tells you to.  Take your monitor to your next doctor's appointment. Your doctor will: ? Make sure you are using it correctly. ? Make sure it is working right.  Make sure you understand what your blood pressure numbers should be.  Tell your doctor if your medicines are causing side effects. Contact a doctor if:  Your blood pressure keeps being high. Get help right away if:  Your first blood pressure number is higher than 180.  Your second blood pressure number is higher than 120. This information is not intended to replace advice given to you by your health care provider. Make sure you discuss any questions you have with your health care provider. Document Released: 04/15/2008 Document Revised: 03/31/2016 Document Reviewed: 10/10/2015 Elsevier Interactive Patient Education  2018 [employer].   IF you received an x-ray today, you will receive an invoice from [facility]. Please contact  [facility] at [phone] with questions or concerns regarding your invoice.   IF you received labwork today, you will receive an invoice from [facility]. Please contact [facility] at [phone] with questions or concerns regarding your invoice.   Our billing staff will not be able to assist you with questions regarding bills from these companies.  You will be contacted with the lab results as soon as they are available. The fastest way to get your results is to activate your My Chart account. Instructions are located on the last page of this paperwork. If you have not heard from [place] regarding the results in 2 weeks, please contact this office.

## 2017-12-29 ENCOUNTER — Encounter: Payer: Self-pay | Admitting: Obstetrics and Gynecology

## 2018-01-05 ENCOUNTER — Ambulatory Visit (INDEPENDENT_AMBULATORY_CARE_PROVIDER_SITE_OTHER): Payer: No Typology Code available for payment source | Admitting: Family Medicine

## 2018-01-05 ENCOUNTER — Encounter: Payer: Self-pay | Admitting: Family Medicine

## 2018-01-05 ENCOUNTER — Other Ambulatory Visit: Payer: Self-pay

## 2018-01-05 VITALS — BP 138/92 | HR 62 | Temp 98.1°F | Ht 61.0 in | Wt 184.6 lb

## 2018-01-05 DIAGNOSIS — R05 Cough: Secondary | ICD-10-CM

## 2018-01-05 DIAGNOSIS — I10 Essential (primary) hypertension: Secondary | ICD-10-CM | POA: Diagnosis not present

## 2018-01-05 DIAGNOSIS — R059 Cough, unspecified: Secondary | ICD-10-CM

## 2018-01-05 NOTE — Patient Instructions (Addendum)
Based on timing of improvement of symptoms, I am still suspicious of possible heartburn as a cause or contributor to your cough.  Continue Zantac at bedtime, see list of foods to avoid below and as symptoms improve can try coming off of Zantac in the next few weeks.  Additionally can decrease allergy medications over the course of the next few weeks if symptoms have improved as well.  You can try to restart the enalapril 10 mg as that was better tolerated than losartan. Stop losartan for now.   If cough comes back - STOP enalapril and to go back to losartan but take both pills in the morning (losartan 100mg  each morning) to see if that lessens dizziness at night. See other information on hypertension below.    Monitor your home readings and let me know with a message on my chart in the next 2 weeks how you are doing including blood pressures and if cough has returned. Thank you for coming in today.  Return to the clinic or go to the nearest emergency room if any of your symptoms worsen or new symptoms occur.   Hypertension Hypertension, commonly called high blood pressure, is when the force of blood pumping through the arteries is too strong. The arteries are the blood vessels that carry blood from the heart throughout the body. Hypertension forces the heart to work harder to pump blood and may cause arteries to become narrow or stiff. Having untreated or uncontrolled hypertension can cause heart attacks, strokes, kidney disease, and other problems. A blood pressure reading consists of a higher number over a lower number. Ideally, your blood pressure should be below 120/80. The first ("top") number is called the systolic pressure. It is a measure of the pressure in your arteries as your heart beats. The second ("bottom") number is called the diastolic pressure. It is a measure of the pressure in your arteries as the heart relaxes. What are the causes? The cause of this condition is not known. What  increases the risk? Some risk factors for high blood pressure are under your control. Others are not. Factors you can change  Smoking.  Having type 2 diabetes mellitus, high cholesterol, or both.  Not getting enough exercise or physical activity.  Being overweight.  Having too much fat, sugar, calories, or salt (sodium) in your diet.  Drinking too much alcohol. Factors that are difficult or impossible to change  Having chronic kidney disease.  Having a family history of high blood pressure.  Age. Risk increases with age.  Race. You may be at higher risk if you are African-American.  Gender. Men are at higher risk than women before age 62. After age 62, women are at higher risk than men.  Having obstructive sleep apnea.  Stress. What are the signs or symptoms? Extremely high blood pressure (hypertensive crisis) may cause:  Headache.  Anxiety.  Shortness of breath.  Nosebleed.  Nausea and vomiting.  Severe chest pain.  Jerky movements you cannot control (seizures).  How is this diagnosed? This condition is diagnosed by measuring your blood pressure while you are seated, with your arm resting on a surface. The cuff of the blood pressure monitor will be placed directly against the skin of your upper arm at the level of your heart. It should be measured at least twice using the same arm. Certain conditions can cause a difference in blood pressure between your right and left arms. Certain factors can cause blood pressure readings to be lower or  higher than normal (elevated) for a short period of time:  When your blood pressure is higher when you are in a health care provider's office than when you are at home, this is called white coat hypertension. Most people with this condition do not need medicines.  When your blood pressure is higher at home than when you are in a health care provider's office, this is called masked hypertension. Most people with this condition may  need medicines to control blood pressure.  If you have a high blood pressure reading during one visit or you have normal blood pressure with other risk factors:  You may be asked to return on a different day to have your blood pressure checked again.  You may be asked to monitor your blood pressure at home for 1 week or longer.  If you are diagnosed with hypertension, you may have other blood or imaging tests to help your health care provider understand your overall risk for other conditions. How is this treated? This condition is treated by making healthy lifestyle changes, such as eating healthy foods, exercising more, and reducing your alcohol intake. Your health care provider may prescribe medicine if lifestyle changes are not enough to get your blood pressure under control, and if:  Your systolic blood pressure is above 130.  Your diastolic blood pressure is above 80.  Your personal target blood pressure may vary depending on your medical conditions, your age, and other factors. Follow these instructions at home: Eating and drinking  Eat a diet that is high in fiber and potassium, and low in sodium, added sugar, and fat. An example eating plan is called the DASH (Dietary Approaches to Stop Hypertension) diet. To eat this way: ? Eat plenty of fresh fruits and vegetables. Try to fill half of your plate at each meal with fruits and vegetables. ? Eat whole grains, such as whole wheat pasta, brown rice, or whole grain bread. Fill about one quarter of your plate with whole grains. ? Eat or drink low-fat dairy products, such as skim milk or low-fat yogurt. ? Avoid fatty cuts of meat, processed or cured meats, and poultry with skin. Fill about one quarter of your plate with lean proteins, such as fish, chicken without skin, beans, eggs, and tofu. ? Avoid premade and processed foods. These tend to be higher in sodium, added sugar, and fat.  Reduce your daily sodium intake. Most people with  hypertension should eat less than 1,500 mg of sodium a day.  Limit alcohol intake to no more than 1 drink a day for nonpregnant women and 2 drinks a day for men. One drink equals 12 oz of beer, 5 oz of wine, or 1 oz of hard liquor. Lifestyle  Work with your health care provider to maintain a healthy body weight or to lose weight. Ask what an ideal weight is for you.  Get at least 30 minutes of exercise that causes your heart to beat faster (aerobic exercise) most days of the week. Activities may include walking, swimming, or biking.  Include exercise to strengthen your muscles (resistance exercise), such as pilates or lifting weights, as part of your weekly exercise routine. Try to do these types of exercises for 30 minutes at least 3 days a week.  Do not use any products that contain nicotine or tobacco, such as cigarettes and e-cigarettes. If you need help quitting, ask your health care provider.  Monitor your blood pressure at home as told by your health care provider.  Keep all follow-up visits as told by your health care provider. This is important. Medicines  Take over-the-counter and prescription medicines only as told by your health care provider. Follow directions carefully. Blood pressure medicines must be taken as prescribed.  Do not skip doses of blood pressure medicine. Doing this puts you at risk for problems and can make the medicine less effective.  Ask your health care provider about side effects or reactions to medicines that you should watch for. Contact a health care provider if:  You think you are having a reaction to a medicine you are taking.  You have headaches that keep coming back (recurring).  You feel dizzy.  You have swelling in your ankles.  You have trouble with your vision. Get help right away if:  You develop a severe headache or confusion.  You have unusual weakness or numbness.  You feel faint.  You have severe pain in your chest or  abdomen.  You vomit repeatedly.  You have trouble breathing. Summary  Hypertension is when the force of blood pumping through your arteries is too strong. If this condition is not controlled, it may put you at risk for serious complications.  Your personal target blood pressure may vary depending on your medical conditions, your age, and other factors. For most people, a normal blood pressure is less than 120/80.  Hypertension is treated with lifestyle changes, medicines, or a combination of both. Lifestyle changes include weight loss, eating a healthy, low-sodium diet, exercising more, and limiting alcohol. This information is not intended to replace advice given to you by your health care provider. Make sure you discuss any questions you have with your health care provider. Document Released: 05/03/2005 Document Revised: 03/31/2016 Document Reviewed: 03/31/2016 Elsevier Interactive Patient Education  2018 [employer].  Food Choices for Gastroesophageal Reflux Disease, Adult When you have gastroesophageal reflux disease (GERD), the foods you eat and your eating habits are very important. Choosing the right foods can help ease your discomfort. What guidelines do I need to follow?  Choose fruits, vegetables, whole grains, and low-fat dairy products.  Choose low-fat meat, fish, and poultry.  Limit fats such as oils, salad dressings, butter, nuts, and avocado.  Keep a food diary. This helps you identify foods that cause symptoms.  Avoid foods that cause symptoms. These may be different for everyone.  Eat small meals often instead of 3 large meals a day.  Eat your meals slowly, in a place where you are relaxed.  Limit fried foods.  Cook foods using methods other than frying.  Avoid drinking alcohol.  Avoid drinking large amounts of liquids with your meals.  Avoid bending over or lying down until 2-3 hours after eating. What foods are not recommended? These are some foods and  drinks that may make your symptoms worse: Vegetables Tomatoes. Tomato juice. Tomato and spaghetti sauce. Chili peppers. Onion and garlic. Horseradish. Fruits Oranges, grapefruit, and lemon (fruit and juice). Meats High-fat meats, fish, and poultry. This includes hot dogs, ribs, ham, sausage, salami, and bacon. Dairy Whole milk and chocolate milk. Sour cream. Cream. Butter. Ice cream. Cream cheese. Drinks Coffee and tea. Bubbly (carbonated) drinks or energy drinks. Condiments Hot sauce. Barbecue sauce. Sweets/Desserts Chocolate and cocoa. Donuts. Peppermint and spearmint. Fats and Oils High-fat foods. This includes [place] fries and potato chips. Other Vinegar. Strong spices. This includes black pepper, white pepper, red pepper, cayenne, curry powder, cloves, ginger, and chili powder. The items listed above may not be a complete list  of foods and drinks to avoid. Contact your dietitian for more information. This information is not intended to replace advice given to you by your health care provider. Make sure you discuss any questions you have with your health care provider. Document Released: 11/02/2011 Document Revised: 10/09/2015 Document Reviewed: 03/07/2013 Elsevier Interactive Patient Education  [employer].     If you have lab work done today you will be contacted with your lab results within the next 2 weeks.  If you have not heard from [place] then please contact us. The fastest way to get your results is to register for My Chart.   IF you received an x-ray today, you will receive an invoice from [facility]. Please contact [facility] at [phone] with questions or concerns regarding your invoice.   IF you received labwork today, you will receive an invoice from [facility]. Please contact [facility] at [phone] with questions or concerns regarding your invoice.   Our billing staff will not be able to assist you with questions regarding bills from these  companies.  You will be contacted with the lab results as soon as they are available. The fastest way to get your results is to activate your My Chart account. Instructions are located on the last page of this paperwork. If you have not heard from [place] regarding the results in 2 weeks, please contact this office.

## 2018-01-05 NOTE — Progress Notes (Signed)
Subjective:  By signing my name below, I, Essence Howell, attest that this documentation has been prepared under the direction and in the presence of Shade Flood, MD Electronically Signed: Charline Bills, ED Scribe 01/05/2018 at 11:39 AM.   Patient ID: Saddie Benders, female    DOB: April 26, 1956, 62 y.o.   MRN: [id number]  Chief Complaint  Patient presents with  . medication change f/u    bP med   HPI Saretta Dahlem is a 62 y.o. female who presents to Primary Care at [facility] for f/u. Last seen 8/1 for f/u of cough at that time, thought to be due to a number of causes, however she was still on enalapril which she had been on for yrs but increased dose for BP control. Decided to stop ACE as potential cause, started Losartan 50 mg, option of 100 mg, started zantac for poss LPR contribution of upper airway cough and continued Flonase and zyrtec for allergies. - Pt states that cough improved almost immediately with Losartan and zantac. She has noticed wooziness with standing in the middle of the night on new BP meds. Takes Losartan 50 mg bid with the second dose around 9-10 PM. Single day low BP reading of 109/83, averaging 156-158/94, 140s-150s/80s-90s over the past few days. Denies cp, lightheadedness/dizziness during the day, fall due to lightheadedness.  Patient Active Problem List   Diagnosis Date Noted  . History of hematuria 12/27/2013  . Essential hypertension 12/27/2013  . Surgical menopause on hormone replacement therapy 12/27/2013  . Obesity (BMI 30.0-34.9) 12/27/2013   Past Medical History:  Diagnosis Date  . HTN (hypertension)   . Urinary incontinence    cough, sneeze   Past Surgical History:  Procedure Laterality Date  . CESAREAN SECTION  5 total  . GALLBLADDER SURGERY    . ROTATOR CUFF REPAIR    . TOTAL ABDOMINAL HYSTERECTOMY  2005   Fibroid ovaries and tubes removed also   No Known Allergies Prior to Admission medications   Medication Sig Start Date End Date Taking?  Authorizing Provider  albuterol (PROVENTIL HFA;VENTOLIN HFA) 108 (90 Base) MCG/ACT inhaler Inhale 1-2 puffs into the lungs every 4 (four) hours as needed for wheezing or shortness of breath. 11/08/17   Shade Flood, MD  cetirizine (ZYRTEC) 10 MG tablet Take 10 mg by mouth daily.    [provider]  estradiol (ESTRACE) 1 MG tablet Take 1 tablet (1 mg total) by mouth daily. 11/08/17   Shade Flood, MD  fluticasone (FLONASE) 50 MCG/ACT nasal spray Place into both nostrils daily.    [provider]  hydrochlorothiazide (HYDRODIURIL) 25 MG tablet Take 1 tablet (25 mg total) by mouth daily. 11/08/17   Shade Flood, MD  losartan (COZAAR) 50 MG tablet Take 1 tablet (50 mg total) by mouth daily. 12/15/17   Shade Flood, MD  metoprolol tartrate (LOPRESSOR) 100 MG tablet Take 1 tablet (100 mg total) by mouth 2 (two) times daily. 11/08/17   Shade Flood, MD  ranitidine (ZANTAC) 150 MG tablet Take 1 tablet (150 mg total) by mouth 2 (two) times daily. 12/15/17   Shade Flood, MD   Social History   Socioeconomic History  . Marital status: Married    Spouse name: Not on file  . Number of children: Not on file  . Years of education: Not on file  . Highest education level: Not on file  Occupational History  . Occupation: homemaker  Social Needs  . Financial resource strain:  Not on file  . Food insecurity:    Worry: Not on file    Inability: Not on file  . Transportation needs:    Medical: Not on file    Non-medical: Not on file  Tobacco Use  . Smoking status: Never Smoker  . Smokeless tobacco: Never Used  Substance and Sexual Activity  . Alcohol use: Yes    Comment: very rarely  . Drug use: No  . Sexual activity: Yes    Birth control/protection: Surgical    Comment: hysterectomy  Lifestyle  . Physical activity:    Days per week: Not on file    Minutes per session: Not on file  . Stress: Not on file  Relationships  . Social connections:    Talks on  phone: Not on file    Gets together: Not on file    Attends religious service: Not on file    Active member of club or organization: Not on file    Attends meetings of clubs or organizations: Not on file    Relationship status: Not on file  . Intimate partner violence:    Fear of current or ex partner: Not on file    Emotionally abused: Not on file    Physically abused: Not on file    Forced sexual activity: Not on file  Other Topics Concern  . Not on file  Social History Narrative   Married;   5 children, 4 live in [place], 1 lives in [place].   2 grandchildren         Review of Systems  Constitutional: Negative for fatigue and unexpected weight change.  Respiratory: Negative for cough (resolved), chest tightness and shortness of breath.   Cardiovascular: Negative for chest pain, palpitations and leg swelling.  Gastrointestinal: Negative for abdominal pain and blood in stool.  Neurological: Positive for light-headedness (at night). Negative for dizziness, syncope and headaches.      Objective:   Physical Exam  Constitutional: She is oriented to person, place, and time. She appears well-developed and well-nourished. No distress.  HENT:  Head: Normocephalic and atraumatic.  Right Ear: Hearing, tympanic membrane, external ear and ear canal normal.  Left Ear: Hearing, tympanic membrane, external ear and ear canal normal.  Nose: Nose normal.  Mouth/Throat: Oropharynx is clear and moist. No oropharyngeal exudate.  Eyes: Pupils are equal, round, and reactive to light. Conjunctivae and EOM are normal.  Neck: Carotid bruit is not present.  Cardiovascular: Normal rate, regular rhythm, normal heart sounds and intact distal pulses.  No murmur heard. Pulmonary/Chest: Effort normal and breath sounds normal. No respiratory distress. She has no wheezes. She has no rhonchi.  Abdominal: Soft. She exhibits no pulsatile midline mass. There is no tenderness.  Neurological: She is alert and  oriented to person, place, and time.  Skin: Skin is warm and dry. No rash noted.  Psychiatric: She has a normal mood and affect. Her behavior is normal.  Vitals reviewed.  Vitals:   01/05/18 1055 01/05/18 1058  BP: (!) 147/87 (!) 138/92  Pulse: 62   Temp: 98.1 F (36.7 C)   TempSrc: Oral   SpO2: 94%   Weight: 184 lb 9.6 oz (83.7 kg)   Height: 5\' 1"  (1.549 m)       Assessment & Plan:   Nerea Bordenave is a 62 y.o. female Cough  Essential hypertension  Suspected upper airway irritation from heartburn as quick improvement when taking Zantac.  Less likely ACE inhibitor.    -Handout given  on foods to avoid with reflux, continue Zantac at night.    -Seems to have tolerated enalapril better than losartan, can restart enalapril but if cough returns then would consider ACE inhibitor cough If cough does return then restart losartan at 100 mg in the morning to see if that lessens nighttime orthostasis.    - Advised to contact me by MyChart with update in the next 2 weeks, RTC precautions.    -Can also titrate off allergy meds if symptoms improve.  No orders of the defined types were placed in this encounter.  Patient Instructions   Based on timing of improvement of symptoms, I am still suspicious of possible heartburn as a cause or contributor to your cough.  Continue Zantac at bedtime, see list of foods to avoid below and as symptoms improve can try coming off of Zantac in the next few weeks.  Additionally can decrease allergy medications over the course of the next few weeks if symptoms have improved as well.  You can try to restart the enalapril 10 mg as that was better tolerated than losartan. Stop losartan for now.   If cough comes back - STOP enalapril and to go back to losartan but take both pills in the morning (losartan 100mg  each morning) to see if that lessens dizziness at night. See other information on hypertension below.    Monitor your home readings and let me know with a  message on my chart in the next 2 weeks how you are doing including blood pressures and if cough has returned. Thank you for coming in today.  Return to the clinic or go to the nearest emergency room if any of your symptoms worsen or new symptoms occur.   Hypertension Hypertension, commonly called high blood pressure, is when the force of blood pumping through the arteries is too strong. The arteries are the blood vessels that carry blood from the heart throughout the body. Hypertension forces the heart to work harder to pump blood and may cause arteries to become narrow or stiff. Having untreated or uncontrolled hypertension can cause heart attacks, strokes, kidney disease, and other problems. A blood pressure reading consists of a higher number over a lower number. Ideally, your blood pressure should be below 120/80. The first ("top") number is called the systolic pressure. It is a measure of the pressure in your arteries as your heart beats. The second ("bottom") number is called the diastolic pressure. It is a measure of the pressure in your arteries as the heart relaxes. What are the causes? The cause of this condition is not known. What increases the risk? Some risk factors for high blood pressure are under your control. Others are not. Factors you can change  Smoking.  Having type 2 diabetes mellitus, high cholesterol, or both.  Not getting enough exercise or physical activity.  Being overweight.  Having too much fat, sugar, calories, or salt (sodium) in your diet.  Drinking too much alcohol. Factors that are difficult or impossible to change  Having chronic kidney disease.  Having a family history of high blood pressure.  Age. Risk increases with age.  Race. You may be at higher risk if you are African-American.  Gender. Men are at higher risk than women before age 98. After age 63, women are at higher risk than men.  Having obstructive sleep apnea.  Stress. What are  the signs or symptoms? Extremely high blood pressure (hypertensive crisis) may cause:  Headache.  Anxiety.  Shortness of breath.  Nosebleed.  Nausea and vomiting.  Severe chest pain.  Jerky movements you cannot control (seizures).  How is this diagnosed? This condition is diagnosed by measuring your blood pressure while you are seated, with your arm resting on a surface. The cuff of the blood pressure monitor will be placed directly against the skin of your upper arm at the level of your heart. It should be measured at least twice using the same arm. Certain conditions can cause a difference in blood pressure between your right and left arms. Certain factors can cause blood pressure readings to be lower or higher than normal (elevated) for a short period of time:  When your blood pressure is higher when you are in a health care provider's office than when you are at home, this is called white coat hypertension. Most people with this condition do not need medicines.  When your blood pressure is higher at home than when you are in a health care provider's office, this is called masked hypertension. Most people with this condition may need medicines to control blood pressure.  If you have a high blood pressure reading during one visit or you have normal blood pressure with other risk factors:  You may be asked to return on a different day to have your blood pressure checked again.  You may be asked to monitor your blood pressure at home for 1 week or longer.  If you are diagnosed with hypertension, you may have other blood or imaging tests to help your health care provider understand your overall risk for other conditions. How is this treated? This condition is treated by making healthy lifestyle changes, such as eating healthy foods, exercising more, and reducing your alcohol intake. Your health care provider may prescribe medicine if lifestyle changes are not enough to get your blood  pressure under control, and if:  Your systolic blood pressure is above 130.  Your diastolic blood pressure is above 80.  Your personal target blood pressure may vary depending on your medical conditions, your age, and other factors. Follow these instructions at home: Eating and drinking  Eat a diet that is high in fiber and potassium, and low in sodium, added sugar, and fat. An example eating plan is called the DASH (Dietary Approaches to Stop Hypertension) diet. To eat this way: ? Eat plenty of fresh fruits and vegetables. Try to fill half of your plate at each meal with fruits and vegetables. ? Eat whole grains, such as whole wheat pasta, brown rice, or whole grain bread. Fill about one quarter of your plate with whole grains. ? Eat or drink low-fat dairy products, such as skim milk or low-fat yogurt. ? Avoid fatty cuts of meat, processed or cured meats, and poultry with skin. Fill about one quarter of your plate with lean proteins, such as fish, chicken without skin, beans, eggs, and tofu. ? Avoid premade and processed foods. These tend to be higher in sodium, added sugar, and fat.  Reduce your daily sodium intake. Most people with hypertension should eat less than 1,500 mg of sodium a day.  Limit alcohol intake to no more than 1 drink a day for nonpregnant women and 2 drinks a day for men. One drink equals 12 oz of beer, 5 oz of wine, or 1 oz of hard liquor. Lifestyle  Work with your health care provider to maintain a healthy body weight or to lose weight. Ask what an ideal weight is for you.  Get at least 30 minutes  of exercise that causes your heart to beat faster (aerobic exercise) most days of the week. Activities may include walking, swimming, or biking.  Include exercise to strengthen your muscles (resistance exercise), such as pilates or lifting weights, as part of your weekly exercise routine. Try to do these types of exercises for 30 minutes at least 3 days a week.  Do not  use any products that contain nicotine or tobacco, such as cigarettes and e-cigarettes. If you need help quitting, ask your health care provider.  Monitor your blood pressure at home as told by your health care provider.  Keep all follow-up visits as told by your health care provider. This is important. Medicines  Take over-the-counter and prescription medicines only as told by your health care provider. Follow directions carefully. Blood pressure medicines must be taken as prescribed.  Do not skip doses of blood pressure medicine. Doing this puts you at risk for problems and can make the medicine less effective.  Ask your health care provider about side effects or reactions to medicines that you should watch for. Contact a health care provider if:  You think you are having a reaction to a medicine you are taking.  You have headaches that keep coming back (recurring).  You feel dizzy.  You have swelling in your ankles.  You have trouble with your vision. Get help right away if:  You develop a severe headache or confusion.  You have unusual weakness or numbness.  You feel faint.  You have severe pain in your chest or abdomen.  You vomit repeatedly.  You have trouble breathing. Summary  Hypertension is when the force of blood pumping through your arteries is too strong. If this condition is not controlled, it may put you at risk for serious complications.  Your personal target blood pressure may vary depending on your medical conditions, your age, and other factors. For most people, a normal blood pressure is less than 120/80.  Hypertension is treated with lifestyle changes, medicines, or a combination of both. Lifestyle changes include weight loss, eating a healthy, low-sodium diet, exercising more, and limiting alcohol. This information is not intended to replace advice given to you by your health care provider. Make sure you discuss any questions you have with your health  care provider. Document Released: 05/03/2005 Document Revised: 03/31/2016 Document Reviewed: 03/31/2016 Elsevier Interactive Patient Education  2018 [employer].  Food Choices for Gastroesophageal Reflux Disease, Adult When you have gastroesophageal reflux disease (GERD), the foods you eat and your eating habits are very important. Choosing the right foods can help ease your discomfort. What guidelines do I need to follow?  Choose fruits, vegetables, whole grains, and low-fat dairy products.  Choose low-fat meat, fish, and poultry.  Limit fats such as oils, salad dressings, butter, nuts, and avocado.  Keep a food diary. This helps you identify foods that cause symptoms.  Avoid foods that cause symptoms. These may be different for everyone.  Eat small meals often instead of 3 large meals a day.  Eat your meals slowly, in a place where you are relaxed.  Limit fried foods.  Cook foods using methods other than frying.  Avoid drinking alcohol.  Avoid drinking large amounts of liquids with your meals.  Avoid bending over or lying down until 2-3 hours after eating. What foods are not recommended? These are some foods and drinks that may make your symptoms worse: Vegetables Tomatoes. Tomato juice. Tomato and spaghetti sauce. Chili peppers. Onion and garlic. Horseradish. Fruits  Oranges, grapefruit, and lemon (fruit and juice). Meats High-fat meats, fish, and poultry. This includes hot dogs, ribs, ham, sausage, salami, and bacon. Dairy Whole milk and chocolate milk. Sour cream. Cream. Butter. Ice cream. Cream cheese. Drinks Coffee and tea. Bubbly (carbonated) drinks or energy drinks. Condiments Hot sauce. Barbecue sauce. Sweets/Desserts Chocolate and cocoa. Donuts. Peppermint and spearmint. Fats and Oils High-fat foods. This includes Jamaica[place] fries and potato chips. Other Vinegar. Strong spices. This includes black pepper, white pepper, red pepper, cayenne, curry powder,  cloves, ginger, and chili powder. The items listed above may not be a complete list of foods and drinks to avoid. Contact your dietitian for more information. This information is not intended to replace advice given to you by your health care provider. Make sure you discuss any questions you have with your health care provider. Document Released: 11/02/2011 Document Revised: 10/09/2015 Document Reviewed: 03/07/2013 Elsevier Interactive Patient Education  [employer]2017 Elsevier Inc.     If you have lab work done today you will be contacted with your lab results within the next 2 weeks.  If you have not heard from us then please contact us. The fastest way to get your results is to register for My Chart.   IF you received an x-ray today, you will receive an invoice from [facility]. Please contact [facility] at [phone] with questions or concerns regarding your invoice.   IF you received labwork today, you will receive an invoice from [facility]. Please contact [facility] at [phone] with questions or concerns regarding your invoice.   Our billing staff will not be able to assist you with questions regarding bills from these companies.  You will be contacted with the lab results as soon as they are available. The fastest way to get your results is to activate your My Chart account. Instructions are located on the last page of this paperwork. If you have not heard from us regarding the results in 2 weeks, please contact this office.       I personally performed the services described in this documentation, which was scribed in my presence. The recorded information has been reviewed and considered for accuracy and completeness, addended by me as needed, and agree with information above.  Signed,   Meredith StaggersJeffrey Genifer Lazenby, MD [facility] at [facility].  01/05/18 12:08 PM

## 2018-01-30 ENCOUNTER — Encounter: Payer: Self-pay | Admitting: Family Medicine

## 2018-02-13 ENCOUNTER — Encounter: Payer: Self-pay | Admitting: Obstetrics and Gynecology

## 2018-02-26 ENCOUNTER — Other Ambulatory Visit: Payer: Self-pay | Admitting: Family Medicine

## 2018-02-26 DIAGNOSIS — R059 Cough, unspecified: Secondary | ICD-10-CM

## 2018-02-26 DIAGNOSIS — I10 Essential (primary) hypertension: Secondary | ICD-10-CM

## 2018-02-26 DIAGNOSIS — R05 Cough: Secondary | ICD-10-CM

## 2018-02-27 NOTE — Telephone Encounter (Signed)
Requested medication (s) are due for refill today: Yes  Requested medication (s) are on the active medication list: Yes  Last refill:  HCTZ 11/08/17; Losartan 12/15/17  Future visit scheduled: No  Notes to clinic:  Unable to refill per protocol due to abnormal labs/BP     Requested Prescriptions  Pending Prescriptions Disp Refills   losartan (COZAAR) 50 MG tablet [Pharmacy Med Name: LOSARTAN 50MG  TAB] 30 tablet 1    Sig: TAKE 1 TABLET BY MOUTH ONCE DAILY     Cardiovascular:  Angiotensin Receptor Blockers Failed - 02/26/2018 12:11 PM      Failed - Last BP in normal range    BP Readings from Last 1 Encounters:  01/05/18 (!) 138/92         Passed - Cr in normal range and within 180 days    Creatinine, Ser  Date Value Ref Range Status  11/08/2017 0.79 0.57 - 1.00 mg/dL Final         Passed - K in normal range and within 180 days    Potassium  Date Value Ref Range Status  11/08/2017 3.9 3.5 - 5.2 mmol/L Final         Passed - Patient is not pregnant      Passed - Valid encounter within last 6 months    Recent Outpatient Visits          1 month ago Cough   [facility], Asencion Partridge, MD   2 months ago Cough   [facility], Asencion Partridge, MD   3 months ago Essential hypertension   [facility], Asencion Partridge, MD   4 years ago Essential hypertension   [facility], Franki Cabot, MD            hydrochlorothiazide (HYDRODIURIL) 25 MG tablet [Pharmacy Med Name: HYDROCHLOROT 25MG    TAB] 90 tablet 1    Sig: TAKE 1 TABLET BY MOUTH ONCE DAILY     Cardiovascular: Diuretics - Thiazide Failed - 02/26/2018 12:11 PM      Failed - Ca in normal range and within 360 days    Calcium  Date Value Ref Range Status  11/08/2017 10.5 (H) 8.7 - 10.3 mg/dL Final         Failed - Last BP in normal range    BP Readings from Last 1 Encounters:  01/05/18 (!) 138/92         Passed - Cr in normal range and within 360 days   Creatinine, Ser  Date Value Ref Range Status  11/08/2017 0.79 0.57 - 1.00 mg/dL Final         Passed - K in normal range and within 360 days    Potassium  Date Value Ref Range Status  11/08/2017 3.9 3.5 - 5.2 mmol/L Final         Passed - Na in normal range and within 360 days    Sodium  Date Value Ref Range Status  11/08/2017 139 134 - 144 mmol/L Final         Passed - Valid encounter within last 6 months    Recent Outpatient Visits          1 month ago Cough   [facility], Asencion Partridge, MD   2 months ago Cough   [facility], Asencion Partridge, MD   3 months ago Essential hypertension   [facility], [place]  R, MD   4 years ago Essential hypertension   [facility], Franki Cabot, MD           Signed Prescriptions Disp Refills   ranitidine (ZANTAC) 150 MG tablet 60 tablet 1    Sig: TAKE 1 TABLET BY MOUTH TWICE DAILY     Gastroenterology:  H2 Antagonists Passed - 02/26/2018 12:11 PM      Passed - Valid encounter within last 12 months    Recent Outpatient Visits          1 month ago Cough   [facility], Asencion Partridge, MD   2 months ago Cough   [facility], Asencion Partridge, MD   3 months ago Essential hypertension   [facility], Asencion Partridge, MD   4 years ago Essential hypertension   [facility], Franki Cabot, MD

## 2018-03-11 ENCOUNTER — Other Ambulatory Visit: Payer: Self-pay

## 2018-03-11 ENCOUNTER — Ambulatory Visit (INDEPENDENT_AMBULATORY_CARE_PROVIDER_SITE_OTHER): Payer: No Typology Code available for payment source | Admitting: Physician Assistant

## 2018-03-11 ENCOUNTER — Ambulatory Visit (INDEPENDENT_AMBULATORY_CARE_PROVIDER_SITE_OTHER): Payer: No Typology Code available for payment source

## 2018-03-11 ENCOUNTER — Encounter: Payer: Self-pay | Admitting: Physician Assistant

## 2018-03-11 VITALS — BP 122/78 | HR 89 | Temp 98.0°F | Ht 61.0 in | Wt 184.8 lb

## 2018-03-11 DIAGNOSIS — K219 Gastro-esophageal reflux disease without esophagitis: Secondary | ICD-10-CM | POA: Diagnosis not present

## 2018-03-11 DIAGNOSIS — R0683 Snoring: Secondary | ICD-10-CM

## 2018-03-11 DIAGNOSIS — R05 Cough: Secondary | ICD-10-CM

## 2018-03-11 DIAGNOSIS — J42 Unspecified chronic bronchitis: Secondary | ICD-10-CM | POA: Diagnosis not present

## 2018-03-11 DIAGNOSIS — R059 Cough, unspecified: Secondary | ICD-10-CM

## 2018-03-11 DIAGNOSIS — E669 Obesity, unspecified: Secondary | ICD-10-CM

## 2018-03-11 DIAGNOSIS — Z6834 Body mass index (BMI) 34.0-34.9, adult: Secondary | ICD-10-CM

## 2018-03-11 MED ORDER — OMEPRAZOLE 20 MG PO CPDR: 20.0000 mg | DELAYED_RELEASE_CAPSULE | Freq: Every day | ORAL | 0 refills | Status: DC

## 2018-03-11 MED ORDER — PREDNISONE 20 MG PO TABS: ORAL_TABLET | ORAL | 0 refills | Status: DC

## 2018-03-11 NOTE — Patient Instructions (Addendum)
We are going to treat your underlying inflammation with oral prednisone. Prednisone is a steroid and can cause side effects such as headache, irritability, nausea, vomiting, increased heart rate, increased blood pressure, increased blood sugar, appetite changes, and insomnia. Please take tablets in the morning with a full meal to help decrease the chances of these side effects.   Stop zantac. Start omeprazole daily.  If no improvement in 4 weeks, return to office. Will likely refer you to  pulmonology at that time.    If you have lab work done today you will be contacted with your lab results within the next 2 weeks.  If you have not heard from [place] then please contact us. The fastest way to get your results is to register for My Chart.  Cough, Adult A cough helps to clear your throat and lungs. A cough may last only 2-3 weeks (acute), or it may last longer than 8 weeks (chronic). Many different things can cause a cough. A cough may be a sign of an illness or another medical condition. Follow these instructions at home:  Pay attention to any changes in your cough.  Take medicines only as told by your doctor. ? If you were prescribed an antibiotic medicine, take it as told by your doctor. Do not stop taking it even if you start to feel better. ? Talk with your doctor before you try using a cough medicine.  Drink enough fluid to keep your pee (urine) clear or pale yellow.  If the air is dry, use a cold steam vaporizer or humidifier in your home.  Stay away from things that make you cough at work or at home.  If your cough is worse at night, try using extra pillows to raise your head up higher while you sleep.  Do not smoke, and try not to be around smoke. If you need help quitting, ask your doctor.  Do not have caffeine.  Do not drink alcohol.  Rest as needed. Contact a doctor if:  You have new problems (symptoms).  You cough up yellow fluid (pus).  Your cough does not get better  after 2-3 weeks, or your cough gets worse.  Medicine does not help your cough and you are not sleeping well.  You have pain that gets worse or pain that is not helped with medicine.  You have a fever.  You are losing weight and you do not know why.  You have night sweats. Get help right away if:  You cough up blood.  You have trouble breathing.  Your heartbeat is very fast. This information is not intended to replace advice given to you by your health care provider. Make sure you discuss any questions you have with your health care provider. Document Released: 01/14/2011 Document Revised: 10/09/2015 Document Reviewed: 07/10/2014 Elsevier Interactive Patient Education  2018 [employer].   IF you received an x-ray today, you will receive an invoice from [facility]. Please contact [facility] at [phone] with questions or concerns regarding your invoice.   IF you received labwork today, you will receive an invoice from [facility]. Please contact [facility] at [phone] with questions or concerns regarding your invoice.   Our billing staff will not be able to assist you with questions regarding bills from these companies.  You will be contacted with the lab results as soon as they are available. The fastest way to get your results is to activate your My Chart account. Instructions are located on the last page  of this paperwork. If you have not heard from [place] regarding the results in 2 weeks, please contact this office.

## 2018-03-11 NOTE — Progress Notes (Signed)
MRN: [id number] DOB: Jun 20, 1955  Subjective:   Erika Wolf is a 62 y.o. female presenting for chief complaint of Cough (since AUG cont. ) .Has been seen by Dr. Neva Seat in our office for this in the past.   Initially dx with bronchitis in 10/2017, tx with a zpack, no difference. Returned then d/c ACE-I, which helped but then it came back. Then started on flonase and zyrtec daily since 12/2017, helped a little. Pt has been on zantac daily since 01/05/18, did not make a difference.   Productive cough daily. Clear phlegm. Will have some nasal congestion. Denies fever, chills, SOB, orthopnea, lower leg swelling, chest pain. No known toxin exposure. Snores nightly. Has associated daytime somnolence. Denies witnessed apeneic episodes and falling asleep throughout the day. Never a smoker. PMH of exercise induced asthma. Albuterol inhaler prn is not working. Has PMH of GERD with acid taste in mouth and heartburn sensation: happens mostly when lying down at night, avoids spicy foods, waits at least 3 hrs after eating to lie down. Did try nexium this past week and did not notice a difference.    Erika Wolf has a current medication list which includes the following prescription(s): albuterol, cetirizine, estradiol, fluticasone, hydrochlorothiazide, losartan, metoprolol tartrate, and omeprazole. Also has No Known Allergies.  Erika Wolf  has a past medical history of HTN (hypertension) and Urinary incontinence. Also  has a past surgical history that includes Gallbladder surgery; Cesarean section (5 total); Total abdominal hysterectomy (2005); and Rotator cuff repair.   Objective:   Vitals: BP 122/78   Pulse 89   Temp 98 F (36.7 C) (Oral)   Ht 5\' 1"  (1.549 m)   Wt 184 lb 12.8 oz (83.8 kg)   SpO2 95%   BMI 34.92 kg/m   Physical Exam  Constitutional: She is oriented to person, place, and time. She appears well-developed and well-nourished. No distress.  HENT:  Head: Normocephalic and atraumatic.    Mouth/Throat: Uvula is midline, oropharynx is clear and moist and mucous membranes are normal. No tonsillar exudate.  Eyes: Conjunctivae are normal.  Neck: Normal range of motion.  Neck circumference measures 38cm.   Cardiovascular: Normal rate, regular rhythm, normal heart sounds and intact distal pulses.  Pulmonary/Chest: Effort normal and breath sounds normal. No accessory muscle usage or stridor. No tachypnea. No respiratory distress. She has no decreased breath sounds. She has no wheezes. She has no rhonchi. She has no rales.  Neurological: She is alert and oriented to person, place, and time.  Skin: Skin is warm and dry.  Psychiatric: She has a normal mood and affect.  Vitals reviewed.   No results found for this or any previous visit (from the past 24 hour(s)).  Dg Chest 2 View  Result Date: 03/11/2018 CLINICAL DATA:  Chronic cough. EXAM: CHEST - 2 VIEW COMPARISON:  11/08/2017. FINDINGS: Normal sized heart. Clear lungs. Mild-to-moderate peribronchial thickening with mild progression. Cholecystectomy clips and right shoulder fixation anchors. IMPRESSION: Mild to moderate bronchitic changes with mild progression. Electronically Signed   By: Beckie Salts M.D.   On: 03/11/2018 13:10    Assessment and Plan :  1. Chronic bronchitis, unspecified chronic bronchitis type (HCC) Pt is overall well appearing, NAD. Vitals stable. She has been experiencing a chronic cough for the past 4 months. Has tried multiple therapies. Has had some relief with allergy medication and d/c of ACE. No benefit from albuterol inhaler, H2 blocker, and abx. Never a smoker and no known chronic toxin exposure. CXR today  consistent with mild-mod bronchitic changes. Will attempt trial of prednisone at this time. Will also d/c zantac and start PPI as pt does have GERD. Will also refer to neurology for potential sleep study to r/o OSA. If no improvement in sx, would suggest referral to pulm for PFTs and possible work up for  Alpha-1-antitrypsin deficiency. Given strict return precautions.  - predniSONE (DELTASONE) 20 MG tablet; Take 3 PO QAM x3days, 2 PO QAM x3days, 1 PO QAM x3days  Dispense: 18 tablet; Refill: 0  2. Cough - DG Chest 2 View; Future  3. Gastroesophageal reflux disease, esophagitis presence not specified - omeprazole (PRILOSEC) 20 MG capsule; Take 1 capsule (20 mg total) by mouth daily.  Dispense: 60 capsule; Refill: 0  4. Snores - Ambulatory referral to Neurology  5. Class 1 obesity with serious comorbidity and body mass index (BMI) of 34.0 to 34.9 in adult, unspecified obesity type - Ambulatory referral to Neurology    Benjiman Core, PA-C  Primary Care at [facility] 03/11/2018 1:14 PM

## 2018-04-03 ENCOUNTER — Other Ambulatory Visit: Payer: Self-pay | Admitting: Family Medicine

## 2018-04-03 DIAGNOSIS — R059 Cough, unspecified: Secondary | ICD-10-CM

## 2018-04-03 DIAGNOSIS — R05 Cough: Secondary | ICD-10-CM

## 2018-04-03 DIAGNOSIS — I10 Essential (primary) hypertension: Secondary | ICD-10-CM

## 2018-04-04 NOTE — Telephone Encounter (Signed)
Requested Prescriptions  Pending Prescriptions Disp Refills  . losartan (COZAAR) 50 MG tablet [Pharmacy Med Name: LOSARTAN 50MG  TAB] 90 tablet 0    Sig: TAKE 1 TABLET BY MOUTH ONCE DAILY     Cardiovascular:  Angiotensin Receptor Blockers Passed - 04/03/2018  3:27 AM      Passed - Cr in normal range and within 180 days    Creatinine, Ser  Date Value Ref Range Status  11/08/2017 0.79 0.57 - 1.00 mg/dL Final         Passed - K in normal range and within 180 days    Potassium  Date Value Ref Range Status  11/08/2017 3.9 3.5 - 5.2 mmol/L Final         Passed - Patient is not pregnant      Passed - Last BP in normal range    BP Readings from Last 1 Encounters:  03/11/18 122/78         Passed - Valid encounter within last 6 months    Recent Outpatient Visits          3 weeks ago Chronic bronchitis, unspecified chronic bronchitis type [facility](HCC)   [facility], [place], PA-C   2 months ago Cough   [facility], [place]encion PartridgeJeffrey R, MD   3 months ago Cough   [facility], Asencion PartridgeJeffrey R, MD   4 months ago Essential hypertension   [facility], Asencion PartridgeJeffrey R, MD   4 years ago Essential hypertension   [facility], Franki CabotBarbara B, MD

## 2018-05-16 ENCOUNTER — Other Ambulatory Visit: Payer: Self-pay | Admitting: Family Medicine

## 2018-05-16 ENCOUNTER — Other Ambulatory Visit: Payer: Self-pay | Admitting: *Deleted

## 2018-05-16 ENCOUNTER — Other Ambulatory Visit: Payer: Self-pay | Admitting: Physician Assistant

## 2018-05-16 DIAGNOSIS — K219 Gastro-esophageal reflux disease without esophagitis: Secondary | ICD-10-CM

## 2018-05-16 DIAGNOSIS — R05 Cough: Secondary | ICD-10-CM

## 2018-05-16 DIAGNOSIS — I10 Essential (primary) hypertension: Secondary | ICD-10-CM

## 2018-05-16 DIAGNOSIS — R059 Cough, unspecified: Secondary | ICD-10-CM

## 2018-05-16 MED ORDER — OMEPRAZOLE 20 MG PO CPDR: 20.0000 mg | DELAYED_RELEASE_CAPSULE | Freq: Every day | ORAL | 0 refills | Status: DC

## 2018-05-16 NOTE — Progress Notes (Signed)
Requested Prescriptions  Pending Prescriptions Disp Refills  . omeprazole (PRILOSEC) 20 MG capsule 90 capsule 0    Sig: Take 1 capsule (20 mg total) by mouth daily.     There is no refill protocol information for this order

## 2018-07-19 ENCOUNTER — Other Ambulatory Visit: Payer: Self-pay | Admitting: Family Medicine

## 2018-07-19 DIAGNOSIS — I10 Essential (primary) hypertension: Secondary | ICD-10-CM

## 2018-07-19 NOTE — Telephone Encounter (Signed)
Patient has appointment scheduled 08/11/18. Courtesy refill given. Requested Prescriptions  Pending Prescriptions Disp Refills  . metoprolol tartrate (LOPRESSOR) 100 MG tablet [Pharmacy Med Name: Metoprolol Tartrate 100 MG Oral Tablet] 180 tablet 0    Sig: Take 1 tablet by mouth twice daily     Cardiovascular:  Beta Blockers Failed - 07/19/2018 11:49 AM      Failed - Valid encounter within last 6 months    Recent Outpatient Visits          4 months ago Chronic bronchitis, unspecified chronic bronchitis type [facility])   Primary Care at [place], [place] D, PA-C   6 months ago Cough   [facility] at [place], Asencion Partridge, MD   7 months ago Cough   [facility] at [place], Asencion Partridge, MD   8 months ago Essential hypertension   [facility] at [place], Asencion Partridge, MD   4 years ago Essential hypertension   [facility] Care at [place], Franki Cabot, MD      Future Appointments            In 3 weeks Shade Flood, MD Primary Care at [place], [facility]           Passed - Last BP in normal range    BP Readings from Last 1 Encounters:  03/11/18 122/78         Passed - Last Heart Rate in normal range    Pulse Readings from Last 1 Encounters:  03/11/18 89

## 2018-08-11 ENCOUNTER — Ambulatory Visit: Payer: Self-pay | Admitting: Family Medicine

## 2018-08-11 ENCOUNTER — Telehealth (INDEPENDENT_AMBULATORY_CARE_PROVIDER_SITE_OTHER): Payer: Self-pay | Admitting: Family Medicine

## 2018-08-11 ENCOUNTER — Other Ambulatory Visit: Payer: Self-pay

## 2018-08-11 DIAGNOSIS — I10 Essential (primary) hypertension: Secondary | ICD-10-CM

## 2018-08-11 DIAGNOSIS — K219 Gastro-esophageal reflux disease without esophagitis: Secondary | ICD-10-CM

## 2018-08-11 MED ORDER — METOPROLOL TARTRATE 100 MG PO TABS: 100.0000 mg | ORAL_TABLET | Freq: Two times a day (BID) | ORAL | 1 refills | Status: DC

## 2018-08-11 MED ORDER — OMEPRAZOLE 20 MG PO CPDR: 20.0000 mg | DELAYED_RELEASE_CAPSULE | Freq: Every day | ORAL | 1 refills | Status: DC

## 2018-08-11 MED ORDER — LOSARTAN POTASSIUM 100 MG PO TABS: 100.0000 mg | ORAL_TABLET | Freq: Every day | ORAL | 1 refills | Status: DC

## 2018-08-11 MED ORDER — HYDROCHLOROTHIAZIDE 25 MG PO TABS: 25.0000 mg | ORAL_TABLET | Freq: Every day | ORAL | 1 refills | Status: DC

## 2018-08-11 NOTE — Patient Instructions (Signed)
° ° ° °  If you have lab work done today you will be contacted with your lab results within the next 2 weeks.  If you have not heard from us then please contact us. The fastest way to get your results is to register for My Chart. ° ° °IF you received an x-ray today, you will receive an invoice from [facility]. Please contact [facility] at [phone] with questions or concerns regarding your invoice.  ° °IF you received labwork today, you will receive an invoice from [facility]. Please contact [facility] at [phone] with questions or concerns regarding your invoice.  ° °Our billing staff will not be able to assist you with questions regarding bills from these companies. ° °You will be contacted with the lab results as soon as they are available. The fastest way to get your results is to activate your My Chart account. Instructions are located on the last page of this paperwork. If you have not heard from us regarding the results in 2 weeks, please contact this office. °  ° ° ° °

## 2018-08-11 NOTE — Progress Notes (Signed)
Virtual Visit via Telephone Note  I connected with Erika Wolf on 08/11/18 at 10:16 AM by telephone and verified that I am speaking with the correct person using two identifiers.   I discussed the limitations, risks, security and privacy concerns of performing an evaluation and management service by telephone and the availability of in person appointments. I also discussed with the patient that there may be a patient responsible charge related to this service. The patient expressed understanding and agreed to proceed, consent obtained  Chief complaint: HTN. Med refill.    History of Present Illness:  Hypertension: BP Readings from Last 3 Encounters:  03/11/18 122/78  01/05/18 (!) 138/92  12/15/17 (!) 142/82   Lab Results  Component Value Date   CREATININE 0.79 11/08/2017  home readings: 143/90 today. Similar readings prior. Few higher readings. Has had some hip pain - planning on surgery June 13th.   HCTZ 25 mg daily, losartan 50 mg daily, metoprolol 100 mg twice daily.  Possible ACE inhibitor cough in the past, ultimately thought to be reflux related.  Prilosec discussed along with Zantac at night if needed in September 2019. Taking prilosec daily - using with iburprofen. No heartburn, cough resolved.  No CP,dypsnea, HA, lightheadedness.   Hypercalcemia:  -borderline at 10.5 in June 2019. Plan to recheck with upcoming blood work, then possible PTH.       Patient Active Problem List   Diagnosis Date Noted  . History of hematuria 12/27/2013  . Essential hypertension 12/27/2013  . Surgical menopause on hormone replacement therapy 12/27/2013  . Obesity (BMI 30.0-34.9) 12/27/2013   Past Medical History:  Diagnosis Date  . HTN (hypertension)   . Urinary incontinence    cough, sneeze   Past Surgical History:  Procedure Laterality Date  . CESAREAN SECTION  5 total  . GALLBLADDER SURGERY    . ROTATOR CUFF REPAIR    . TOTAL ABDOMINAL HYSTERECTOMY  2005   Fibroid  ovaries and tubes removed also   No Known Allergies Prior to Admission medications   Medication Sig Start Date End Date Taking? Authorizing Provider  cetirizine (ZYRTEC) 10 MG tablet Take 10 mg by mouth daily.   Yes [provider]  estradiol (ESTRACE) 1 MG tablet Take 1 tablet (1 mg total) by mouth daily. Patient taking differently: Take 0.5 mg by mouth daily.  11/08/17  Yes Shade Flood, MD  fluticasone [facility]) 50 MCG/ACT nasal spray Place into both nostrils daily.   Yes [provider]  hydrochlorothiazide (HYDRODIURIL) 25 MG tablet TAKE 1 TABLET BY MOUTH ONCE DAILY 05/16/18  Yes Shade Flood, MD  losartan (COZAAR) 50 MG tablet TAKE 1 TABLET BY MOUTH ONCE DAILY 05/16/18  Yes Shade Flood, MD  metoprolol tartrate (LOPRESSOR) 100 MG tablet Take 1 tablet by mouth twice daily 07/19/18  Yes Shade Flood, MD  omeprazole (PRILOSEC) 20 MG capsule Take 1 capsule (20 mg total) by mouth daily. 05/16/18  Yes Shade Flood, MD   Social History   Socioeconomic History  . Marital status: Married    Spouse name: Not on file  . Number of children: Not on file  . Years of education: Not on file  . Highest education level: Not on file  Occupational History  . Occupation: homemaker  Social Needs  . Financial resource strain: Not on file  . Food insecurity:    Worry: Not on file    Inability: Not on file  . Transportation needs:    Medical:  Not on file    Non-medical: Not on file  Tobacco Use  . Smoking status: Never Smoker  . Smokeless tobacco: Never Used  Substance and Sexual Activity  . Alcohol use: Yes    Comment: very rarely  . Drug use: No  . Sexual activity: Yes    Birth control/protection: Surgical    Comment: hysterectomy  Lifestyle  . Physical activity:    Days per week: Not on file    Minutes per session: Not on file  . Stress: Not on file  Relationships  . Social connections:    Talks on phone: Not on file    Gets together: Not on  file    Attends religious service: Not on file    Active member of club or organization: Not on file    Attends meetings of clubs or organizations: Not on file    Relationship status: Not on file  . Intimate partner violence:    Fear of current or ex partner: Not on file    Emotionally abused: Not on file    Physically abused: Not on file    Forced sexual activity: Not on file  Other Topics Concern  . Not on file  Social History Narrative   Married;   5 children, 4 live in [place], 1 lives in [place].   2 grandchildren           Observations/Objective: No distress on phone.  Assessment and Plan: Hypercalcemia  -Repeat check on upcoming BMP, PTH if elevated.  Essential hypertension  -Continue metoprolol, hydrochlorothiazide same dose, increase losartan to 100 mg daily potential side effects, orthostatic hypotension precautions discussed.  Update by MyChart next few weeks.  Lab only visit in the next 2 to 3 weeks.  Gastroesophageal reflux disease, esophagitis presence not specified  -Stable with Prilosec, okay to continue for now daily given need for ibuprofen for hip pain.  Ultimately would like to see her decreased need for ibuprofen/NSAIDs given hypertension history.  Follow Up Instructions: My chart update next few weeks, office visit 6 months.   I discussed the assessment and treatment plan with the patient. The patient was provided an opportunity to ask questions and all were answered. The patient agreed with the plan and demonstrated an understanding of the instructions.   The patient was advised to call back or seek an in-person evaluation if the symptoms worsen or if the condition fails to improve as anticipated.  I provided 9 minutes of non-face-to-face time during this encounter.  Signed,   Meredith Staggers, MD Primary Care at [facility].  08/11/18

## 2018-08-11 NOTE — Progress Notes (Signed)
RFV- Bp follow up,

## 2018-08-24 ENCOUNTER — Ambulatory Visit: Payer: Self-pay

## 2018-09-25 ENCOUNTER — Other Ambulatory Visit: Payer: Self-pay

## 2018-09-25 ENCOUNTER — Encounter: Payer: Self-pay | Admitting: Family Medicine

## 2018-09-25 ENCOUNTER — Ambulatory Visit: Payer: Self-pay

## 2018-09-25 DIAGNOSIS — I10 Essential (primary) hypertension: Secondary | ICD-10-CM

## 2018-09-25 LAB — BASIC METABOLIC PANEL
BUN/Creatinine Ratio: 27 (ref 12–28)
BUN: 21 mg/dL (ref 8–27)
CO2: 24 mmol/L (ref 20–29)
Calcium: 10.1 mg/dL (ref 8.7–10.3)
Chloride: 99 mmol/L (ref 96–106)
Creatinine, Ser: 0.79 mg/dL (ref 0.57–1.00)
GFR calc Af Amer: 93 mL/min/{1.73_m2} (ref >59)
GFR calc non Af Amer: 80 mL/min/{1.73_m2} (ref >59)
Glucose: 90 mg/dL (ref 65–99)
Potassium: 4.4 mmol/L (ref 3.5–5.2)
Sodium: 140 mmol/L (ref 134–144)

## 2018-09-26 NOTE — H&P (Signed)
TOTAL HIP ADMISSION H&P  Patient is admitted for right total hip arthroplasty.  Subjective:  Chief Complaint: right hip pain  HPI: Erika Wolf, 63 y.o. female, has a history of pain and functional disability in the right hip(s) due to arthritis and patient has failed non-surgical conservative treatments for greater than 12 weeks to include corticosteriod injections and activity modification.  Onset of symptoms was gradual starting 2 years ago with gradually worsening course since that time.The patient noted no past surgery on the right hip(s).  Patient currently rates pain in the right hip at 8 out of 10 with activity. Patient has worsening of pain with activity and weight bearing, pain that interfers with activities of daily living and catching. 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 This condition presents safety issues increasing the risk of falls. There is no current active infection.  Patient Active Problem List   Diagnosis Date Noted  . History of hematuria 12/27/2013  . Essential hypertension 12/27/2013  . Surgical menopause on hormone replacement therapy 12/27/2013  . Obesity (BMI 30.0-34.9) 12/27/2013   Past Medical History:  Diagnosis Date  . HTN (hypertension)   . Urinary incontinence    cough, sneeze    Past Surgical History:  Procedure Laterality Date  . CESAREAN SECTION  5 total  . GALLBLADDER SURGERY    . ROTATOR CUFF REPAIR    . TOTAL ABDOMINAL HYSTERECTOMY  2005   Fibroid ovaries and tubes removed also    No current facility-administered medications for this encounter.    Current Outpatient Medications  Medication Sig Dispense Refill Last Dose  . cetirizine (ZYRTEC) 10 MG tablet Take 10 mg by mouth daily.   Taking  . estradiol (ESTRACE) 1 MG tablet Take 1 tablet (1 mg total) by mouth daily. (Patient taking  differently: Take 0.5 mg by mouth daily. ) 90 tablet 1 Taking  . fluticasone (FLONASE) 50 MCG/ACT nasal spray Place into both nostrils daily.   Taking  . hydrochlorothiazide (HYDRODIURIL) 25 MG tablet Take 1 tablet (25 mg total) by mouth daily. 90 tablet 1   . losartan (COZAAR) 100 MG tablet Take 1 tablet (100 mg total) by mouth daily. 90 tablet 1   . metoprolol tartrate (LOPRESSOR) 100 MG tablet Take 1 tablet (100 mg total) by mouth 2 (two) times daily. 180 tablet 1   . omeprazole (PRILOSEC) 20 MG capsule Take 1 capsule (20 mg total) by mouth daily. 90 capsule 1    No Known Allergies  Social History   Tobacco Use  . Smoking status: Never Smoker  . Smokeless tobacco: Never Used  Substance Use Topics  . Alcohol use: Yes    Comment: very rarely    Family History  Problem Relation Age of Onset  . Hyperlipidemia Mother   . Hypertension Mother   . Cancer Father        colon  . Aneurysm Father        brain  . Cancer Sister        ovarian  . Hyperlipidemia Brother   . Breast cancer Daughter      Review of Systems  Constitutional: Negative for chills and fever.  HENT: Negative for congestion, sore throat and tinnitus.   Eyes: Negative for double vision, photophobia and pain.  Respiratory: Negative for cough, shortness of breath and wheezing.   Cardiovascular: Negative for chest pain,  palpitations and orthopnea.  Gastrointestinal: Negative for heartburn, nausea and vomiting.  Genitourinary: Negative for dysuria, frequency and urgency.  Musculoskeletal: Positive for joint pain.  Neurological: Negative for dizziness, weakness and headaches.    Objective:  Physical Exam  Well nourished and well developed. General: Alert and oriented x3, cooperative and pleasant, no acute distress. Head: normocephalic, atraumatic, neck supple. Eyes: EOMI. Respiratory: breath sounds clear in all fields, no wheezing, rales, or rhonchi. Cardiovascular: Regular rate and rhythm, no murmurs, gallops  or rubs.  Abdomen: non-tender to palpation and soft, normoactive bowel sounds.  Musculoskeletal: Right Hip Exam: ROM: Flexion to 100, Internal Rotation is minimal, External Rotation 20, and abduction 20 with discomfort.  There is no tenderness over the greater trochanter bursa.   Calves soft and nontender. Motor function intact in LE. Strength 5/5 LE bilaterally. Neuro: Distal pulses 2+. Sensation to light touch intact in LE.  Vital signs in last 24 hours: Blood pressure: 122/90 mmHg  Labs:   Estimated body mass index is 34.92 kg/m as calculated from the following:   Height as of 03/11/18: 5\' 1"  (1.549 m).   Weight as of 03/11/18: 83.8 kg.   Imaging Review Plain radiographs demonstrate severe degenerative joint disease of the right hip(s). The bone quality appears to be adequate for age and reported activity level.      Assessment/Plan:  End stage arthritis, right hip(s)  The patient history, physical examination, clinical judgement of the provider and imaging studies are consistent with end stage degenerative joint disease of the right hip(s) and total hip arthroplasty is deemed medically necessary. The treatment options including medical management, injection therapy, arthroscopy and arthroplasty were discussed at length. The risks and benefits of total hip arthroplasty were presented and reviewed. The risks due to aseptic loosening, infection, stiffness, dislocation/subluxation,  thromboembolic complications and other imponderables were discussed.  The patient acknowledged the explanation, agreed to proceed with the plan and consent was signed. Patient is being admitted for inpatient treatment for surgery, pain control, PT, OT, prophylactic antibiotics, VTE prophylaxis, progressive ambulation and ADL's and discharge planning.The patient is planning to be discharged home.   Anticipated LOS equal to or greater than 2 midnights due to - Age 42 and older with one or more of the  following:  - Obesity  - Expected need for hospital services (PT, OT, Nursing) required for safe  discharge  - Anticipated need for postoperative skilled nursing care or inpatient rehab  - Active co-morbidities: None OR   - Unanticipated findings during/Post Surgery: None  - Patient is a high risk of re-admission due to: None   Therapy Plans: HEP Disposition: Home with husband Planned DVT Prophylaxis: aspirin 325mg  BID DME needed: 3-n-1 PCP: Dr. Albertine Patricia TXA: IV Allergies: NKDA Anesthesia Concerns: none BMI: 34.4  - Patient was instructed on what medications to stop prior to surgery. - Follow-up visit in 2 weeks with Dr. Lequita Halt - Begin physical therapy following surgery - Pre-operative lab work as pre-surgical testing - Prescriptions will be provided in hospital at time of discharge  Arther Abbott, PA-C Orthopedic Surgery [facility] [place]

## 2018-09-26 NOTE — Telephone Encounter (Signed)
Please contact pt to schedule pre-op clearance appt with Dr. Neva Seat.   Form received and placed in Dr. Paralee Cancel box at clinical desk.   Thank you,  Doyne Keel

## 2018-09-29 ENCOUNTER — Telehealth: Payer: Self-pay

## 2018-09-29 NOTE — Telephone Encounter (Signed)
preop eval appt 10/06/18. Will hold onto forms for that appt.

## 2018-09-29 NOTE — Telephone Encounter (Signed)
I have received form for pre-opt clearance for a total hip scheduled for 10/18/2018.   I have placed the form in provider box to review & complete.

## 2018-10-06 ENCOUNTER — Ambulatory Visit (INDEPENDENT_AMBULATORY_CARE_PROVIDER_SITE_OTHER): Payer: Self-pay | Admitting: Family Medicine

## 2018-10-06 ENCOUNTER — Encounter: Payer: Self-pay | Admitting: Family Medicine

## 2018-10-06 ENCOUNTER — Other Ambulatory Visit: Payer: Self-pay

## 2018-10-06 VITALS — BP 152/80 | HR 75 | Temp 98.8°F | Ht 62.0 in | Wt 195.4 lb

## 2018-10-06 DIAGNOSIS — Z01818 Encounter for other preprocedural examination: Secondary | ICD-10-CM

## 2018-10-06 LAB — POCT URINALYSIS DIP (MANUAL ENTRY)
Bilirubin, UA: NEGATIVE
Blood, UA: NEGATIVE
Glucose, UA: NEGATIVE mg/dL
Ketones, POC UA: NEGATIVE mg/dL
Leukocytes, UA: NEGATIVE
Nitrite, UA: NEGATIVE
Protein Ur, POC: NEGATIVE mg/dL
Spec Grav, UA: 1.015 (ref 1.010–1.025)
Urobilinogen, UA: 0.2 E.U./dL
pH, UA: 7 (ref 5.0–8.0)

## 2018-10-06 LAB — POCT GLYCOSYLATED HEMOGLOBIN (HGB A1C): Hemoglobin A1C: 5.5 % (ref 4.0–5.6)

## 2018-10-06 NOTE — Progress Notes (Signed)
Subjective:    Patient ID: Erika Wolf, female    DOB: 05/26/1955, 63 y.o.   MRN: [id number]  HPI Erika Wolf is a 63 y.o. female Presents today for: Chief Complaint  Patient presents with  . pre op clearance    right hip replacement  (scheduled for 10/18/2018)   Preoperative evaluation for right hip replacement, planned appointment for procedure on June 3.  Dr. Lequita HaltAluisio.  preop  Labs next week.   Hypertension: BP Readings from Last 3 Encounters:  10/06/18 (!) 152/80  03/11/18 122/78  01/05/18 (!) 138/92   Lab Results  Component Value Date   CREATININE 0.79 09/25/2018  HCTZ 25 mg daily, losartan 100mg  daily, metoprolol 100 mg twice daily. No history of diabetes, glucose normal on electrolyte testing May 11. BMI 35 Nonsmoker.  No anticoagulants, no aspirin.  No lung disease., exercise induced asthma only - not using.   Housework - no issues.  No CP/dyspnea with city block or walking up 2 flight of stairs.  RCRI Goldman risk index score of 0.     Patient Active Problem List   Diagnosis Date Noted  . History of hematuria 12/27/2013  . Essential hypertension 12/27/2013  . Surgical menopause on hormone replacement therapy 12/27/2013  . Obesity (BMI 30.0-34.9) 12/27/2013   Past Medical History:  Diagnosis Date  . HTN (hypertension)   . Urinary incontinence    cough, sneeze   Past Surgical History:  Procedure Laterality Date  . CESAREAN SECTION  5 total  . GALLBLADDER SURGERY    . ROTATOR CUFF REPAIR    . TOTAL ABDOMINAL HYSTERECTOMY  2005   Fibroid ovaries and tubes removed also   No Known Allergies Prior to Admission medications   Medication Sig Start Date End Date Taking? Authorizing Provider  cetirizine (ZYRTEC) 10 MG tablet Take 10 mg by mouth daily.   Yes [provider]  estradiol (ESTRACE) 1 MG tablet Take 1 tablet (1 mg total) by mouth daily. Patient taking differently: Take 0.5 mg by mouth every other day. In the morning 11/08/17  Yes  Shade FloodGreene, Delainey Winstanley R, MD  fluticasone [facility](FLONASE) 50 MCG/ACT nasal spray Place 1 spray into both nostrils daily. ALLER-FLO   Yes [provider]  hydrochlorothiazide (HYDRODIURIL) 25 MG tablet Take 1 tablet (25 mg total) by mouth daily. 08/11/18  Yes Shade FloodGreene, Kiandra Sanguinetti R, MD  losartan (COZAAR) 100 MG tablet Take 1 tablet (100 mg total) by mouth daily. 08/11/18  Yes Shade FloodGreene, Javier Gell R, MD  metoprolol tartrate (LOPRESSOR) 100 MG tablet Take 1 tablet (100 mg total) by mouth 2 (two) times daily. 08/11/18  Yes Shade FloodGreene, Nickalos Petersen R, MD  naproxen sodium (ALEVE) 220 MG tablet Take 440 mg by mouth 2 (two) times a day.   Yes [provider]  omeprazole (PRILOSEC) 20 MG capsule Take 1 capsule (20 mg total) by mouth daily. 08/11/18  Yes Shade FloodGreene, Nacole Fluhr R, MD  Biotin w/ Vitamins C & E (HAIR/SKIN/NAILS PO) Take 1 tablet by mouth daily.    [provider]   Social History   Socioeconomic History  . Marital status: Married    Spouse name: Not on file  . Number of children: Not on file  . Years of education: Not on file  . Highest education level: Not on file  Occupational History  . Occupation: homemaker  Social Needs  . Financial resource strain: Not on file  . Food insecurity:    Worry: Not on file    Inability: Not on file  .  Transportation needs:    Medical: Not on file    Non-medical: Not on file  Tobacco Use  . Smoking status: Never Smoker  . Smokeless tobacco: Never Used  Substance and Sexual Activity  . Alcohol use: Yes    Comment: very rarely  . Drug use: No  . Sexual activity: Yes    Birth control/protection: Surgical    Comment: hysterectomy  Lifestyle  . Physical activity:    Days per week: Not on file    Minutes per session: Not on file  . Stress: Not on file  Relationships  . Social connections:    Talks on phone: Not on file    Gets together: Not on file    Attends religious service: Not on file    Active member of club or organization: Not on file    Attends  meetings of clubs or organizations: Not on file    Relationship status: Not on file  . Intimate partner violence:    Fear of current or ex partner: Not on file    Emotionally abused: Not on file    Physically abused: Not on file    Forced sexual activity: Not on file  Other Topics Concern  . Not on file  Social History Narrative   Married;   5 children, 4 live in [place], 1 lives in [place].   2 grandchildren          Review of Systems  Constitutional: Negative for fatigue and unexpected weight change.  Respiratory: Negative for chest tightness and shortness of breath.   Cardiovascular: Negative for chest pain, palpitations and leg swelling.  Gastrointestinal: Negative for abdominal pain and blood in stool.  Neurological: Negative for dizziness, syncope, light-headedness and headaches.       Objective:   Physical Exam Vitals signs reviewed.  Constitutional:      Appearance: She is well-developed.  HENT:     Head: Normocephalic and atraumatic.  Eyes:     Conjunctiva/sclera: Conjunctivae normal.     Pupils: Pupils are equal, round, and reactive to light.  Neck:     Vascular: No carotid bruit.     Comments: No JVD Cardiovascular:     Rate and Rhythm: Normal rate and regular rhythm.     Heart sounds: Normal heart sounds. No murmur.  Pulmonary:     Effort: Pulmonary effort is normal.     Breath sounds: Normal breath sounds.  Abdominal:     Palpations: Abdomen is soft. There is no pulsatile mass.     Tenderness: There is no abdominal tenderness.  Musculoskeletal:     Right lower leg: No edema.     Left lower leg: No edema.     Comments: Right hip limited range of motion with discomfort.  Skin:    General: Skin is warm and dry.  Neurological:     Mental Status: She is alert and oriented to person, place, and time.  Psychiatric:        Behavior: Behavior normal.    Vitals:   10/06/18 1332 10/06/18 1354  BP: (!) 151/85 (!) 152/80  Pulse: 75   Temp: 98.8 F (37.1  C)   TempSrc: Oral   SpO2: 96%   Weight: 195 lb 6.4 oz (88.6 kg)   Height: 5\' 2"  (1.575 m)     EKG: Sinus rhythm, single ectopic beat, negative precordial T waves but no acute ST/T wave changes seen.    Assessment & Plan:  Erika Wolf is a 63 y.o.  female Pre-op evaluation - Plan: EKG 12-Lead, DG Chest 2 View, POCT glycosylated hemoglobin (Hb A1C), POCT urinalysis dipstick No apparent increase risk of major adverse cardiac event based on history and exam.  Preoperative labs have been ordered including CMP, CBC.  Can evaluate the electrolytes, CBC and albumin once those have been performed.  I did add A1c and urinalysis testing in office and will order chest x-ray for clearance.  Anticipate she will be low risk for surgical seizure, specifically no apparent increased risk of major adverse cardiac event.  Paperwork to be completed once labs/x-ray reviewed.  Advised to follow-up with gastroenterology regarding timing of colonoscopy.  Inadvertently typed cardiology on patient instructions.  Borderline elevated systolic BP in office, advised to monitor home readings and if remaining over 140/90, may need medication adjustments.  RTC precautions.  No orders of the defined types were placed in this encounter.  Patient Instructions    Based on your exam and history today, I do not have any apparent restrictions to upcoming surgery.  Preoperative labs should be sufficient for what I need to complete your paperwork, but will check the hemoglobin A1c your diabetes screening test today, urine test, and I ordered an x-ray to be performed at [facility] imaging.  Keep an eye on your blood pressure and if remains over 140/90, we may need to adjust your regimen.  Follow-up with cardiology on timing of colonoscopy.    Let me know if there are questions and good luck with your surgery.   If you have lab work done today you will be contacted with your lab results within the next 2 weeks.  If you have  not heard from [place] then please contact us. The fastest way to get your results is to register for My Chart.   IF you received an x-ray today, you will receive an invoice from [facility]. Please contact [facility] at [phone] with questions or concerns regarding your invoice.   IF you received labwork today, you will receive an invoice from [facility]. Please contact [facility] at [phone] with questions or concerns regarding your invoice.   Our billing staff will not be able to assist you with questions regarding bills from these companies.  You will be contacted with the lab results as soon as they are available. The fastest way to get your results is to activate your My Chart account. Instructions are located on the last page of this paperwork. If you have not heard from [place] regarding the results in 2 weeks, please contact this office.       Signed,   Meredith Staggers, MD Primary Care at [facility].  10/06/18 2:21 PM

## 2018-10-06 NOTE — Patient Instructions (Addendum)
  Based on your exam and history today, I do not have any apparent restrictions to upcoming surgery.  Preoperative labs should be sufficient for what I need to complete your paperwork, but will check the hemoglobin A1c your diabetes screening test today, urine test, and I ordered an x-ray to be performed at [facility] imaging.  Keep an eye on your blood pressure and if remains over 140/90, we may need to adjust your regimen.  Follow-up with cardiology on timing of colonoscopy.    Let me know if there are questions and good luck with your surgery.   If you have lab work done today you will be contacted with your lab results within the next 2 weeks.  If you have not heard from [place] then please contact us. The fastest way to get your results is to register for My Chart.   IF you received an x-ray today, you will receive an invoice from [facility]. Please contact [facility] at [phone] with questions or concerns regarding your invoice.   IF you received labwork today, you will receive an invoice from [facility]. Please contact [facility] at [phone] with questions or concerns regarding your invoice.   Our billing staff will not be able to assist you with questions regarding bills from these companies.  You will be contacted with the lab results as soon as they are available. The fastest way to get your results is to activate your My Chart account. Instructions are located on the last page of this paperwork. If you have not heard from [place] regarding the results in 2 weeks, please contact this office.

## 2018-10-11 ENCOUNTER — Ambulatory Visit
Admission: RE | Admit: 2018-10-11 | Discharge: 2018-10-11 | Disposition: A | Discharge status: Home / Self Care | Payer: PRIVATE HEALTH INSURANCE | Source: Ambulatory Visit | Attending: Family Medicine | Admitting: Family Medicine

## 2018-10-11 ENCOUNTER — Other Ambulatory Visit: Payer: Self-pay

## 2018-10-11 DIAGNOSIS — Z01818 Encounter for other preprocedural examination: Secondary | ICD-10-CM

## 2018-10-11 NOTE — Progress Notes (Signed)
10-06-18 (Epic) Surgical Clearance, EKG, and CXR

## 2018-10-11 NOTE — Patient Instructions (Addendum)
Erika Wolf  10/11/2018   Your procedure is scheduled on: 10-18-18    Report to [facility] Main  Entrance    Report to Admitting at 6:00 AM   YOU NEED TO HAVE A COVID 19 TEST ON 10-16-18 @10 :50 AM_, THIS TEST MUST BE DONE BEFORE SURGERY, COME TO [facility] ENTRANCE BETWEEN THE HOURS OF 900 AM AND 300 PM ON YOUR COVID TEST DATE. AFTER YOUR TEST, YOU WILL HAVE TO BEGIN THE QUARANTINE PROCESS PER YOUR HANDOUT INSTRUCTIONS.   Call this number if you have problems the morning of surgery [phone]    Remember: Do not eat food or drink liquids :After Midnight.    BRUSH YOUR TEETH MORNING OF SURGERY AND RINSE YOUR MOUTH OUT, NO CHEWING GUM CANDY OR MINTS.     Take these medicines the morning of surgery with A SIP OF WATER: Cetirizine (Zyrtec), Metoprolol Tartrate (Lopressor), and Omeprazole (Prilosec). You may also use and bring your inhaler as needed.                                 You may not have any metal on your body including hair pins and              piercings     Do not wear jewelry, make-up, lotions, powders or perfumes, deodorant              Do not wear nail polish.  Do not shave  48 hours prior to surgery.                 Do not bring valuables to the hospital. [facility] IS NOT             RESPONSIBLE   FOR VALUABLES.  Contacts, dentures or bridgework may not be worn into surgery.    Special Instructions: N/A              Please read over the following fact sheets you were given: _____________________________________________________________________             [facility] - Preparing for Surgery Before surgery, you can play an important role.  Because skin is not sterile, your skin needs to be as free of germs as possible.  You can reduce the number of germs on your skin by washing with CHG (chlorahexidine gluconate) soap before surgery.  CHG is an antiseptic cleaner which kills germs and bonds with the skin to  continue killing germs even after washing. Please DO NOT use if you have an allergy to CHG or antibacterial soaps.  If your skin becomes reddened/irritated stop using the CHG and inform your nurse when you arrive at Short Stay. Do not shave (including legs and underarms) for at least 48 hours prior to the first CHG shower.  You may shave your face/neck. Please follow these instructions carefully:  1.  Shower with CHG Soap the night before surgery and the  morning of Surgery.  2.  If you choose to wash your hair, wash your hair first as usual with your  normal  shampoo.  3.  After you shampoo, rinse your hair and body thoroughly to remove the  shampoo.  4.  Use CHG as you would any other liquid soap.  You can apply chg directly  to the skin and wash                       Gently with a scrungie or clean washcloth.  5.  Apply the CHG Soap to your body ONLY FROM THE NECK DOWN.   Do not use on face/ open                           Wound or open sores. Avoid contact with eyes, ears mouth and genitals (private parts).                       Wash face,  Genitals (private parts) with your normal soap.             6.  Wash thoroughly, paying special attention to the area where your surgery  will be performed.  7.  Thoroughly rinse your body with warm water from the neck down.  8.  DO NOT shower/wash with your normal soap after using and rinsing off  the CHG Soap.                9.  Pat yourself dry with a clean towel.            10.  Wear clean pajamas.            11.  Place clean sheets on your bed the night of your first shower and do not  sleep with pets. Day of Surgery : Do not apply any lotions/deodorants the morning of surgery.  Please wear clean clothes to the hospital/surgery center.  FAILURE TO FOLLOW THESE INSTRUCTIONS MAY RESULT IN THE CANCELLATION OF YOUR SURGERY PATIENT SIGNATURE_________________________________  NURSE  SIGNATURE__________________________________  ________________________________________________________________________   Adam Phenix  An incentive spirometer is a tool that can help keep your lungs clear and active. This tool measures how well you are filling your lungs with each breath. Taking long deep breaths may help reverse or decrease the chance of developing breathing (pulmonary) problems (especially infection) following:  A long period of time when you are unable to move or be active. BEFORE THE PROCEDURE   If the spirometer includes an indicator to show your best effort, your nurse or respiratory therapist will set it to a desired goal.  If possible, sit up straight or lean slightly forward. Try not to slouch.  Hold the incentive spirometer in an upright position. INSTRUCTIONS FOR USE  1. Sit on the edge of your bed if possible, or sit up as far as you can in bed or on a chair. 2. Hold the incentive spirometer in an upright position. 3. Breathe out normally. 4. Place the mouthpiece in your mouth and seal your lips tightly around it. 5. Breathe in slowly and as deeply as possible, raising the piston or the ball toward the top of the column. 6. Hold your breath for 3-5 seconds or for as long as possible. Allow the piston or ball to fall to the bottom of the column. 7. Remove the mouthpiece from your mouth and breathe out normally. 8. Rest for a few seconds and repeat Steps 1 through 7 at least 10 times every 1-2 hours when you are awake. Take your time and take a few normal breaths between deep breaths. 9. The spirometer may include an indicator to show  your best effort. Use the indicator as a goal to work toward during each repetition. 10. After each set of 10 deep breaths, practice coughing to be sure your lungs are clear. If you have an incision (the cut made at the time of surgery), support your incision when coughing by placing a pillow or rolled up towels firmly  against it. Once you are able to get out of bed, walk around indoors and cough well. You may stop using the incentive spirometer when instructed by your caregiver.  RISKS AND COMPLICATIONS  Take your time so you do not get dizzy or light-headed.  If you are in pain, you may need to take or ask for pain medication before doing incentive spirometry. It is harder to take a deep breath if you are having pain. AFTER USE  Rest and breathe slowly and easily.  It can be helpful to keep track of a log of your progress. Your caregiver can provide you with a simple table to help with this. If you are using the spirometer at home, follow these instructions: [place] IF:   You are having difficultly using the spirometer.  You have trouble using the spirometer as often as instructed.  Your pain medication is not giving enough relief while using the spirometer.  You develop fever of 100.5 F (38.1 C) or higher. SEEK IMMEDIATE MEDICAL CARE IF:   You cough up bloody sputum that had not been present before.  You develop fever of 102 F (38.9 C) or greater.  You develop worsening pain at or near the incision site. MAKE SURE YOU:   Understand these instructions.  Will watch your condition.  Will get help right away if you are not doing well or get worse. Document Released: 09/13/2006 Document Revised: 07/26/2011 Document Reviewed: 11/14/2006 Exit[employer] Patient Information 2014 [employer], [place].   ________________________________________________________________________  WHAT IS A BLOOD TRANSFUSION? Blood Transfusion Information  A transfusion is the replacement of blood or some of its parts. Blood is made up of multiple cells which provide different functions.  Red blood cells carry oxygen and are used for blood loss replacement.  White blood cells fight against infection.  Platelets control bleeding.  Plasma helps clot blood.  Other blood products are available for  specialized needs, such as hemophilia or other clotting disorders. BEFORE THE TRANSFUSION  Who gives blood for transfusions?   Healthy volunteers who are fully evaluated to make sure their blood is safe. This is blood bank blood. Transfusion therapy is the safest it has ever been in the practice of medicine. Before blood is taken from a donor, a complete history is taken to make sure that person has no history of diseases nor engages in risky social behavior (examples are intravenous drug use or sexual activity with multiple partners). The donor's travel history is screened to minimize risk of transmitting infections, such as malaria. The donated blood is tested for signs of infectious diseases, such as HIV and hepatitis. The blood is then tested to be sure it is compatible with you in order to minimize the chance of a transfusion reaction. If you or a relative donates blood, this is often done in anticipation of surgery and is not appropriate for emergency situations. It takes many days to process the donated blood. RISKS AND COMPLICATIONS Although transfusion therapy is very safe and saves many lives, the main dangers of transfusion include:   Getting an infectious disease.  Developing a transfusion reaction. This is an allergic reaction to  something in the blood you were given. Every precaution is taken to prevent this. The decision to have a blood transfusion has been considered carefully by your caregiver before blood is given. Blood is not given unless the benefits outweigh the risks. AFTER THE TRANSFUSION  Right after receiving a blood transfusion, you will usually feel much better and more energetic. This is especially true if your red blood cells have gotten low (anemic). The transfusion raises the level of the red blood cells which carry oxygen, and this usually causes an energy increase.  The nurse administering the transfusion will monitor you carefully for complications. HOME CARE  INSTRUCTIONS  No special instructions are needed after a transfusion. You may find your energy is better. Speak with your caregiver about any limitations on activity for underlying diseases you may have. SEEK MEDICAL CARE IF:   Your condition is not improving after your transfusion.  You develop redness or irritation at the intravenous (IV) site. SEEK IMMEDIATE MEDICAL CARE IF:  Any of the following symptoms occur over the next 12 hours:  Shaking chills.  You have a temperature by mouth above 102 F (38.9 C), not controlled by medicine.  Chest, back, or muscle pain.  People around you feel you are not acting correctly or are confused.  Shortness of breath or difficulty breathing.  Dizziness and fainting.  You get a rash or develop hives.  You have a decrease in urine output.  Your urine turns a dark color or changes to pink, red, or brown. Any of the following symptoms occur over the next 10 days:  You have a temperature by mouth above 102 F (38.9 C), not controlled by medicine.  Shortness of breath.  Weakness after normal activity.  The white part of the eye turns yellow (jaundice).  You have a decrease in the amount of urine or are urinating less often.  Your urine turns a dark color or changes to pink, red, or brown. Document Released: 04/30/2000 Document Revised: 07/26/2011 Document Reviewed: 12/18/2007 [facility] Patient Information 2014 [place], [place].  _______________________________________________________________________

## 2018-10-12 ENCOUNTER — Encounter (HOSPITAL_COMMUNITY)
Admission: RE | Admit: 2018-10-12 | Discharge: 2018-10-12 | Disposition: A | Discharge status: Home / Self Care | Payer: PRIVATE HEALTH INSURANCE | Source: Ambulatory Visit | Attending: Orthopedic Surgery | Admitting: Orthopedic Surgery

## 2018-10-12 ENCOUNTER — Other Ambulatory Visit: Payer: Self-pay

## 2018-10-12 ENCOUNTER — Encounter (HOSPITAL_COMMUNITY): Payer: Self-pay

## 2018-10-12 DIAGNOSIS — M1611 Unilateral primary osteoarthritis, right hip: Secondary | ICD-10-CM | POA: Diagnosis not present

## 2018-10-12 DIAGNOSIS — Z01812 Encounter for preprocedural laboratory examination: Secondary | ICD-10-CM | POA: Diagnosis not present

## 2018-10-12 LAB — COMPREHENSIVE METABOLIC PANEL
ALT: 22 U/L (ref 0–44)
AST: 21 U/L (ref 15–41)
Albumin: 4.2 g/dL (ref 3.5–5.0)
Alkaline Phosphatase: 66 U/L (ref 38–126)
Anion gap: 11 (ref 5–15)
BUN: 23 mg/dL (ref 8–23)
CO2: 26 mmol/L (ref 22–32)
Calcium: 9.5 mg/dL (ref 8.9–10.3)
Chloride: 100 mmol/L (ref 98–111)
Creatinine, Ser: 0.82 mg/dL (ref 0.44–1.00)
GFR calc Af Amer: >60 mL/min (ref >60)
GFR calc non Af Amer: >60 mL/min (ref >60)
Glucose, Bld: 140 mg/dL — ABNORMAL HIGH (ref 70–99)
Potassium: 3.4 mmol/L — ABNORMAL LOW (ref 3.5–5.1)
Sodium: 137 mmol/L (ref 135–145)
Total Bilirubin: 0.6 mg/dL (ref 0.3–1.2)
Total Protein: 8 g/dL (ref 6.5–8.1)

## 2018-10-12 LAB — CBC
HCT: 45 % (ref 36.0–46.0)
Hemoglobin: 14.6 g/dL (ref 12.0–15.0)
MCH: 29.1 pg (ref 26.0–34.0)
MCHC: 32.4 g/dL (ref 30.0–36.0)
MCV: 89.6 fL (ref 80.0–100.0)
Platelets: 353 10*3/uL (ref 150–400)
RBC: 5.02 MIL/uL (ref 3.87–5.11)
RDW: 14.6 % (ref 11.5–15.5)
WBC: 8.4 10*3/uL (ref 4.0–10.5)
nRBC: 0 % (ref 0.0–0.2)

## 2018-10-12 LAB — SURGICAL PCR SCREEN
MRSA, PCR: NEGATIVE
Staphylococcus aureus: NEGATIVE

## 2018-10-12 LAB — APTT: aPTT: 33 seconds (ref 24–36)

## 2018-10-12 LAB — PROTIME-INR
INR: 1 (ref 0.8–1.2)
Prothrombin Time: 13.2 seconds (ref 11.4–15.2)

## 2018-10-12 NOTE — Progress Notes (Signed)
10-06-18 (Epic) Surgical Clearance and EKG   5-27=20 ( Epic) CXR

## 2018-10-13 LAB — ABO/RH: ABO/RH(D): A POS

## 2018-10-16 ENCOUNTER — Other Ambulatory Visit: Payer: Self-pay

## 2018-10-16 ENCOUNTER — Other Ambulatory Visit (HOSPITAL_COMMUNITY)
Admission: RE | Admit: 2018-10-16 | Discharge: 2018-10-16 | Disposition: A | Discharge status: Home / Self Care | Payer: No Typology Code available for payment source | Source: Ambulatory Visit | Attending: Orthopedic Surgery | Admitting: Orthopedic Surgery

## 2018-10-16 DIAGNOSIS — Z1159 Encounter for screening for other viral diseases: Secondary | ICD-10-CM | POA: Diagnosis not present

## 2018-10-16 LAB — SARS CORONAVIRUS 2 BY RT PCR (HOSPITAL ORDER, PERFORMED IN ~~LOC~~ HOSPITAL LAB): SARS Coronavirus 2: NEGATIVE

## 2018-10-17 NOTE — Anesthesia Preprocedure Evaluation (Addendum)
Anesthesia Evaluation  Patient identified by MRN, date of birth, ID band Patient awake    Reviewed: Allergy & Precautions, NPO status , Patient's Chart, lab work & pertinent test results, reviewed documented beta blocker date and time   History of Anesthesia Complications Negative for: history of anesthetic complications  Airway Mallampati: II  TM Distance: >3 FB Neck ROM: Full    Dental no notable dental hx.    Pulmonary neg pulmonary ROS,    Pulmonary exam normal        Cardiovascular hypertension, Pt. on medications and Pt. on home beta blockers Normal cardiovascular exam     Neuro/Psych negative neurological ROS     GI/Hepatic Neg liver ROS, GERD  ,  Endo/Other  Obese, BMI 36  Renal/GU negative Renal ROS     Musculoskeletal negative musculoskeletal ROS (+)   Abdominal   Peds  Hematology negative hematology ROS (+)   Anesthesia Other Findings Day of surgery medications reviewed with the patient.  Reproductive/Obstetrics                            Anesthesia Physical Anesthesia Plan  ASA: II  Anesthesia Plan: Spinal   Post-op Pain Management:    Induction:   PONV Risk Score and Plan: 3 and Treatment may vary due to age or medical condition, Ondansetron, Propofol infusion and Dexamethasone  Airway Management Planned: Natural Airway and Simple Face Mask  Additional Equipment:   Intra-op Plan:   Post-operative Plan:   Informed Consent: I have reviewed the patients History and Physical, chart, labs and discussed the procedure including the risks, benefits and alternatives for the proposed anesthesia with the patient or authorized representative who has indicated his/her understanding and acceptance.     Dental advisory given  Plan Discussed with: CRNA  Anesthesia Plan Comments:        Anesthesia Quick Evaluation

## 2018-10-17 NOTE — Progress Notes (Signed)
SPOKE W/  _ Erika Wolf     SCREENING SYMPTOMS OF COVID 19:   COUGH--NO  RUNNY NOSE--- NO  SORE THROAT---NO  NASAL CONGESTION----NO  SNEEZING----NO  SHORTNESS OF BREATH---NO  DIFFICULTY BREATHING---NO  TEMP >100.0 -----NO  UNEXPLAINED BODY ACHES------NO  CHILLS -------- NO  HEADACHES ---------NO  LOSS OF SMELL/ TASTE --------NO   HAVE YOU OR ANY FAMILY MEMBER TRAVELLED PAST 14 DAYS OUT OF THE   COUNTY---NO STATE----NO COUNTRY----NO  HAVE YOU OR ANY FAMILY MEMBER BEEN EXPOSED TO ANYONE WITH COVID 19? NO

## 2018-10-18 ENCOUNTER — Encounter (HOSPITAL_COMMUNITY)
Admission: RE | Disposition: A | Discharge status: Home / Self Care | Payer: Self-pay | Source: Home / Self Care | Attending: Orthopedic Surgery

## 2018-10-18 ENCOUNTER — Inpatient Hospital Stay (HOSPITAL_COMMUNITY): Payer: PRIVATE HEALTH INSURANCE | Admitting: Physician Assistant

## 2018-10-18 ENCOUNTER — Other Ambulatory Visit: Payer: Self-pay

## 2018-10-18 ENCOUNTER — Inpatient Hospital Stay (HOSPITAL_COMMUNITY): Payer: PRIVATE HEALTH INSURANCE

## 2018-10-18 ENCOUNTER — Encounter (HOSPITAL_COMMUNITY): Payer: Self-pay | Admitting: General Practice

## 2018-10-18 ENCOUNTER — Inpatient Hospital Stay (HOSPITAL_COMMUNITY): Payer: PRIVATE HEALTH INSURANCE | Admitting: Certified Registered Nurse Anesthetist

## 2018-10-18 ENCOUNTER — Inpatient Hospital Stay (HOSPITAL_COMMUNITY)
Admission: RE | Admit: 2018-10-18 | Discharge: 2018-10-19 | DRG: 470 | Disposition: A | Discharge status: Home / Self Care | Payer: PRIVATE HEALTH INSURANCE | Attending: Orthopedic Surgery | Admitting: Orthopedic Surgery

## 2018-10-18 DIAGNOSIS — Z90722 Acquired absence of ovaries, bilateral: Secondary | ICD-10-CM

## 2018-10-18 DIAGNOSIS — Z9181 History of falling: Secondary | ICD-10-CM | POA: Diagnosis not present

## 2018-10-18 DIAGNOSIS — K219 Gastro-esophageal reflux disease without esophagitis: Secondary | ICD-10-CM | POA: Diagnosis present

## 2018-10-18 DIAGNOSIS — Z6836 Body mass index (BMI) 36.0-36.9, adult: Secondary | ICD-10-CM | POA: Diagnosis not present

## 2018-10-18 DIAGNOSIS — Z90710 Acquired absence of both cervix and uterus: Secondary | ICD-10-CM

## 2018-10-18 DIAGNOSIS — Z419 Encounter for procedure for purposes other than remedying health state, unspecified: Secondary | ICD-10-CM

## 2018-10-18 DIAGNOSIS — Z79890 Hormone replacement therapy (postmenopausal): Secondary | ICD-10-CM

## 2018-10-18 DIAGNOSIS — Z79899 Other long term (current) drug therapy: Secondary | ICD-10-CM

## 2018-10-18 DIAGNOSIS — M1611 Unilateral primary osteoarthritis, right hip: Secondary | ICD-10-CM | POA: Diagnosis present

## 2018-10-18 DIAGNOSIS — M169 Osteoarthritis of hip, unspecified: Secondary | ICD-10-CM | POA: Diagnosis present

## 2018-10-18 DIAGNOSIS — Z7951 Long term (current) use of inhaled steroids: Secondary | ICD-10-CM | POA: Diagnosis not present

## 2018-10-18 DIAGNOSIS — Z780 Asymptomatic menopausal state: Secondary | ICD-10-CM | POA: Diagnosis not present

## 2018-10-18 DIAGNOSIS — E669 Obesity, unspecified: Secondary | ICD-10-CM | POA: Diagnosis present

## 2018-10-18 DIAGNOSIS — I10 Essential (primary) hypertension: Secondary | ICD-10-CM | POA: Diagnosis present

## 2018-10-18 DIAGNOSIS — M25751 Osteophyte, right hip: Secondary | ICD-10-CM | POA: Diagnosis present

## 2018-10-18 DIAGNOSIS — Z96649 Presence of unspecified artificial hip joint: Secondary | ICD-10-CM

## 2018-10-18 HISTORY — PX: TOTAL HIP ARTHROPLASTY: SHX124

## 2018-10-18 LAB — TYPE AND SCREEN
ABO/RH(D): A POS
Antibody Screen: NEGATIVE

## 2018-10-18 SURGERY — ARTHROPLASTY, HIP, TOTAL, ANTERIOR APPROACH
Anesthesia: Spinal | Laterality: Right

## 2018-10-18 MED ORDER — POLYETHYLENE GLYCOL 3350 17 G PO PACK: 17.0000 g | PACK | Freq: Every day | ORAL | Status: DC | PRN

## 2018-10-18 MED ORDER — OXYCODONE HCL 5 MG PO TABS: 5.0000 mg | ORAL_TABLET | Freq: Once | ORAL | Status: DC | PRN

## 2018-10-18 MED ORDER — CHLORHEXIDINE GLUCONATE 4 % EX LIQD: 60.0000 mL | Freq: Once | CUTANEOUS | Status: DC

## 2018-10-18 MED ORDER — EPHEDRINE SULFATE-NACL 50-0.9 MG/10ML-% IV SOSY
PREFILLED_SYRINGE | INTRAVENOUS | Status: DC | PRN
  Administered 2018-10-18: 5 mg via INTRAVENOUS
  Administered 2018-10-18: 10 mg via INTRAVENOUS

## 2018-10-18 MED ORDER — ONDANSETRON HCL 4 MG PO TABS: 4.0000 mg | ORAL_TABLET | Freq: Four times a day (QID) | ORAL | Status: DC | PRN

## 2018-10-18 MED ORDER — LORATADINE 10 MG PO TABS
10.0000 mg | ORAL_TABLET | Freq: Every day | ORAL | Status: DC
  Administered 2018-10-19: 10 mg via ORAL
  Filled 2018-10-18: qty 1

## 2018-10-18 MED ORDER — PHENYLEPHRINE 40 MCG/ML (10ML) SYRINGE FOR IV PUSH (FOR BLOOD PRESSURE SUPPORT)
PREFILLED_SYRINGE | INTRAVENOUS | Status: AC
  Filled 2018-10-18: qty 10

## 2018-10-18 MED ORDER — SODIUM CHLORIDE 0.9 % IV SOLN
INTRAVENOUS | Status: DC
  Administered 2018-10-18 – 2018-10-19 (×2): via INTRAVENOUS

## 2018-10-18 MED ORDER — DOCUSATE SODIUM 100 MG PO CAPS
100.0000 mg | ORAL_CAPSULE | Freq: Two times a day (BID) | ORAL | Status: DC
  Administered 2018-10-18 – 2018-10-19 (×3): 100 mg via ORAL
  Filled 2018-10-18 (×3): qty 1

## 2018-10-18 MED ORDER — DEXAMETHASONE SODIUM PHOSPHATE 10 MG/ML IJ SOLN
INTRAMUSCULAR | Status: AC
  Filled 2018-10-18: qty 1

## 2018-10-18 MED ORDER — PHENOL 1.4 % MT LIQD
1.0000 | OROMUCOSAL | Status: DC | PRN
  Filled 2018-10-18: qty 177

## 2018-10-18 MED ORDER — FENTANYL CITRATE (PF) 100 MCG/2ML IJ SOLN
INTRAMUSCULAR | Status: AC
  Filled 2018-10-18: qty 4

## 2018-10-18 MED ORDER — DEXMEDETOMIDINE HCL IN NACL 200 MCG/50ML IV SOLN
INTRAVENOUS | Status: AC
  Filled 2018-10-18: qty 50

## 2018-10-18 MED ORDER — ACETAMINOPHEN 10 MG/ML IV SOLN: 1000.0000 mg | Freq: Once | INTRAVENOUS | Status: DC | PRN

## 2018-10-18 MED ORDER — BUPIVACAINE-EPINEPHRINE (PF) 0.25% -1:200000 IJ SOLN
INTRAMUSCULAR | Status: DC | PRN
  Administered 2018-10-18: 30 mL

## 2018-10-18 MED ORDER — FLEET ENEMA 7-19 GM/118ML RE ENEM: 1.0000 | ENEMA | Freq: Once | RECTAL | Status: DC | PRN

## 2018-10-18 MED ORDER — PANTOPRAZOLE SODIUM 40 MG PO TBEC
40.0000 mg | DELAYED_RELEASE_TABLET | Freq: Every day | ORAL | Status: DC
  Administered 2018-10-19: 40 mg via ORAL
  Filled 2018-10-18: qty 1

## 2018-10-18 MED ORDER — DEXAMETHASONE SODIUM PHOSPHATE 10 MG/ML IJ SOLN
10.0000 mg | Freq: Once | INTRAMUSCULAR | Status: AC
  Administered 2018-10-19: 10 mg via INTRAVENOUS
  Filled 2018-10-18: qty 1

## 2018-10-18 MED ORDER — PROPOFOL 10 MG/ML IV BOLUS
INTRAVENOUS | Status: AC
  Filled 2018-10-18: qty 20

## 2018-10-18 MED ORDER — MENTHOL 3 MG MT LOZG: 1.0000 | LOZENGE | OROMUCOSAL | Status: DC | PRN

## 2018-10-18 MED ORDER — CEFAZOLIN SODIUM-DEXTROSE 2-4 GM/100ML-% IV SOLN
2.0000 g | Freq: Four times a day (QID) | INTRAVENOUS | Status: AC
  Administered 2018-10-18 (×2): 2 g via INTRAVENOUS
  Filled 2018-10-18 (×3): qty 100

## 2018-10-18 MED ORDER — OXYCODONE HCL 5 MG/5ML PO SOLN: 5.0000 mg | Freq: Once | ORAL | Status: DC | PRN

## 2018-10-18 MED ORDER — PROMETHAZINE HCL 25 MG/ML IJ SOLN: 6.2500 mg | INTRAMUSCULAR | Status: DC | PRN

## 2018-10-18 MED ORDER — FENTANYL CITRATE (PF) 100 MCG/2ML IJ SOLN
INTRAMUSCULAR | Status: DC | PRN
  Administered 2018-10-18: 100 ug via INTRAVENOUS

## 2018-10-18 MED ORDER — LOSARTAN POTASSIUM 50 MG PO TABS
100.0000 mg | ORAL_TABLET | Freq: Every day | ORAL | Status: DC
  Administered 2018-10-19: 100 mg via ORAL
  Filled 2018-10-18: qty 2

## 2018-10-18 MED ORDER — METHOCARBAMOL 500 MG IVPB - SIMPLE MED
500.0000 mg | Freq: Four times a day (QID) | INTRAVENOUS | Status: DC | PRN
  Filled 2018-10-18: qty 50

## 2018-10-18 MED ORDER — PHENYLEPHRINE 40 MCG/ML (10ML) SYRINGE FOR IV PUSH (FOR BLOOD PRESSURE SUPPORT)
PREFILLED_SYRINGE | INTRAVENOUS | Status: DC | PRN
  Administered 2018-10-18: 80 ug via INTRAVENOUS

## 2018-10-18 MED ORDER — FLUTICASONE PROPIONATE 50 MCG/ACT NA SUSP
1.0000 | Freq: Every day | NASAL | Status: DC
  Filled 2018-10-18: qty 16

## 2018-10-18 MED ORDER — METOCLOPRAMIDE HCL 5 MG/ML IJ SOLN
5.0000 mg | Freq: Three times a day (TID) | INTRAMUSCULAR | Status: DC | PRN
  Administered 2018-10-18: 10 mg via INTRAVENOUS
  Filled 2018-10-18: qty 2

## 2018-10-18 MED ORDER — BISACODYL 10 MG RE SUPP: 10.0000 mg | Freq: Every day | RECTAL | Status: DC | PRN

## 2018-10-18 MED ORDER — 0.9 % SODIUM CHLORIDE (POUR BTL) OPTIME
TOPICAL | Status: DC | PRN
  Administered 2018-10-18: 09:00:00 1000 mL

## 2018-10-18 MED ORDER — HYDROCODONE-ACETAMINOPHEN 5-325 MG PO TABS
1.0000 | ORAL_TABLET | ORAL | Status: DC | PRN
  Administered 2018-10-18: 2 via ORAL
  Administered 2018-10-18: 14:00:00 1 via ORAL
  Administered 2018-10-18: 2 via ORAL
  Administered 2018-10-18: 1 via ORAL
  Administered 2018-10-19: 2 via ORAL
  Administered 2018-10-19 (×2): 1 via ORAL
  Filled 2018-10-18: qty 2
  Filled 2018-10-18 (×2): qty 1
  Filled 2018-10-18 (×4): qty 2

## 2018-10-18 MED ORDER — ASPIRIN EC 325 MG PO TBEC: 325.0000 mg | DELAYED_RELEASE_TABLET | Freq: Two times a day (BID) | ORAL | Status: DC

## 2018-10-18 MED ORDER — METHOCARBAMOL 500 MG PO TABS
500.0000 mg | ORAL_TABLET | Freq: Four times a day (QID) | ORAL | Status: DC | PRN
  Administered 2018-10-18 – 2018-10-19 (×3): 500 mg via ORAL
  Filled 2018-10-18 (×4): qty 1

## 2018-10-18 MED ORDER — EPHEDRINE 5 MG/ML INJ
INTRAVENOUS | Status: AC
  Filled 2018-10-18: qty 10

## 2018-10-18 MED ORDER — MIDAZOLAM HCL 2 MG/2ML IJ SOLN
INTRAMUSCULAR | Status: AC
  Filled 2018-10-18: qty 2

## 2018-10-18 MED ORDER — TRAMADOL HCL 50 MG PO TABS
50.0000 mg | ORAL_TABLET | Freq: Four times a day (QID) | ORAL | Status: DC | PRN
  Administered 2018-10-18 (×2): 100 mg via ORAL
  Filled 2018-10-18 (×2): qty 2

## 2018-10-18 MED ORDER — ACETAMINOPHEN 10 MG/ML IV SOLN
1000.0000 mg | Freq: Four times a day (QID) | INTRAVENOUS | Status: DC
  Administered 2018-10-18: 1000 mg via INTRAVENOUS
  Filled 2018-10-18: qty 100

## 2018-10-18 MED ORDER — DEXAMETHASONE SODIUM PHOSPHATE 10 MG/ML IJ SOLN
8.0000 mg | Freq: Once | INTRAMUSCULAR | Status: AC
  Administered 2018-10-18: 8 mg via INTRAVENOUS

## 2018-10-18 MED ORDER — TRANEXAMIC ACID-NACL 1000-0.7 MG/100ML-% IV SOLN
1000.0000 mg | INTRAVENOUS | Status: AC
  Administered 2018-10-18: 1000 mg via INTRAVENOUS
  Filled 2018-10-18: qty 100

## 2018-10-18 MED ORDER — STERILE WATER FOR IRRIGATION IR SOLN
Status: DC | PRN
  Administered 2018-10-18: 2000 mL

## 2018-10-18 MED ORDER — PROPOFOL 500 MG/50ML IV EMUL
INTRAVENOUS | Status: DC | PRN
  Administered 2018-10-18: 75 ug/kg/min via INTRAVENOUS

## 2018-10-18 MED ORDER — ONDANSETRON HCL 4 MG/2ML IJ SOLN
INTRAMUSCULAR | Status: AC
  Filled 2018-10-18: qty 2

## 2018-10-18 MED ORDER — PROPOFOL 10 MG/ML IV BOLUS
INTRAVENOUS | Status: AC
  Filled 2018-10-18: qty 60

## 2018-10-18 MED ORDER — ACETAMINOPHEN 500 MG PO TABS
500.0000 mg | ORAL_TABLET | Freq: Four times a day (QID) | ORAL | Status: AC
  Administered 2018-10-18 (×2): 500 mg via ORAL
  Filled 2018-10-18 (×4): qty 1

## 2018-10-18 MED ORDER — METOCLOPRAMIDE HCL 5 MG PO TABS: 5.0000 mg | ORAL_TABLET | Freq: Three times a day (TID) | ORAL | Status: DC | PRN

## 2018-10-18 MED ORDER — FENTANYL CITRATE (PF) 100 MCG/2ML IJ SOLN
INTRAMUSCULAR | Status: AC
  Filled 2018-10-18: qty 2

## 2018-10-18 MED ORDER — LACTATED RINGERS IV SOLN
INTRAVENOUS | Status: DC
  Administered 2018-10-18 (×2): via INTRAVENOUS

## 2018-10-18 MED ORDER — METHOCARBAMOL 500 MG IVPB - SIMPLE MED
INTRAVENOUS | Status: AC
  Administered 2018-10-18: 500 mg
  Filled 2018-10-18: qty 50

## 2018-10-18 MED ORDER — ONDANSETRON HCL 4 MG/2ML IJ SOLN
INTRAMUSCULAR | Status: DC | PRN
  Administered 2018-10-18: 4 mg via INTRAVENOUS

## 2018-10-18 MED ORDER — CEFAZOLIN SODIUM-DEXTROSE 2-4 GM/100ML-% IV SOLN
2.0000 g | INTRAVENOUS | Status: AC
  Administered 2018-10-18: 2 g via INTRAVENOUS
  Filled 2018-10-18: qty 100

## 2018-10-18 MED ORDER — DIPHENHYDRAMINE HCL 12.5 MG/5ML PO ELIX: 12.5000 mg | ORAL_SOLUTION | ORAL | Status: DC | PRN

## 2018-10-18 MED ORDER — METOPROLOL TARTRATE 50 MG PO TABS
100.0000 mg | ORAL_TABLET | Freq: Two times a day (BID) | ORAL | Status: DC
  Administered 2018-10-18 – 2018-10-19 (×2): 100 mg via ORAL
  Filled 2018-10-18 (×3): qty 2

## 2018-10-18 MED ORDER — FENTANYL CITRATE (PF) 100 MCG/2ML IJ SOLN
25.0000 ug | INTRAMUSCULAR | Status: DC | PRN
  Administered 2018-10-18 (×3): 50 ug via INTRAVENOUS

## 2018-10-18 MED ORDER — MIDAZOLAM HCL 5 MG/5ML IJ SOLN
INTRAMUSCULAR | Status: DC | PRN
  Administered 2018-10-18: 2 mg via INTRAVENOUS

## 2018-10-18 MED ORDER — BUPIVACAINE IN DEXTROSE 0.75-8.25 % IT SOLN
INTRATHECAL | Status: DC | PRN
  Administered 2018-10-18: 1.6 mL via INTRATHECAL

## 2018-10-18 MED ORDER — HYDROCHLOROTHIAZIDE 25 MG PO TABS
25.0000 mg | ORAL_TABLET | Freq: Every day | ORAL | Status: DC
  Administered 2018-10-19: 25 mg via ORAL
  Filled 2018-10-18: qty 1

## 2018-10-18 MED ORDER — BUPIVACAINE-EPINEPHRINE (PF) 0.25% -1:200000 IJ SOLN
INTRAMUSCULAR | Status: AC
  Filled 2018-10-18: qty 30

## 2018-10-18 MED ORDER — MORPHINE SULFATE (PF) 4 MG/ML IV SOLN
0.5000 mg | INTRAVENOUS | Status: DC | PRN
  Administered 2018-10-18 (×2): 1 mg via INTRAVENOUS
  Filled 2018-10-18 (×2): qty 1

## 2018-10-18 MED ORDER — ONDANSETRON HCL 4 MG/2ML IJ SOLN: 4.0000 mg | Freq: Four times a day (QID) | INTRAMUSCULAR | Status: DC | PRN

## 2018-10-18 SURGICAL SUPPLY — 44 items
BAG DECANTER FOR FLEXI CONT (MISCELLANEOUS) IMPLANT
BAG ZIPLOCK 12X15 (MISCELLANEOUS) IMPLANT
BLADE SAG 18X100X1.27 (BLADE) ×2 IMPLANT
COVER PERINEAL POST (MISCELLANEOUS) ×2 IMPLANT
COVER SURGICAL LIGHT HANDLE (MISCELLANEOUS) ×2 IMPLANT
COVER WAND RF STERILE (DRAPES) IMPLANT
CUP ACETBLR 48 OD SECTOR II (Hips) ×2 IMPLANT
DECANTER SPIKE VIAL GLASS SM (MISCELLANEOUS) ×2 IMPLANT
DRAPE STERI IOBAN 125X83 (DRAPES) ×2 IMPLANT
DRAPE U-SHAPE 47X51 STRL (DRAPES) ×4 IMPLANT
DRSG ADAPTIC 3X8 NADH LF (GAUZE/BANDAGES/DRESSINGS) ×2 IMPLANT
DRSG MEPILEX BORDER 4X4 (GAUZE/BANDAGES/DRESSINGS) ×2 IMPLANT
DRSG MEPILEX BORDER 4X8 (GAUZE/BANDAGES/DRESSINGS) ×2 IMPLANT
DURAPREP 26ML APPLICATOR (WOUND CARE) ×2 IMPLANT
ELECT REM PT RETURN 15FT ADLT (MISCELLANEOUS) ×2 IMPLANT
EVACUATOR 1/8 PVC DRAIN (DRAIN) ×2 IMPLANT
GLOVE BIO SURGEON STRL SZ 6 (GLOVE) IMPLANT
GLOVE BIO SURGEON STRL SZ7 (GLOVE) IMPLANT
GLOVE BIO SURGEON STRL SZ8 (GLOVE) ×2 IMPLANT
GLOVE BIOGEL PI IND STRL 6.5 (GLOVE) ×1 IMPLANT
GLOVE BIOGEL PI IND STRL 7.0 (GLOVE) ×2 IMPLANT
GLOVE BIOGEL PI IND STRL 8 (GLOVE) ×1 IMPLANT
GLOVE BIOGEL PI INDICATOR 6.5 (GLOVE) ×1
GLOVE BIOGEL PI INDICATOR 7.0 (GLOVE) ×2
GLOVE BIOGEL PI INDICATOR 8 (GLOVE) ×1
GOWN STRL REUS W/TWL LRG LVL3 (GOWN DISPOSABLE) ×6 IMPLANT
GOWN STRL REUS W/TWL XL LVL3 (GOWN DISPOSABLE) ×2 IMPLANT
HEAD CERAMIC DELTA 28 P1.5 HIP (Head) ×2 IMPLANT
HOLDER FOLEY CATH W/STRAP (MISCELLANEOUS) ×2 IMPLANT
KIT TURNOVER KIT A (KITS) ×2 IMPLANT
LINER MARATHON 28 48 (Hips) ×2 IMPLANT
MANIFOLD NEPTUNE II (INSTRUMENTS) ×2 IMPLANT
PACK ANTERIOR HIP CUSTOM (KITS) ×2 IMPLANT
STEM FEM ACTIS HIGH SZ3 (Stem) ×2 IMPLANT
STRIP CLOSURE SKIN 1/2X4 (GAUZE/BANDAGES/DRESSINGS) ×2 IMPLANT
SUT ETHIBOND NAB CT1 #1 30IN (SUTURE) ×2 IMPLANT
SUT MNCRL AB 4-0 PS2 18 (SUTURE) ×2 IMPLANT
SUT STRATAFIX 0 PDS 27 VIOLET (SUTURE) ×4
SUT VIC AB 2-0 CT1 27 (SUTURE) ×2
SUT VIC AB 2-0 CT1 TAPERPNT 27 (SUTURE) ×2 IMPLANT
SUTURE STRATFX 0 PDS 27 VIOLET (SUTURE) ×2 IMPLANT
SYR 50ML LL SCALE MARK (SYRINGE) IMPLANT
TRAY FOLEY MTR SLVR 16FR STAT (SET/KITS/TRAYS/PACK) ×2 IMPLANT
YANKAUER SUCT BULB TIP 10FT TU (MISCELLANEOUS) ×2 IMPLANT

## 2018-10-18 NOTE — Anesthesia Procedure Notes (Signed)
Procedure Name: MAC Date/Time: 10/18/2018 8:21 AM Performed by: Lollie Sails, CRNA Pre-anesthesia Checklist: Patient identified, Emergency Drugs available, Suction available and Patient being monitored Oxygen Delivery Method: Simple face mask

## 2018-10-18 NOTE — Transfer of Care (Signed)
Immediate Anesthesia Transfer of Care Note  Patient: Erika Wolf  Procedure(s) Performed: TOTAL HIP ARTHROPLASTY ANTERIOR APPROACH (Right )  Patient Location: PACU  Anesthesia Type:Spinal  Level of Consciousness: awake and patient cooperative  Airway & Oxygen Therapy: Patient connected to face mask oxygen  Post-op Assessment: Report given to RN and Post -op Vital signs reviewed and stable  Post vital signs: Reviewed and stable  Last Vitals:  Vitals Value Taken Time  BP 129/76 10/18/2018 10:15 AM  Temp    Pulse 80 10/18/2018 10:16 AM  Resp 21 10/18/2018 10:16 AM  SpO2 100 % 10/18/2018 10:16 AM  Vitals shown include unvalidated device data.  Last Pain:  Vitals:   10/18/18 0701  TempSrc:   PainSc: 0-No pain         Complications: No apparent anesthesia complications

## 2018-10-18 NOTE — Anesthesia Postprocedure Evaluation (Signed)
Anesthesia Post Note  Patient: Erika Wolf  Procedure(s) Performed: TOTAL HIP ARTHROPLASTY ANTERIOR APPROACH (Right )     Patient location during evaluation: PACU Anesthesia Type: Spinal Level of consciousness: awake and alert Pain management: pain level controlled Vital Signs Assessment: post-procedure vital signs reviewed and stable Respiratory status: spontaneous breathing, nonlabored ventilation and respiratory function stable Cardiovascular status: blood pressure returned to baseline and stable Postop Assessment: no apparent nausea or vomiting and spinal receding Anesthetic complications: no    Last Vitals:  Vitals:   10/18/18 1122 10/18/18 1146  BP: 129/73 (!) 144/75  Pulse: 76 74  Resp: 12 14  Temp: 36.5 C 36.7 C  SpO2: 100% 99%    Last Pain:  Vitals:   10/18/18 1146  TempSrc: Oral  PainSc: 8                  Kaylyn Layer

## 2018-10-18 NOTE — Interval H&P Note (Signed)
History and Physical Interval Note:  10/18/2018 8:11 AM  Erika Wolf  has presented today for surgery, with the diagnosis of right hip osteoarthritis.  The various methods of treatment have been discussed with the patient and family. After consideration of risks, benefits and other options for treatment, the patient has consented to  Procedure(s) with comments: TOTAL HIP ARTHROPLASTY ANTERIOR APPROACH (Right) - as a surgical intervention.  The patient's history has been reviewed, patient examined, no change in status, stable for surgery.  I have reviewed the patient's chart and labs.  Questions were answered to the patient's satisfaction.     Homero Fellers [name]

## 2018-10-18 NOTE — Anesthesia Procedure Notes (Signed)
Spinal  Patient location during procedure: OR Start time: 10/18/2018 8:23 AM End time: 10/18/2018 8:28 AM Staffing Resident/CRNA: Lollie Sails, CRNA Performed: resident/CRNA  Preanesthetic Checklist Completed: patient identified, site marked, surgical consent, pre-op evaluation, timeout performed, IV checked, risks and benefits discussed and monitors and equipment checked Spinal Block Patient position: sitting Prep: site prepped and draped and DuraPrep Patient monitoring: heart rate, continuous pulse ox, blood pressure and cardiac monitor Approach: midline Location: L2-3 Injection technique: single-shot Needle Needle type: Sprotte  Needle gauge: 24 G Needle length: 10 cm Additional Notes Expiration date on kit noted and within range.  Good CSF flow noted with no heme or c/o paresthesia.   Patient tolerated well.

## 2018-10-18 NOTE — Progress Notes (Signed)
Pt transported to 1303 with walker and 1 bag of belongings.  Spouse, Dan, notified and given room and phone number.

## 2018-10-18 NOTE — Discharge Instructions (Addendum)
°Dr. Frank Aluisio °Total Joint Specialist °[facility] °[address] °[place], [place] [address] °[phone] ° °ANTERIOR APPROACH TOTAL HIP REPLACEMENT POSTOPERATIVE DIRECTIONS ° ° °Hip Rehabilitation, Guidelines Following Surgery  °The results of a hip operation are greatly improved after range of motion and muscle strengthening exercises. Follow all safety measures which are given to protect your hip. If any of these exercises cause increased pain or swelling in your joint, decrease the amount until you are comfortable again. Then slowly increase the exercises. Call your caregiver if you have problems or questions.  ° °HOME CARE INSTRUCTIONS  °• Remove items at home which could result in a fall. This includes throw rugs or furniture in walking pathways.  °· ICE to the affected hip every three hours for 30 minutes at a time and then as needed for pain and swelling.  Continue to use ice on the hip for pain and swelling from surgery. You may notice swelling that will progress down to the foot and ankle.  This is normal after surgery.  Elevate the leg when you are not up walking on it.   °· Continue to use the breathing machine which will help keep your temperature down.  It is common for your temperature to cycle up and down following surgery, especially at night when you are not up moving around and exerting yourself.  The breathing machine keeps your lungs expanded and your temperature down. ° °DIET °You may resume your previous home diet once your are discharged from the hospital. ° °DRESSING / WOUND CARE / SHOWERING °You may change your dressing 3-5 days after surgery.  Then change the dressing every day with sterile gauze.  Please use good hand washing techniques before changing the dressing.  Do not use any lotions or creams on the incision until instructed by your surgeon. °You may start showering once you are discharged home but do not submerge the incision under water. Just pat the  incision dry and apply a dry gauze dressing on daily. °Change the surgical dressing daily and reapply a dry dressing each time. ° °ACTIVITY °Walk with your walker as instructed. °Use walker as long as suggested by your caregivers. °Avoid periods of inactivity such as sitting longer than an hour when not asleep. This helps prevent blood clots.  °You may resume a sexual relationship in one month or when given the OK by your doctor.  °You may return to work once you are cleared by your doctor.  °Do not drive a car for 6 weeks or until released by you surgeon.  °Do not drive while taking narcotics. ° °WEIGHT BEARING °Weight bearing as tolerated with assist device (walker, cane, etc) as directed, use it as long as suggested by your surgeon or therapist, typically at least 4-6 weeks. ° °POSTOPERATIVE CONSTIPATION PROTOCOL °Constipation - defined medically as fewer than three stools per week and severe constipation as less than one stool per week. ° °One of the most common issues patients have following surgery is constipation.  Even if you have a regular bowel pattern at home, your normal regimen is likely to be disrupted due to multiple reasons following surgery.  Combination of anesthesia, postoperative narcotics, change in appetite and fluid intake all can affect your bowels.  In order to avoid complications following surgery, here are some recommendations in order to help you during your recovery period. ° °Colace (docusate) - Pick up an over-the-counter form of Colace or another stool softener and take twice a day   as long as you are requiring postoperative pain medications.  Take with a full glass of water daily.  If you experience loose stools or diarrhea, hold the colace until you stool forms back up.  If your symptoms do not get better within 1 week or if they get worse, check with your doctor. ° °Dulcolax (bisacodyl) - Pick up over-the-counter and take as directed by the product packaging as needed to assist with  the movement of your bowels.  Take with a full glass of water.  Use this product as needed if not relieved by Colace only.  ° °MiraLax (polyethylene glycol) - Pick up over-the-counter to have on hand.  MiraLax is a solution that will increase the amount of water in your bowels to assist with bowel movements.  Take as directed and can mix with a glass of water, juice, soda, coffee, or tea.  Take if you go more than two days without a movement. °Do not use MiraLax more than once per day. Call your doctor if you are still constipated or irregular after using this medication for 7 days in a row. ° °If you continue to have problems with postoperative constipation, please contact the office for further assistance and recommendations.  If you experience "the worst abdominal pain ever" or develop nausea or vomiting, please contact the office immediatly for further recommendations for treatment. ° °ITCHING ° If you experience itching with your medications, try taking only a single pain pill, or even half a pain pill at a time.  You can also use Benadryl over the counter for itching or also to help with sleep.  ° °TED HOSE STOCKINGS °Wear the elastic stockings on both legs for three weeks following surgery during the day but you may remove then at night for sleeping. ° °MEDICATIONS °See your medication summary on the “After Visit Summary” that the nursing staff will review with you prior to discharge.  You may have some home medications which will be placed on hold until you complete the course of blood thinner medication.  It is important for you to complete the blood thinner medication as prescribed by your surgeon.  Continue your approved medications as instructed at time of discharge. ° °PRECAUTIONS °If you experience chest pain or shortness of breath - call 911 immediately for transfer to the hospital emergency department.  °If you develop a fever greater that 101 F, purulent drainage from wound, increased redness or  drainage from wound, foul odor from the wound/dressing, or calf pain - CONTACT YOUR SURGEON.   °                                                °FOLLOW-UP APPOINTMENTS °Make sure you keep all of your appointments after your operation with your surgeon and caregivers. You should call the office at the above phone number and make an appointment for approximately two weeks after the date of your surgery or on the date instructed by your surgeon outlined in the "After Visit Summary". ° °RANGE OF MOTION AND STRENGTHENING EXERCISES  °These exercises are designed to help you keep full movement of your hip joint. Follow your caregiver's or physical therapist's instructions. Perform all exercises about fifteen times, three times per day or as directed. Exercise both hips, even if you have had only one joint replacement. These exercises can be done on   a training (exercise) mat, on the floor, on a table or on a bed. Use whatever works the best and is most comfortable for you. Use music or television while you are exercising so that the exercises are a pleasant break in your day. This will make your life better with the exercises acting as a break in routine you can look forward to.   Lying on your back, slowly slide your foot toward your buttocks, raising your knee up off the floor. Then slowly slide your foot back down until your leg is straight again.   Lying on your back spread your legs as far apart as you can without causing discomfort.   Lying on your side, raise your upper leg and foot straight up from the floor as far as is comfortable. Slowly lower the leg and repeat.   Lying on your back, tighten up the muscle in the front of your thigh (quadriceps muscles). You can do this by keeping your leg straight and trying to raise your heel off the floor. This helps strengthen the largest muscle supporting your knee.   Lying on your back, tighten up the muscles of your buttocks both with the legs straight and with  the knee bent at a comfortable angle while keeping your heel on the floor.   IF YOU ARE TRANSFERRED TO A SKILLED REHAB FACILITY If the patient is transferred to a skilled rehab facility following release from the hospital, a list of the current medications will be sent to the facility for the patient to continue.  When discharged from the skilled rehab facility, please have the facility set up the patient's Home Health Physical Therapy prior to being released. Also, the skilled facility will be responsible for providing the patient with their medications at time of release from the facility to include their pain medication, the muscle relaxants, and their blood thinner medication. If the patient is still at the rehab facility at time of the two week follow up appointment, the skilled rehab facility will also need to assist the patient in arranging follow up appointment in our office and any transportation needs.  MAKE SURE YOU:   Understand these instructions.   Get help right away if you are not doing well or get worse.    Pick up stool softner and laxative for home use following surgery while on pain medications. Do not submerge incision under water. Please use good hand washing techniques while changing dressing each day. May shower starting three days after surgery. Please use a clean towel to pat the incision dry following showers. Continue to use ice for pain and swelling after surgery. Do not use any lotions or creams on the incision until instructed by your surgeon.  -----------------------------------------------------------------------------------------  Information on my medicine - XARELTO (Rivaroxaban)  This medication education was reviewed with me or my healthcare representative as part of my discharge preparation.  The pharmacist that spoke with me during my hospital stay was:  Ulyses Southward, RPH-CPP  Why was Xarelto prescribed for you? Xarelto was prescribed for you to  reduce the risk of blood clots forming after orthopedic surgery. The medical term for these abnormal blood clots is venous thromboembolism (VTE).  What do you need to know about xarelto ? Take your Xarelto ONCE DAILY at the same time every day. You may take it either with or without food.  If you have difficulty swallowing the tablet whole, you may crush it and mix in applesauce just prior to taking your  dose.  Take Xarelto exactly as prescribed by your doctor and DO NOT stop taking Xarelto without talking to the doctor who prescribed the medication.  Stopping without other VTE prevention medication to take the place of Xarelto may increase your risk of developing a clot.  After discharge, you should have regular check-up appointments with your healthcare provider that is prescribing your Xarelto.    What do you do if you miss a dose? If you miss a dose, take it as soon as you remember on the same day then continue your regularly scheduled once daily regimen the next day. Do not take two doses of Xarelto on the same day.   Important Safety Information A possible side effect of Xarelto is bleeding. You should call your healthcare provider right away if you experience any of the following: ? Bleeding from an injury or your nose that does not stop. ? Unusual colored urine (red or dark brown) or unusual colored stools (red or black). ? Unusual bruising for unknown reasons. ? A serious fall or if you hit your head (even if there is no bleeding).  Some medicines may interact with Xarelto and might increase your risk of bleeding while on Xarelto. To help avoid this, consult your healthcare provider or pharmacist prior to using any new prescription or non-prescription medications, including herbals, vitamins, non-steroidal anti-inflammatory drugs (NSAIDs) and supplements.  This website has more information on Xarelto: [email or web address]

## 2018-10-18 NOTE — Op Note (Signed)
OPERATIVE REPORT- TOTAL HIP ARTHROPLASTY   PREOPERATIVE DIAGNOSIS: Osteoarthritis of the Right hip.   POSTOPERATIVE DIAGNOSIS: Osteoarthritis of the Right  hip.   PROCEDURE: Right total hip arthroplasty, anterior approach.   SURGEON: Ollen Gross, MD   ASSISTANT: Dennie Bible, PA-C  ANESTHESIA:  Spinal  ESTIMATED BLOOD LOSS:-300 mL    DRAINS: Hemovac x1.   COMPLICATIONS: None   CONDITION: PACU - hemodynamically stable.   BRIEF CLINICAL NOTE: Erika Wolf is a 63 y.o. female who has advanced end-  stage arthritis of their Right  hip with progressively worsening pain and  dysfunction.The patient has failed nonoperative management and presents for  total hip arthroplasty.   PROCEDURE IN DETAIL: After successful administration of spinal  anesthetic, the traction boots for the [facility] bed were placed on both  feet and the patient was placed onto the [facility] bed, boots placed into the leg  holders. The Right hip was then isolated from the perineum with plastic  drapes and prepped and draped in the usual sterile fashion. ASIS and  greater trochanter were marked and a oblique incision was made, starting  at about 1 cm lateral and 2 cm distal to the ASIS and coursing towards  the anterior cortex of the femur. The skin was cut with a 10 blade  through subcutaneous tissue to the level of the fascia overlying the  tensor fascia lata muscle. The fascia was then incised in line with the  incision at the junction of the anterior third and posterior 2/3rd. The  muscle was teased off the fascia and then the interval between the TFL  and the rectus was developed. The Hohmann retractor was then placed at  the top of the femoral neck over the capsule. The vessels overlying the  capsule were cauterized and the fat on top of the capsule was removed.  A Hohmann retractor was then placed anterior underneath the rectus  femoris to give exposure to the entire anterior capsule. A T-shaped   capsulotomy was performed. The edges were tagged and the femoral head  was identified.       Osteophytes are removed off the superior acetabulum.  The femoral neck was then cut in situ with an oscillating saw. Traction  was then applied to the left lower extremity utilizing the [facility]  traction. The femoral head was then removed. Retractors were placed  around the acetabulum and then circumferential removal of the labrum was  performed. Osteophytes were also removed. Reaming starts at 45 mm to  medialize and  Increased in 2 mm increments to 47 mm. We reamed in  approximately 40 degrees of abduction, 20 degrees anteversion. A 48 mm  pinnacle acetabular shell was then impacted in anatomic position under  fluoroscopic guidance with excellent purchase. We did not need to place  any additional dome screws. A 28 mm neural + 4 marathon liner was then  placed into the acetabular shell.       The femoral lift was then placed along the lateral aspect of the femur  just distal to the vastus ridge. The leg was  externally rotated and capsule  was stripped off the inferior aspect of the femoral neck down to the  level of the lesser trochanter, this was done with electrocautery. The femur was lifted after this was performed. The  leg was then placed in an extended and adducted position essentially delivering the femur. We also removed the capsule superiorly and the piriformis from the piriformis fossa  to gain excellent exposure of the  proximal femur. Rongeur was used to remove some cancellous bone to get  into the lateral portion of the proximal femur for placement of the  initial starter reamer. The starter broaches was placed  the starter broach  and was shown to go down the center of the canal. Broaching  with the Actis system was then performed starting at size 0  coursing  Up to size 3. A size 3 had excellent torsional and rotational  and axial stability. The trial high offset neck was then placed   with a 28 + 1.5 trial head. The hip was then reduced. We confirmed that  the stem was in the canal both on AP and lateral x-rays. It also has excellent sizing. The hip was reduced with outstanding stability through full extension and full external rotation.. AP pelvis was taken and the leg lengths were measured and found to be equal. Hip was then dislocated again and the femoral head and neck removed. The  femoral broach was removed. Size 3 Actis stem with a high offset  neck was then impacted into the femur following native anteversion. Has  excellent purchase in the canal. Excellent torsional and rotational and  axial stability. It is confirmed to be in the canal on AP and lateral  fluoroscopic views. The 28 + 1.5 ceramic head was placed and the hip  reduced with outstanding stability. Again AP pelvis was taken and it  confirmed that the leg lengths were equal. The wound was then copiously  irrigated with saline solution and the capsule reattached and repaired  with Ethibond suture. 30 ml of .25% Bupivicaine was  injected into the capsule and into the edge of the tensor fascia lata as well as subcutaneous tissue. The fascia overlying the tensor fascia lata was then closed with a running #1 V-Loc. Subcu was closed with interrupted 2-0 Vicryl and subcuticular running 4-0 Monocryl. Incision was cleaned  and dried. Steri-Strips and a bulky sterile dressing applied. Hemovac  drain was hooked to suction and then the patient was awakened and transported to  recovery in stable condition.        Please note that a surgical assistant was a medical necessity for this procedure to perform it in a safe and expeditious manner. Assistant was necessary to provide appropriate retraction of vital neurovascular structures and to prevent femoral fracture and allow for anatomic placement of the prosthesis.  Gaynelle Arabian, M.D.

## 2018-10-18 NOTE — Evaluation (Signed)
Physical Therapy Evaluation Patient Details Name: Erika Wolf MRN: [id number] DOB: 12-03-1955 Today's Date: 10/18/2018   History of Present Illness  63 yo female s/p R DA-THA on 10/18/18. PMH includes HTN, obesity, RTC repair.   Clinical Impression  Pt presents with R hip pain, decreased R hip strength, difficulty performing bed mobility, increased time and effort to perform mobility tasks, and decreased activity tolerance. Pt to benefit from acute PT to address deficits. Pt ambulated short hallway distance with RW with min guard assist, verbal cuing for form and safety provided throughout. After 40 ft ambulation, pt reported feeling hot and sat in recliner immediately, BP/HR WNL. Pt educated on ankle pumps (20/hour) to perform this afternoon/evening to increase circulation, to pt's tolerance and limited by pain. PT to progress mobility as tolerated, and will continue to follow acutely.        Follow Up Recommendations Supervision for mobility/OOB;Follow surgeon's recommendation for DC plan and follow-up therapies(HEP)    Equipment Recommendations  3in1 (PT)    Recommendations for Other Services       Precautions / Restrictions Precautions Precautions: Fall Restrictions Weight Bearing Restrictions: No Other Position/Activity Restrictions: WBAT      Mobility  Bed Mobility Overal bed mobility: Needs Assistance Bed Mobility: Supine to Sit     Supine to sit: Min assist;HOB elevated     General bed mobility comments: Min assist for RLE lifting and translation to EOB, trunk elevation, scooting to EOB with use of bed pad.   Transfers Overall transfer level: Needs assistance Equipment used: Rolling walker (2 wheeled) Transfers: Sit to/from Stand Sit to Stand: Min guard;From elevated surface         General transfer comment: Min guard for safety, verbal cuing for hand placement when rising.   Ambulation/Gait Ambulation/Gait assistance: Min guard Gait Distance (Feet): 40  Feet Assistive device: Rolling walker (2 wheeled) Gait Pattern/deviations: Step-to pattern;Decreased stride length;Antalgic;Trunk flexed Gait velocity: decr    General Gait Details: Min guard for safety. Verbal cuing for upright posture, placement in RW, sequencing. Pt with increasingly antalgic gait, complaints of being "hot". PT aide brought recliner and pt reclined immedaitely, BP and HR WNL.   Stairs            Wheelchair Mobility    Modified Rankin (Stroke Patients Only)       Balance Overall balance assessment: Mild deficits observed, not formally tested                                           Pertinent Vitals/Pain Pain Assessment: 0-10 Pain Score: 7  Pain Location: R hip  Pain Descriptors / Indicators: Aching;Discomfort;Burning Pain Intervention(s): Monitored during session;Repositioned;Limited activity within patient's tolerance;Patient requesting pain meds-RN notified    Home Living Family/patient expects to be discharged to:: Private residence Living Arrangements: Spouse/significant other Available Help at Discharge: Family;Available 24 hours/day Type of Home: House Home Access: Stairs to enter Entrance Stairs-Rails: Right Entrance Stairs-Number of Steps: 4 Home Layout: One level Home Equipment: Cane - single point;Walker - 2 wheels Additional Comments: needs BSC    Prior Function Level of Independence: Independent with assistive device(s)         Comments: used cane PTA      Hand Dominance   Dominant Hand: Right    Extremity/Trunk Assessment   Upper Extremity Assessment Upper Extremity Assessment: Overall WFL for tasks assessed  Lower Extremity Assessment Lower Extremity Assessment: Overall WFL for tasks assessed;RLE deficits/detail RLE Deficits / Details: suspected R hip surgical weakness; able to perform ankle pumps, quad set, heel slide to 25* limited by pain  RLE Sensation: WNL    Cervical / Trunk  Assessment Cervical / Trunk Assessment: Normal  Communication   Communication: No difficulties  Cognition Arousal/Alertness: Awake/alert Behavior During Therapy: WFL for tasks assessed/performed Overall Cognitive Status: Within Functional Limits for tasks assessed                                        General Comments      Exercises     Assessment/Plan    PT Assessment Patient needs continued PT services  PT Problem List Decreased strength;Decreased mobility;Decreased safety awareness;Decreased range of motion;Decreased activity tolerance;Decreased balance;Pain;Decreased knowledge of use of DME       PT Treatment Interventions DME instruction;Functional mobility training;Balance training;Patient/family education;Gait training;Therapeutic activities;Stair training;Therapeutic exercise    PT Goals (Current goals can be found in the Care Plan section)  Acute Rehab PT Goals Patient Stated Goal: go home to husband PT Goal Formulation: With patient Time For Goal Achievement: 10/25/18 Potential to Achieve Goals: Good    Frequency 7X/week   Barriers to discharge        Co-evaluation               AM-PAC PT "6 Clicks" Mobility  Outcome Measure Help needed turning from your back to your side while in a flat bed without using bedrails?: A Little Help needed moving from lying on your back to sitting on the side of a flat bed without using bedrails?: A Little Help needed moving to and from a bed to a chair (including a wheelchair)?: A Little Help needed standing up from a chair using your arms (e.g., wheelchair or bedside chair)?: A Little Help needed to walk in hospital room?: A Little Help needed climbing 3-5 steps with a railing? : A Lot 6 Click Score: 17    End of Session Equipment Utilized During Treatment: Gait belt Activity Tolerance: Patient tolerated treatment well;Patient limited by pain Patient left: in chair;with call bell/phone within  reach;with SCD's reapplied Nurse Communication: Mobility status PT Visit Diagnosis: Other abnormalities of gait and mobility (R26.89);Difficulty in walking, not elsewhere classified (R26.2)    Time: [id number] PT Time Calculation (min) (ACUTE ONLY): 17 min   Charges:   PT Evaluation $PT Eval Low Complexity: 1 Low         Nicola PoliceAlexa D Yamari Wolf, PT Acute Rehabilitation Services Pager [phone]  Office [phone]  Tyrone AppleAlexa D Despina Hiddenure 10/18/2018, 5:27 PM

## 2018-10-19 ENCOUNTER — Encounter (HOSPITAL_COMMUNITY): Payer: Self-pay | Admitting: Orthopedic Surgery

## 2018-10-19 LAB — CBC
HCT: 35.5 % — ABNORMAL LOW (ref 36.0–46.0)
Hemoglobin: 11.4 g/dL — ABNORMAL LOW (ref 12.0–15.0)
MCH: 29.4 pg (ref 26.0–34.0)
MCHC: 32.1 g/dL (ref 30.0–36.0)
MCV: 91.5 fL (ref 80.0–100.0)
Platelets: 306 10*3/uL (ref 150–400)
RBC: 3.88 MIL/uL (ref 3.87–5.11)
RDW: 14.4 % (ref 11.5–15.5)
WBC: 17.2 10*3/uL — ABNORMAL HIGH (ref 4.0–10.5)
nRBC: 0 % (ref 0.0–0.2)

## 2018-10-19 LAB — BASIC METABOLIC PANEL
Anion gap: 8 (ref 5–15)
BUN: 17 mg/dL (ref 8–23)
CO2: 21 mmol/L — ABNORMAL LOW (ref 22–32)
Calcium: 7.3 mg/dL — ABNORMAL LOW (ref 8.9–10.3)
Chloride: 104 mmol/L (ref 98–111)
Creatinine, Ser: 0.55 mg/dL (ref 0.44–1.00)
GFR calc Af Amer: >60 mL/min (ref >60)
GFR calc non Af Amer: >60 mL/min (ref >60)
Glucose, Bld: 114 mg/dL — ABNORMAL HIGH (ref 70–99)
Potassium: 3.3 mmol/L — ABNORMAL LOW (ref 3.5–5.1)
Sodium: 133 mmol/L — ABNORMAL LOW (ref 135–145)

## 2018-10-19 MED ORDER — TRAMADOL HCL 50 MG PO TABS: 50.0000 mg | ORAL_TABLET | Freq: Four times a day (QID) | ORAL | 0 refills | Status: DC | PRN

## 2018-10-19 MED ORDER — RIVAROXABAN 10 MG PO TABS
10.0000 mg | ORAL_TABLET | Freq: Every day | ORAL | Status: DC
  Administered 2018-10-19: 10 mg via ORAL
  Filled 2018-10-19: qty 1

## 2018-10-19 MED ORDER — HYDROCODONE-ACETAMINOPHEN 5-325 MG PO TABS: 1.0000 | ORAL_TABLET | Freq: Four times a day (QID) | ORAL | 0 refills | Status: DC | PRN

## 2018-10-19 MED ORDER — POTASSIUM CHLORIDE CRYS ER 20 MEQ PO TBCR
40.0000 meq | EXTENDED_RELEASE_TABLET | Freq: Once | ORAL | Status: AC
  Administered 2018-10-19: 40 meq via ORAL
  Filled 2018-10-19: qty 2

## 2018-10-19 MED ORDER — RIVAROXABAN 10 MG PO TABS: 10.0000 mg | ORAL_TABLET | Freq: Every day | ORAL | 0 refills | Status: DC

## 2018-10-19 MED ORDER — METHOCARBAMOL 500 MG PO TABS: 500.0000 mg | ORAL_TABLET | Freq: Four times a day (QID) | ORAL | 0 refills | Status: DC | PRN

## 2018-10-19 NOTE — Progress Notes (Signed)
Physical Therapy Treatment Patient Details Name: Erika Wolf MRN: [id number] DOB: 07-07-1955 Today's Date: 10/19/2018    History of Present Illness 63 yo female s/p R DA-THA on 10/18/18. PMH includes HTN, obesity, RTC repair.     PT Comments    Pt ambulated in hallway and performed LE exercises.  Pt anticipates d/c home after second session.  Will return to practice steps.   Follow Up Recommendations  Supervision for mobility/OOB;Follow surgeon's recommendation for DC plan and follow-up therapies(HEP)     Equipment Recommendations  3in1 (PT)    Recommendations for Other Services       Precautions / Restrictions Precautions Precautions: Fall Restrictions Other Position/Activity Restrictions: WBAT    Mobility  Bed Mobility               General bed mobility comments: pt up in recliner on arrival  Transfers Overall transfer level: Needs assistance Equipment used: Rolling walker (2 wheeled) Transfers: Sit to/from Stand Sit to Stand: Min guard         General transfer comment: Min guard for safety, verbal cuing for hand placement   Ambulation/Gait Ambulation/Gait assistance: Min guard Gait Distance (Feet): 80 Feet Assistive device: Rolling walker (2 wheeled) Gait Pattern/deviations: Step-to pattern;Decreased stride length;Antalgic;Trunk flexed     General Gait Details: verbal cues for sequence, RW positioning, step length and heel strike   Stairs             Wheelchair Mobility    Modified Rankin (Stroke Patients Only)       Balance                                            Cognition Arousal/Alertness: Awake/alert Behavior During Therapy: WFL for tasks assessed/performed Overall Cognitive Status: Within Functional Limits for tasks assessed                                        Exercises Total Joint Exercises Ankle Circles/Pumps: AROM;10 reps;Both Quad Sets: AROM;10 reps;Right Hip  ABduction/ADduction: AROM;10 reps;Right;Standing;Supine Long Arc Quad: AROM;10 reps;Seated;Right Knee Flexion: AROM;10 reps;Standing;Right Marching in Standing: AROM;10 reps;Standing;Right Standing Hip Extension: AROM;10 reps;Standing;Right    General Comments        Pertinent Vitals/Pain Pain Assessment: 0-10 Pain Score: 6  Pain Descriptors / Indicators: Aching;Discomfort;Burning Pain Intervention(s): Premedicated before session;Monitored during session;Repositioned;Ice applied    Home Living                      Prior Function            PT Goals (current goals can now be found in the care plan section) Progress towards PT goals: Progressing toward goals    Frequency    7X/week      PT Plan Current plan remains appropriate    Co-evaluation              AM-PAC PT "6 Clicks" Mobility   Outcome Measure  Help needed turning from your back to your side while in a flat bed without using bedrails?: A Little Help needed moving from lying on your back to sitting on the side of a flat bed without using bedrails?: A Little Help needed moving to and from a bed to a chair (including a wheelchair)?: A Little Help needed  standing up from a chair using your arms (e.g., wheelchair or bedside chair)?: A Little Help needed to walk in hospital room?: A Little Help needed climbing 3-5 steps with a railing? : A Little 6 Click Score: 18    End of Session Equipment Utilized During Treatment: Gait belt Activity Tolerance: Patient tolerated treatment well Patient left: in chair;with call bell/phone within reach   PT Visit Diagnosis: Other abnormalities of gait and mobility (R26.89);Difficulty in walking, not elsewhere classified (R26.2)     Time: [id number] PT Time Calculation (min) (ACUTE ONLY): 20 min  Charges:  $Therapeutic Exercise: 8-22 mins                     Zenovia JarredKati Deandrea Rion, PT, DPT Acute Rehabilitation Services Office: [phone] Pager:  [phone]  Sarajane JewsLEMYRE,KATHrine E 10/19/2018, 3:43 PM

## 2018-10-19 NOTE — Progress Notes (Signed)
Physical Therapy Treatment Patient Details Name: Erika Wolf MRN: [id number] DOB: 07/14/1955 Today's Date: 10/19/2018    History of Present Illness 63 yo female s/p R DA-THA on 10/18/18. PMH includes HTN, obesity, RTC repair.     PT Comments    Pt ambulated in hallway and practiced safe stair technique.  Pt provided with HEP handout and verbally reviewed exercises.  Pt reports understanding and had no further questions.  Pt feels ready for d/c home today.   Follow Up Recommendations  Supervision for mobility/OOB;Follow surgeon's recommendation for DC plan and follow-up therapies(HEP)     Equipment Recommendations  3in1 (PT)    Recommendations for Other Services       Precautions / Restrictions Precautions Precautions: Fall Restrictions Other Position/Activity Restrictions: WBAT    Mobility  Bed Mobility               General bed mobility comments: pt up in recliner on arrival  Transfers Overall transfer level: Needs assistance Equipment used: Rolling walker (2 wheeled) Transfers: Sit to/from Stand Sit to Stand: Supervision         General transfer comment: supervision for safety  Ambulation/Gait Ambulation/Gait assistance: Min guard;Supervision Gait Distance (Feet): 180 Feet Assistive device: Rolling walker (2 wheeled) Gait Pattern/deviations: Step-to pattern;Decreased stride length;Antalgic;Trunk flexed;Step-through pattern     General Gait Details: verbal cues for sequence, RW positioning, step length and heel strike   Stairs Stairs: Yes Stairs assistance: Min guard Stair Management: Step to pattern;One rail Right;Forwards Number of Stairs: 3 General stair comments: verbal cues for sequence, safety; pt reports understanding and did not feel need to practice second time   Wheelchair Mobility    Modified Rankin (Stroke Patients Only)       Balance                                            Cognition Arousal/Alertness:  Awake/alert Behavior During Therapy: WFL for tasks assessed/performed Overall Cognitive Status: Within Functional Limits for tasks assessed                                        Exercises    General Comments        Pertinent Vitals/Pain Pain Assessment: 0-10 Pain Score: 4  Pain Location: R hip  Pain Descriptors / Indicators: Aching;Discomfort;Burning Pain Intervention(s): Monitored during session;Repositioned    Home Living                      Prior Function            PT Goals (current goals can now be found in the care plan section) Progress towards PT goals: Progressing toward goals    Frequency    7X/week      PT Plan Current plan remains appropriate    Co-evaluation              AM-PAC PT "6 Clicks" Mobility   Outcome Measure  Help needed turning from your back to your side while in a flat bed without using bedrails?: A Little Help needed moving from lying on your back to sitting on the side of a flat bed without using bedrails?: A Little Help needed moving to and from a bed to a chair (including a wheelchair)?: A Little  Help needed standing up from a chair using your arms (e.g., wheelchair or bedside chair)?: A Little Help needed to walk in hospital room?: A Little Help needed climbing 3-5 steps with a railing? : A Little 6 Click Score: 18    End of Session Equipment Utilized During Treatment: Gait belt Activity Tolerance: Patient tolerated treatment well Patient left: in chair;with call bell/phone within reach   PT Visit Diagnosis: Other abnormalities of gait and mobility (R26.89);Difficulty in walking, not elsewhere classified (R26.2)     Time: 5520-8022 PT Time Calculation (min) (ACUTE ONLY): 9 min  Charges:  $Gait Training: 8-22 mins                      Erika Wolf, PT, DPT Acute Rehabilitation Services Office: [phone] Pager: [phone]  Erika Wolf 10/19/2018, 3:47 PM

## 2018-10-19 NOTE — Progress Notes (Signed)
Patient discharged to home w/ family. Given all belongings, instructions, equipment. All questions answered. Escorted to pov via w/c. 

## 2018-10-19 NOTE — Progress Notes (Signed)
   Subjective: 1 Day Post-Op Procedure(s) (LRB): TOTAL HIP ARTHROPLASTY ANTERIOR APPROACH (Right) Patient reports pain as mild.   Patient seen in rounds by Dr. Lequita Halt. Patient is well, and has had no acute complaints or problems other than discomfort in the right hip. Denies chest pain or SOB. Foley catheter removed this AM. No issues overnight.  We will continue therapy today.   Objective: Vital signs in last 24 hours: Temp:  [97.5 F (36.4 C)-98.3 F (36.8 C)] 97.9 F (36.6 C) (06/04 0616) Pulse Rate:  [69-88] 82 (06/04 0616) Resp:  [12-18] 18 (06/04 0616) BP: (97-156)/(73-88) 155/88 (06/04 0616) SpO2:  [95 %-100 %] 96 % (06/04 0616)  Intake/Output from previous day:  Intake/Output Summary (Last 24 hours) at 10/19/2018 0710 Last data filed at 10/19/2018 0630 Gross per 24 hour  Intake 4696.02 ml  Output 2065 ml  Net 2631.02 ml    Labs: Recent Labs    10/19/18 0420  HGB 11.4*   Recent Labs    10/19/18 0420  WBC 17.2*  RBC 3.88  HCT 35.5*  PLT 306   Recent Labs    10/19/18 0420  NA 133*  K 3.3*  CL 104  CO2 21*  BUN 17  CREATININE 0.55  GLUCOSE 114*  CALCIUM 7.3*   Exam: General - Patient is Alert and Oriented Extremity - Neurologically intact Neurovascular intact Sensation intact distally Dorsiflexion/Plantar flexion intact Dressing - dressing C/D/I Motor Function - intact, moving foot and toes well on exam.   Past Medical History:  Diagnosis Date  . HTN (hypertension)   . Urinary incontinence    cough, sneeze    Assessment/Plan: 1 Day Post-Op Procedure(s) (LRB): TOTAL HIP ARTHROPLASTY ANTERIOR APPROACH (Right) Active Problems:   OA (osteoarthritis) of hip  Estimated body mass index is 36.12 kg/m as calculated from the following:   Height as of this encounter: 5' 1.5" (1.562 m).   Weight as of this encounter: 88.1 kg. Advance diet Up with therapy D/C IV fluids  DVT Prophylaxis - Xarelto Weight bearing as tolerated. D/C O2 and pulse  ox and try on room air. Hemovac pulled without difficulty, will continue therapy.  Potassium 3.3 this AM, one dose of 40 mEq KCl ordered.  Plan is to go Home after hospital stay. Plan for discharge with HEP later today once meeting goals with physical therapy. Follow-up in the office in 2 weeks.   Arther Abbott, PA-C Orthopedic Surgery 10/19/2018, 7:10 AM

## 2018-10-23 NOTE — Discharge Summary (Signed)
Physician Discharge Summary   Patient ID: Erika Wolf MRN: [id number] DOB/AGE: Jun 11, 1955 63 y.o.  Admit date: 10/18/2018 Discharge date: 10/19/2018  Primary Diagnosis: Osteoarthritis, right hip   Admission Diagnoses:  Past Medical History:  Diagnosis Date  . HTN (hypertension)   . Urinary incontinence    cough, sneeze   Discharge Diagnoses:   Active Problems:   OA (osteoarthritis) of hip  Estimated body mass index is 36.12 kg/m as calculated from the following:   Height as of this encounter: 5' 1.5" (1.562 m).   Weight as of this encounter: 88.1 kg.  Procedure:  Procedure(s) (LRB): TOTAL HIP ARTHROPLASTY ANTERIOR APPROACH (Right)   Consults: None  HPI: Erika Wolf is a 63 y.o. female who has advanced end-stage arthritis of their Right  hip with progressively worsening pain and dysfunction.The patient has failed nonoperative management and presents for total hip arthroplasty.   Laboratory Data: Admission on 10/18/2018, Discharged on 10/19/2018  Component Date Value Ref Range Status  . WBC 10/19/2018 17.2* 4.0 - 10.5 K/uL Final  . RBC 10/19/2018 3.88  3.87 - 5.11 MIL/uL Final  . Hemoglobin 10/19/2018 11.4* 12.0 - 15.0 g/dL Final  . HCT 10/19/2018 35.5* 36.0 - 46.0 % Final  . MCV 10/19/2018 91.5  80.0 - 100.0 fL Final  . MCH 10/19/2018 29.4  26.0 - 34.0 pg Final  . MCHC 10/19/2018 32.1  30.0 - 36.0 g/dL Final  . RDW 10/19/2018 14.4  11.5 - 15.5 % Final  . Platelets 10/19/2018 306  150 - 400 K/uL Final  . nRBC 10/19/2018 0.0  0.0 - 0.2 % Final   Performed at [facility], [place] [address] [place], [place] [address]  . Sodium 10/19/2018 133* 135 - 145 mmol/L Final  . Potassium 10/19/2018 3.3* 3.5 - 5.1 mmol/L Final  . Chloride 10/19/2018 104  98 - 111 mmol/L Final  . CO2 10/19/2018 21* 22 - 32 mmol/L Final  . Glucose, Bld 10/19/2018 114* 70 - 99 mg/dL Final  . BUN 10/19/2018 17  8 - 23 mg/dL Final  . Creatinine, Ser 10/19/2018 0.55  0.44 - 1.00  mg/dL Final  . Calcium 10/19/2018 7.3* 8.9 - 10.3 mg/dL Final  . GFR calc non Af Amer 10/19/2018 >60  >60 mL/min Final  . GFR calc Af Amer 10/19/2018 >60  >60 mL/min Final  . Anion gap 10/19/2018 8  5 - 15 Final   Performed at [facility], [place] [address], [place], [place] [address]  Hospital Outpatient Visit on 10/16/2018  Component Date Value Ref Range Status  . SARS Coronavirus 2 10/16/2018 NEGATIVE  NEGATIVE Final   Comment: (NOTE) If result is NEGATIVE SARS-CoV-2 target nucleic acids are NOT DETECTED. The SARS-CoV-2 RNA is generally detectable in upper and lower  respiratory specimens during the acute phase of infection. The lowest  concentration of SARS-CoV-2 viral copies this assay can detect is 250  copies / mL. A negative result does not preclude SARS-CoV-2 infection  and should not be used as the sole basis for treatment or other  patient management decisions.  A negative result may occur with  improper specimen collection / handling, submission of specimen other  than nasopharyngeal swab, presence of viral mutation(s) within the  areas targeted by this assay, and inadequate number of viral copies  (<250 copies / mL). A negative result must be combined with clinical  observations, patient history, and epidemiological information. If result is POSITIVE SARS-CoV-2 target nucleic acids are DETECTED. The SARS-CoV-2 RNA  is generally detectable in upper and lower  respiratory specimens dur                          ing the acute phase of infection.  Positive  results are indicative of active infection with SARS-CoV-2.  Clinical  correlation with patient history and other diagnostic information is  necessary to determine patient infection status.  Positive results do  not rule out bacterial infection or co-infection with other viruses. If result is PRESUMPTIVE POSTIVE SARS-CoV-2 nucleic acids MAY BE PRESENT.   A presumptive positive result was obtained on the  submitted specimen  and confirmed on repeat testing.  While 2019 novel coronavirus  (SARS-CoV-2) nucleic acids may be present in the submitted sample  additional confirmatory testing may be necessary for epidemiological  and / or clinical management purposes  to differentiate between  SARS-CoV-2 and other Sarbecovirus currently known to infect humans.  If clinically indicated additional testing with an alternate test  methodology [phone]) is advised. The SARS-CoV-2 RNA is generally  detectable in upper and lower respiratory sp                          ecimens during the acute  phase of infection. The expected result is Negative. Fact Sheet for Patients:  BoilerBrush.com.cy Fact Sheet for Healthcare Providers: https://pope.com/ This test is not yet approved or cleared by the [place] FDA and has been authorized for detection and/or diagnosis of SARS-CoV-2 by FDA under an Emergency Use Authorization (EUA).  This EUA will remain in effect (meaning this test can be used) for the duration of the COVID-19 declaration under Section 564(b)(1) of the Act, 21 U.S.C. section 360bbb-3(b)(1), unless the authorization is terminated or revoked sooner. Performed at [facility], [address] [place], [place] [address]   Hospital Outpatient Visit on 10/12/2018  Component Date Value Ref Range Status  . aPTT 10/12/2018 33  24 - 36 seconds Final   Performed at [facility], [address] [place], [place] [address]  . WBC 10/12/2018 8.4  4.0 - 10.5 K/uL Final  . RBC 10/12/2018 5.02  3.87 - 5.11 MIL/uL Final  . Hemoglobin 10/12/2018 14.6  12.0 - 15.0 g/dL Final  . HCT 56/38/7564 45.0  36.0 - 46.0 % Final  . MCV 10/12/2018 89.6  80.0 - 100.0 fL Final  . MCH 10/12/2018 29.1  26.0 - 34.0 pg Final  . MCHC 10/12/2018 32.4  30.0 - 36.0 g/dL Final  . RDW 33/29/5188 14.6  11.5 - 15.5 % Final  . Platelets 10/12/2018  353  150 - 400 K/uL Final  . nRBC 10/12/2018 0.0  0.0 - 0.2 % Final   Performed at [facility], [address], [place], [place] [address]  . Sodium 10/12/2018 137  135 - 145 mmol/L Final  . Potassium 10/12/2018 3.4* 3.5 - 5.1 mmol/L Final  . Chloride 10/12/2018 100  98 - 111 mmol/L Final  . CO2 10/12/2018 26  22 - 32 mmol/L Final  . Glucose, Bld 10/12/2018 140* 70 - 99 mg/dL Final  . BUN 63/05/6008 23  8 - 23 mg/dL Final  . Creatinine, Ser 10/12/2018 0.82  0.44 - 1.00 mg/dL Final  . Calcium 93/23/5573 9.5  8.9 - 10.3 mg/dL Final  . Total Protein 10/12/2018 8.0  6.5 - 8.1 g/dL Final  . Albumin 22/06/5425 4.2  3.5 - 5.0 g/dL Final  .  AST 10/12/2018 21  15 - 41 U/L Final  . ALT 10/12/2018 22  0 - 44 U/L Final  . Alkaline Phosphatase 10/12/2018 66  38 - 126 U/L Final  . Total Bilirubin 10/12/2018 0.6  0.3 - 1.2 mg/dL Final  . GFR calc non Af Amer 10/12/2018 >60  >60 mL/min Final  . GFR calc Af Amer 10/12/2018 >60  >60 mL/min Final  . Anion gap 10/12/2018 11  5 - 15 Final   Performed at [facility], [address], [place], [place] [address]  . Prothrombin Time 10/12/2018 13.2  11.4 - 15.2 seconds Final  . INR 10/12/2018 1.0  0.8 - 1.2 Final   Comment: (NOTE) INR goal varies based on device and disease states. Performed at [facility], [address], [place], [place] [address]   . ABO/RH(D) 10/12/2018 A POS   Final  . Antibody Screen 10/12/2018 NEG   Final  . Sample Expiration 10/12/2018 10/21/2018,2359   Final  . Extend sample reason 10/12/2018    Final                   Value:NO TRANSFUSIONS OR PREGNANCY IN THE PAST 3 MONTHS Performed at [facility], [address] [place], [place] [address]   . MRSA, PCR 10/12/2018 NEGATIVE  NEGATIVE Final  . Staphylococcus aureus 10/12/2018 NEGATIVE  NEGATIVE Final   Comment: (NOTE) The Xpert SA Assay (FDA approved for NASAL specimens in patients 50 years of age  and older), is one component of a comprehensive surveillance program. It is not intended to diagnose infection nor to guide or monitor treatment. Performed at [facility], [address] [place], [place] [address]   . ABO/RH(D) 10/12/2018    Final                   Value:A POS Performed at [facility], [address], [place], [place] [address]   Office Visit on 10/06/2018  Component Date Value Ref Range Status  . Hemoglobin A1C 10/06/2018 5.5  4.0 - 5.6 % Final  . Color, UA 10/06/2018 yellow  yellow Final  . Clarity, UA 10/06/2018 clear  clear Final  . Glucose, UA 10/06/2018 negative  negative mg/dL Final  . Bilirubin, UA 10/06/2018 negative  negative Final  . Ketones, POC UA 10/06/2018 negative  negative mg/dL Final  . Spec Grav, UA 10/06/2018 1.015  1.010 - 1.025 Final  . Blood, UA 10/06/2018 negative  negative Final  . pH, UA 10/06/2018 7.0  5.0 - 8.0 Final  . Protein Ur, POC 10/06/2018 negative  negative mg/dL Final  . Urobilinogen, UA 10/06/2018 0.2  0.2 or 1.0 E.U./dL Final  . Nitrite, UA 60/63/0160 Negative  Negative Final  . Leukocytes, UA 10/06/2018 Negative  Negative Final  Appointment on 09/25/2018  Component Date Value Ref Range Status  . Glucose 09/25/2018 90  65 - 99 mg/dL Final  . BUN 10/93/2355 21  8 - 27 mg/dL Final  . Creatinine, Ser 09/25/2018 0.79  0.57 - 1.00 mg/dL Final  . GFR calc non Af Amer 09/25/2018 80  >59 mL/min/1.73 Final  . GFR calc Af Amer 09/25/2018 93  >59 mL/min/1.73 Final  . BUN/Creatinine Ratio 09/25/2018 27  12 - 28 Final  . Sodium 09/25/2018 140  134 - 144 mmol/L Final  . Potassium 09/25/2018 4.4  3.5 - 5.2 mmol/L Final  . Chloride 09/25/2018 99  96 - 106 mmol/L Final  .  CO2 09/25/2018 24  20 - 29 mmol/L Final  . Calcium 09/25/2018 10.1  8.7 - 10.3 mg/dL Final     X-Rays:Dg Chest 2 View  Result Date: 10/11/2018 CLINICAL DATA:  Preop hip replacement EXAM: CHEST - 2 VIEW COMPARISON:  03/11/2018  FINDINGS: The heart size and mediastinal contours are within normal limits. Both lungs are clear. The visualized skeletal structures are unremarkable. IMPRESSION: No active cardiopulmonary disease. Electronically Signed   By: Charlett NoseKevin  Dover M.D.   On: 10/11/2018 11:58   Dg Pelvis Portable  Result Date: 10/18/2018 CLINICAL DATA:  Status post right hip replacement EXAM: PORTABLE PELVIS 1-2 VIEWS COMPARISON:  10/18/2018 FINDINGS: Right hip replacement is noted in satisfactory position. Surgical drain is noted in place. No bony abnormality is noted. IMPRESSION: No acute abnormality noted. Electronically Signed   By: Alcide CleverMark  Lukens M.D.   On: 10/18/2018 11:04   Dg C-arm 1-60 Min-no Report  Result Date: 10/18/2018 Fluoroscopy was utilized by the requesting physician.  No radiographic interpretation.   Dg Hip Operative Unilat With Pelvis Right  Result Date: 10/18/2018 CLINICAL DATA:  Status post right total hip replacement. EXAM: OPERATIVE right HIP (WITH PELVIS IF PERFORMED) 2 VIEWS TECHNIQUE: Fluoroscopic spot image(s) were submitted for interpretation post-operatively. Radiation exposure index: 1.3004 mGy. COMPARISON:  None. FINDINGS: Two intraoperative fluoroscopic images of the right hip demonstrate the femoral and acetabular components to be well situated. Expected postoperative changes are seen in the surrounding soft tissues. IMPRESSION: Fluoroscopic guidance provided during right total hip arthroplasty. Electronically Signed   By: Lupita RaiderJames  Green Jr M.D.   On: 10/18/2018 10:16    EKG: Orders placed or performed in visit on 10/06/18  . EKG 12-Lead     Hospital Course: Erika Wolf is a 63 y.o. who was [facility] to [facility]. They were brought to the operating room on 10/18/2018 and underwent Procedure(s): TOTAL HIP ARTHROPLASTY ANTERIOR APPROACH.  Patient tolerated the procedure well and was later transferred to the recovery room and then to the orthopaedic floor for postoperative care. They  were given PO and IV analgesics for pain control following their surgery. They were given 24 hours of postoperative antibiotics of  Anti-infectives (From admission, onward)   Start     Dose/Rate Route Frequency Ordered Stop   10/18/18 1430  ceFAZolin (ANCEF) IVPB 2g/100 mL premix     2 g 200 mL/hr over 30 Minutes Intravenous Every 6 hours 10/18/18 1116 10/18/18 2056   10/18/18 0645  ceFAZolin (ANCEF) IVPB 2g/100 mL premix     2 g 200 mL/hr over 30 Minutes Intravenous On call to O.R. 10/18/18 0636 10/18/18 0830     and started on DVT prophylaxis in the form of Xarelto.   PT and OT were ordered for total joint protocol. Discharge planning consulted to help with postop disposition and equipment needs.  Patient had a good night on the evening of surgery. They started to get up OOB with therapy on POD #0. Pt was seen during rounds and was ready to go home pending progress with therapy. Hemovac drain was pulled without difficulty. Potassium was noted to be slightly low at 3.3, one dose of 40 mEq KCl ordered. She worked with therapy on POD #1 and was meeting her goals. Pt was discharged to home later that day in stable condition.  Diet: Regular diet Activity: WBAT Follow-up: in 2 weeks Disposition: Home with HEP Discharged Condition: stable   Discharge Instructions    Call MD / Call 911  Complete by:  As directed    If you experience chest pain or shortness of breath, CALL 911 and be transported to the hospital emergency room.  If you develope a fever above 101 F, pus (white drainage) or increased drainage or redness at the wound, or calf pain, call your surgeon's office.   Change dressing   Complete by:  As directed    You may change your dressing on Friday, then change the dressing daily with sterile 4 x 4 inch gauze dressing and paper tape.   Constipation Prevention   Complete by:  As directed    Drink plenty of fluids.  Prune juice may be helpful.  You may use a stool softener, such as  Colace (over the counter) 100 mg twice a day.  Use MiraLax (over the counter) for constipation as needed.   Diet - low sodium heart healthy   Complete by:  As directed    Discharge instructions   Complete by:  As directed    Dr. Ollen Gross Total Joint Specialist [facility] [address], [address] [place], [place] [address] [phone]  ANTERIOR APPROACH TOTAL HIP REPLACEMENT POSTOPERATIVE DIRECTIONS   Hip Rehabilitation, Guidelines Following Surgery  The results of a hip operation are greatly improved after range of motion and muscle strengthening exercises. Follow all safety measures which are given to protect your hip. If any of these exercises cause increased pain or swelling in your joint, decrease the amount until you are comfortable again. Then slowly increase the exercises. Call your caregiver if you have problems or questions.   HOME CARE INSTRUCTIONS  Remove items at home which could result in a fall. This includes throw rugs or furniture in walking pathways.  ICE to the affected hip every three hours for 30 minutes at a time and then as needed for pain and swelling.  Continue to use ice on the hip for pain and swelling from surgery. You may notice swelling that will progress down to the foot and ankle.  This is normal after surgery.  Elevate the leg when you are not up walking on it.   Continue to use the breathing machine which will help keep your temperature down.  It is common for your temperature to cycle up and down following surgery, especially at night when you are not up moving around and exerting yourself.  The breathing machine keeps your lungs expanded and your temperature down.  DIET You may resume your previous home diet once your are discharged from the hospital.  DRESSING / WOUND CARE / SHOWERING You may change your dressing 3-5 days after surgery.  Then change the dressing every day with sterile gauze.  Please use good hand washing techniques before  changing the dressing.  Do not use any lotions or creams on the incision until instructed by your surgeon. You may start showering once you are discharged home but do not submerge the incision under water. Just pat the incision dry and apply a dry gauze dressing on daily. Change the surgical dressing daily and reapply a dry dressing each time.  ACTIVITY Walk with your walker as instructed. Use walker as long as suggested by your caregivers. Avoid periods of inactivity such as sitting longer than an hour when not asleep. This helps prevent blood clots.  You may resume a sexual relationship in one month or when given the OK by your doctor.  You may return to work once you are cleared by your doctor.  Do not  drive a car for 6 weeks or until released by you surgeon.  Do not drive while taking narcotics.  WEIGHT BEARING Weight bearing as tolerated with assist device (walker, cane, etc) as directed, use it as long as suggested by your surgeon or therapist, typically at least 4-6 weeks.  POSTOPERATIVE CONSTIPATION PROTOCOL Constipation - defined medically as fewer than three stools per week and severe constipation as less than one stool per week.  One of the most common issues patients have following surgery is constipation.  Even if you have a regular bowel pattern at home, your normal regimen is likely to be disrupted due to multiple reasons following surgery.  Combination of anesthesia, postoperative narcotics, change in appetite and fluid intake all can affect your bowels.  In order to avoid complications following surgery, here are some recommendations in order to help you during your recovery period.  Colace (docusate) - Pick up an over-the-counter form of Colace or another stool softener and take twice a day as long as you are requiring postoperative pain medications.  Take with a full glass of water daily.  If you experience loose stools or diarrhea, hold the colace until you stool forms back  up.  If your symptoms do not get better within 1 week or if they get worse, check with your doctor.  Dulcolax (bisacodyl) - Pick up over-the-counter and take as directed by the product packaging as needed to assist with the movement of your bowels.  Take with a full glass of water.  Use this product as needed if not relieved by Colace only.   MiraLax (polyethylene glycol) - Pick up over-the-counter to have on hand.  MiraLax is a solution that will increase the amount of water in your bowels to assist with bowel movements.  Take as directed and can mix with a glass of water, juice, soda, coffee, or tea.  Take if you go more than two days without a movement. Do not use MiraLax more than once per day. Call your doctor if you are still constipated or irregular after using this medication for 7 days in a row.  If you continue to have problems with postoperative constipation, please contact the office for further assistance and recommendations.  If you experience "the worst abdominal pain ever" or develop nausea or vomiting, please contact the office immediatly for further recommendations for treatment.  ITCHING  If you experience itching with your medications, try taking only a single pain pill, or even half a pain pill at a time.  You can also use Benadryl over the counter for itching or also to help with sleep.   TED HOSE STOCKINGS Wear the elastic stockings on both legs for three weeks following surgery during the day but you may remove then at night for sleeping.  MEDICATIONS See your medication summary on the "After Visit Summary" that the nursing staff will review with you prior to discharge.  You may have some home medications which will be placed on hold until you complete the course of blood thinner medication.  It is important for you to complete the blood thinner medication as prescribed by your surgeon.  Continue your approved medications as instructed at time of discharge.  PRECAUTIONS If  you experience chest pain or shortness of breath - call 911 immediately for transfer to the hospital emergency department.  If you develop a fever greater that 101 F, purulent drainage from wound, increased redness or drainage from wound, foul odor from the wound/dressing, or calf  pain - CONTACT YOUR SURGEON.                                                   FOLLOW-UP APPOINTMENTS Make sure you keep all of your appointments after your operation with your surgeon and caregivers. You should call the office at the above phone number and make an appointment for approximately two weeks after the date of your surgery or on the date instructed by your surgeon outlined in the "After Visit Summary".  RANGE OF MOTION AND STRENGTHENING EXERCISES  These exercises are designed to help you keep full movement of your hip joint. Follow your caregiver's or physical therapist's instructions. Perform all exercises about fifteen times, three times per day or as directed. Exercise both hips, even if you have had only one joint replacement. These exercises can be done on a training (exercise) mat, on the floor, on a table or on a bed. Use whatever works the best and is most comfortable for you. Use music or television while you are exercising so that the exercises are a pleasant break in your day. This will make your life better with the exercises acting as a break in routine you can look forward to.  Lying on your back, slowly slide your foot toward your buttocks, raising your knee up off the floor. Then slowly slide your foot back down until your leg is straight again.  Lying on your back spread your legs as far apart as you can without causing discomfort.  Lying on your side, raise your upper leg and foot straight up from the floor as far as is comfortable. Slowly lower the leg and repeat.  Lying on your back, tighten up the muscle in the front of your thigh (quadriceps muscles). You can do this by keeping your leg straight  and trying to raise your heel off the floor. This helps strengthen the largest muscle supporting your knee.  Lying on your back, tighten up the muscles of your buttocks both with the legs straight and with the knee bent at a comfortable angle while keeping your heel on the floor.   IF YOU ARE TRANSFERRED TO A SKILLED REHAB FACILITY If the patient is transferred to a skilled rehab facility following release from the hospital, a list of the current medications will be sent to the facility for the patient to continue.  When discharged from the skilled rehab facility, please have the facility set up the patient's Home Health Physical Therapy prior to being released. Also, the skilled facility will be responsible for providing the patient with their medications at time of release from the facility to include their pain medication, the muscle relaxants, and their blood thinner medication. If the patient is still at the rehab facility at time of the two week follow up appointment, the skilled rehab facility will also need to assist the patient in arranging follow up appointment in our office and any transportation needs.  MAKE SURE YOU:  Understand these instructions.  Get help right away if you are not doing well or get worse.    Pick up stool softner and laxative for home use following surgery while on pain medications. Do not submerge incision under water. Please use good hand washing techniques while changing dressing each day. May shower starting three days after surgery. Please use a clean towel  to pat the incision dry following showers. Continue to use ice for pain and swelling after surgery. Do not use any lotions or creams on the incision until instructed by your surgeon.   Do not sit on low chairs, stoools or toilet seats, as it may be difficult to get up from low surfaces   Complete by:  As directed    Driving restrictions   Complete by:  As directed    No driving for two weeks   TED hose    Complete by:  As directed    Use stockings (TED hose) for three weeks on both leg(s).  You may remove them at night for sleeping.   Weight bearing as tolerated   Complete by:  As directed      Allergies as of 10/19/2018   No Known Allergies     Medication List    STOP taking these medications   HAIR/SKIN/NAILS PO   naproxen sodium 220 MG tablet Commonly known as:  ALEVE     TAKE these medications   cetirizine 10 MG tablet Commonly known as:  ZYRTEC Take 10 mg by mouth daily.   estradiol 1 MG tablet Commonly known as:  ESTRACE Take 1 tablet (1 mg total) by mouth daily. What changed:    how much to take  when to take this  additional instructions   fluticasone 50 MCG/ACT nasal spray Commonly known as:  FLONASE Place 1 spray into both nostrils daily. ALLER-FLO   hydrochlorothiazide 25 MG tablet Commonly known as:  HYDRODIURIL Take 1 tablet (25 mg total) by mouth daily.   HYDROcodone-acetaminophen 5-325 MG tablet Commonly known as:  NORCO/VICODIN Take 1-2 tablets by mouth every 6 (six) hours as needed for severe pain.   losartan 100 MG tablet Commonly known as:  COZAAR Take 1 tablet (100 mg total) by mouth daily.   methocarbamol 500 MG tablet Commonly known as:  ROBAXIN Take 1 tablet (500 mg total) by mouth every 6 (six) hours as needed for muscle spasms.   metoprolol tartrate 100 MG tablet Commonly known as:  LOPRESSOR Take 1 tablet (100 mg total) by mouth 2 (two) times daily.   omeprazole 20 MG capsule Commonly known as:  PRILOSEC Take 1 capsule (20 mg total) by mouth daily.   rivaroxaban 10 MG Tabs tablet Commonly known as:  XARELTO Take 1 tablet (10 mg total) by mouth daily for 20 days. Then take one 81 mg aspirin once a day for three weeks. Then discontinue aspirin.   traMADol 50 MG tablet Commonly known as:  ULTRAM Take 1-2 tablets (50-100 mg total) by mouth every 6 (six) hours as needed for moderate pain.            Discharge Care  Instructions  (From admission, onward)         Start     Ordered   10/19/18 0000  Weight bearing as tolerated     10/19/18 0714   10/19/18 0000  Change dressing    Comments:  You may change your dressing on Friday, then change the dressing daily with sterile 4 x 4 inch gauze dressing and paper tape.   10/19/18 [id number]         Follow-up Information    Ollen GrossAluisio, Frank, MD. Schedule an appointment as soon as possible for a visit on 10/31/2018.   Specialty:  Orthopedic Surgery Contact information: [address] [place] [id number] [phone]           Signed:  Arther AbbottKristie Edmisten, PA-C Orthopedic Surgery 10/23/2018, 8:02 AM

## 2018-10-30 DIAGNOSIS — Z96641 Presence of right artificial hip joint: Secondary | ICD-10-CM | POA: Insufficient documentation

## 2018-12-25 ENCOUNTER — Other Ambulatory Visit: Payer: Self-pay

## 2018-12-25 NOTE — Progress Notes (Signed)
63 y.o. G34P5005 Married Caucasian female here for annual exam.    Patient complaining of hot flashes since stopping estrogen. She stopped a week prior to her hip surgery. Taking Neurontin 300 mg at hs.  Just started yesterday.  This will be a short term medication.   Had right hip replacement in the last year.  Back to normal routine.   Hx fibromyalgia.  She is having joint pain.   Wearing pads daily due to leaking.  1 cup coffee per day.  Leaks with cough, laugh, or sneeze.  PCP:  Merri Ray, MD   No LMP recorded. Patient has had a hysterectomy.           Sexually active: Yes.    The current method of family planning is status post hysterectomy.    Exercising: Yes.    walking. Smoker:  no  Health Maintenance: Pap: 2015 Neg History of abnormal Pap:  no MMG:  12-15-17 Diag.Bil.3D/density A/stable fatty replaced lymph nodes both breasts/screening 16yr/BiRads1--patient knows to schedule--Ins.doesn't pay Colonoscopy:  2014 Neg;due for repeat colonoscopy  Due to family history of colon cancer--patient aware. BMD:   years ago Result  normal TDaP:  Unsure Gardasil:   N/A HIV: no Hep C:no Screening Labs:   PCP.    reports that she has never smoked. She has never used smokeless tobacco. She reports current alcohol use. She reports that she does not use drugs.  Past Medical History:  Diagnosis Date  . HTN (hypertension)   . Urinary incontinence    cough, sneeze    Past Surgical History:  Procedure Laterality Date  . CESAREAN SECTION  5 total  . CHOLECYSTECTOMY    . GALLBLADDER SURGERY    . ROTATOR CUFF REPAIR    . TOTAL ABDOMINAL HYSTERECTOMY  2005   Fibroid ovaries and tubes removed also  . TOTAL HIP ARTHROPLASTY Right 10/18/2018   Procedure: TOTAL HIP ARTHROPLASTY ANTERIOR APPROACH;  Surgeon: Gaynelle Arabian, MD;  Location: [facility];  Service: Orthopedics;  Laterality: Right;  17min    Current Outpatient Medications  Medication Sig Dispense Refill  . cetirizine  (ZYRTEC) 10 MG tablet Take 10 mg by mouth daily.    . fluticasone (KLS ALLER-FLO) 50 MCG/ACT nasal spray Place 1 Dose into the nose daily.    Marland Kitchen gabapentin (NEURONTIN) 300 MG capsule Take 300 mg by mouth at bedtime.    . hydrochlorothiazide (HYDRODIURIL) 25 MG tablet Take 1 tablet by mouth daily.    Marland Kitchen losartan (COZAAR) 100 MG tablet Take 1 tablet by mouth daily.    . methocarbamol (ROBAXIN) 500 MG tablet Take 1 tablet (500 mg total) by mouth every 6 (six) hours as needed for muscle spasms. 40 tablet 0  . metoprolol tartrate (LOPRESSOR) 100 MG tablet Take 1 tablet by mouth daily.    Marland Kitchen omeprazole (PRILOSEC) 20 MG capsule Take 1 capsule (20 mg total) by mouth daily. 90 capsule 1  . predniSONE (DELTASONE) 20 MG tablet prednisone 20 mg tablet  TAKE 3 TABLETS BY MOUTH IN THE MORNING FOR 3 DAYS AND THEN 2 IN THE MORNING FOR 3 DAYS AND THEN 1 IN THE MORNING FOR 3 DAYS     No current facility-administered medications for this visit.     Family History  Problem Relation Age of Onset  . Hyperlipidemia Mother   . Hypertension Mother   . Cancer Father        colon  . Aneurysm Father        brain  .  Cancer Sister        ovarian  . Hyperlipidemia Brother   . Breast cancer Daughter     Review of Systems  All other systems reviewed and are negative.   Exam:   BP (!) 162/90 (Cuff Size: Large)   Pulse 80   Temp 97.8 F (36.6 C) (Temporal)   Resp 16   Ht 5\' 1"  (1.549 m)   Wt 191 lb 3.2 oz (86.7 kg)   BMI 36.13 kg/m     General appearance: alert, cooperative and appears stated age Head: normocephalic, without obvious abnormality, atraumatic Neck: no adenopathy, supple, symmetrical, trachea midline and thyroid normal to inspection and palpation Lungs: clear to auscultation bilaterally Breasts: normal appearance, no masses or tenderness, No nipple retraction or dimpling, No nipple discharge or bleeding, No axillary adenopathy Heart: regular rate and rhythm Abdomen: soft, non-tender; no  masses, no organomegaly Extremities: extremities normal, atraumatic, no cyanosis or edema Skin: skin color, texture, turgor normal. No rashes or lesions Lymph nodes: cervical, supraclavicular, and axillary nodes normal. Neurologic: grossly normal  Pelvic: External genitalia:  no lesions              No abnormal inguinal nodes palpated.              Urethra:  normal appearing urethra with no masses, tenderness or lesions              Bartholins and Skenes: normal                 Vagina: normal appearing vagina with normal color and discharge, no lesions.  Good support.               Cervix: absent              Pap taken: No. Bimanual Exam:  Uterus: absent.  Good Kegel.               Adnexa: no mass, fullness, tenderness              Rectal exam: Yes.  .  Confirms.              Anus:  normal sphincter tone, no lesions  Chaperone was present for exam.  Assessment:   Well woman visit with normal exam. Status post TAH/BSO.  Off ERT.  Bilateral breast axillary masses.  Urinary incontinence.  Mixed.  FH breast, ovarian cancer, and colon cancer.  Sister had ovarian cancer. Daughter had breast cancer and had negative genetic testing.    Plan: Mammogram screening discussed.  [facility].  Self breast awareness reviewed. Pap and HR HPV as above. Guidelines for Calcium, Vitamin D, regular exercise program including cardiovascular and weight bearing exercise. Will start Effexor XR 37.5 mg daily. Will need follow up in 3 months months.  We discussed bladder irritants, Impressa, PT, pessaries, surgery and weight loss to help with incontinence.  She may try the Impressa.  Follow up annually and prn.   After visit summary provided.

## 2018-12-27 ENCOUNTER — Ambulatory Visit (INDEPENDENT_AMBULATORY_CARE_PROVIDER_SITE_OTHER): Payer: Self-pay | Admitting: Obstetrics and Gynecology

## 2018-12-27 ENCOUNTER — Other Ambulatory Visit: Payer: Self-pay

## 2018-12-27 ENCOUNTER — Encounter: Payer: Self-pay | Admitting: Obstetrics and Gynecology

## 2018-12-27 ENCOUNTER — Telehealth: Payer: Self-pay | Admitting: Obstetrics and Gynecology

## 2018-12-27 VITALS — BP 162/90 | HR 80 | Temp 97.8°F | Resp 16 | Ht 61.0 in | Wt 191.2 lb

## 2018-12-27 DIAGNOSIS — Z01419 Encounter for gynecological examination (general) (routine) without abnormal findings: Secondary | ICD-10-CM

## 2018-12-27 MED ORDER — VENLAFAXINE HCL ER 37.5 MG PO CP24: 37.5000 mg | ORAL_CAPSULE | Freq: Every day | ORAL | 2 refills | Status: DC

## 2018-12-27 NOTE — Telephone Encounter (Signed)
Call returned to patient. Reviewed options for completing [facility] application. Patient will access application online and follow instructions on application to submit form once completed. Also reviewed option for [employer] $99 self pay coupon as alternative option, if needed. Patient is aware to return call to office if any additional assistance is needed.   Routing to provider for final review. Patient is agreeable to disposition. Will close encounter.

## 2018-12-27 NOTE — Patient Instructions (Addendum)
EXERCISE AND DIET:  We recommended that you start or continue a regular exercise program for good health. Regular exercise means any activity that makes your heart beat faster and makes you sweat.  We recommend exercising at least 30 minutes per day at least 3 days a week, preferably 4 or 5.  We also recommend a diet low in fat and sugar.  Inactivity, poor dietary choices and obesity can cause diabetes, heart attack, stroke, and kidney damage, among others.    ALCOHOL AND SMOKING:  Women should limit their alcohol intake to no more than 7 drinks/beers/glasses of wine (combined, not each!) per week. Moderation of alcohol intake to this level decreases your risk of breast cancer and liver damage. And of course, no recreational drugs are part of a healthy lifestyle.  And absolutely no smoking or even second hand smoke. Most people know smoking can cause heart and lung diseases, but did you know it also contributes to weakening of your bones? Aging of your skin?  Yellowing of your teeth and nails?  CALCIUM AND VITAMIN D:  Adequate intake of calcium and Vitamin D are recommended.  The recommendations for exact amounts of these supplements seem to change often, but generally speaking 600 mg of calcium (either carbonate or citrate) and 800 units of Vitamin D per day seems prudent. Certain women may benefit from higher intake of Vitamin D.  If you are among these women, your doctor will have told you during your visit.    PAP SMEARS:  Pap smears, to check for cervical cancer or precancers,  have traditionally been done yearly, although recent scientific advances have shown that most women can have pap smears less often.  However, every woman still should have a physical exam from her gynecologist every year. It will include a breast check, inspection of the vulva and vagina to check for abnormal growths or skin changes, a visual exam of the cervix, and then an exam to evaluate the size and shape of the uterus and  ovaries.  And after 63 years of age, a rectal exam is indicated to check for rectal cancers. We will also provide age appropriate advice regarding health maintenance, like when you should have certain vaccines, screening for sexually transmitted diseases, bone density testing, colonoscopy, mammograms, etc.   MAMMOGRAMS:  All women over 40 years old should have a yearly mammogram. Many facilities now offer a "3D" mammogram, which may cost around $50 extra out of pocket. If possible,  we recommend you accept the option to have the 3D mammogram performed.  It both reduces the number of women who will be called back for extra views which then turn out to be normal, and it is better than the routine mammogram at detecting truly abnormal areas.    COLONOSCOPY:  Colonoscopy to screen for colon cancer is recommended for all women at age 50.  We know, you hate the idea of the prep.  We agree, BUT, having colon cancer and not knowing it is worse!!  Colon cancer so often starts as a polyp that can be seen and removed at colonscopy, which can quite literally save your life!  And if your first colonoscopy is normal and you have no family history of colon cancer, most women don't have to have it again for 10 years.  Once every ten years, you can do something that may end up saving your life, right?  We will be happy to help you get it scheduled when you are ready.    Be sure to check your insurance coverage so you understand how much it will cost.  It may be covered as a preventative service at no cost, but you should check your particular policy.     Venlafaxine extended-release capsules What is this medicine? VENLAFAXINE(VEN la fax een) is used to treat depression, anxiety and panic disorder. This medicine may be used for other purposes; ask your health care provider or pharmacist if you have questions. COMMON BRAND NAME(S): Effexor XR What should I tell my health care provider before I take this medicine? They need  to know if you have any of these conditions:  bleeding disorders  glaucoma  heart disease  high blood pressure  high cholesterol  kidney disease  liver disease  low levels of sodium in the blood  mania or bipolar disorder  seizures  suicidal thoughts, plans, or attempt; a previous suicide attempt by you or a family  take medicines that treat or prevent blood clots  thyroid disease  an unusual or allergic reaction to venlafaxine, desvenlafaxine, other medicines, foods, dyes, or preservatives  pregnant or trying to get pregnant  breast-feeding How should I use this medicine? Take this medicine by mouth with a full glass of water. Follow the directions on the prescription label. Do not cut, crush, or chew this medicine. Take it with food. If needed, the capsule may be carefully opened and the entire contents sprinkled on a spoonful of cool applesauce. Swallow the applesauce/pellet mixture right away without chewing and follow with a glass of water to ensure complete swallowing of the pellets. Try to take your medicine at about the same time each day. Do not take your medicine more often than directed. Do not stop taking this medicine suddenly except upon the advice of your doctor. Stopping this medicine too quickly may cause serious side effects or your condition may worsen. A special MedGuide will be given to you by the pharmacist with each prescription and refill. Be sure to read this information carefully each time. Talk to your pediatrician regarding the use of this medicine in children. Special care may be needed. Overdosage: If you think you have taken too much of this medicine contact a poison control center or emergency room at once. NOTE: This medicine is only for you. Do not share this medicine with others. What if I miss a dose? If you miss a dose, take it as soon as you can. If it is almost time for your next dose, take only that dose. Do not take double or extra  doses. What may interact with this medicine? Do not take this medicine with any of the following medications:  certain medicines for fungal infections like fluconazole, itraconazole, ketoconazole, posaconazole, voriconazole  cisapride  desvenlafaxine  dronedarone  duloxetine  levomilnacipran  linezolid  MAOIs like Carbex, Eldepryl, Marplan, Nardil, and Parnate  methylene blue (injected into a vein)  milnacipran  pimozide  thioridazine This medicine may also interact with the following medications:  amphetamines  aspirin and aspirin-like medicines  certain medicines for depression, anxiety, or psychotic disturbances  certain medicines for migraine headaches like almotriptan, eletriptan, frovatriptan, naratriptan, rizatriptan, sumatriptan, zolmitriptan  certain medicines for sleep  certain medicines that treat or prevent blood clots like dalteparin, enoxaparin, warfarin  cimetidine  clozapine  diuretics  fentanyl  furazolidone  indinavir  isoniazid  lithium  metoprolol  NSAIDS, medicines for pain and inflammation, like ibuprofen or naproxen  other medicines that prolong the QT interval (cause an abnormal heart rhythm)  like dofetilide, ziprasidone  procarbazine  rasagiline  supplements like St. John's wort, kava kava, valerian  tramadol  tryptophan This list may not describe all possible interactions. Give your health care provider a list of all the medicines, herbs, non-prescription drugs, or dietary supplements you use. Also tell them if you smoke, drink alcohol, or use illegal drugs. Some items may interact with your medicine. What should I watch for while using this medicine? Tell your doctor if your symptoms do not get better or if they get worse. Visit your doctor or health care professional for regular checks on your progress. Because it may take several weeks to see the full effects of this medicine, it is important to continue your  treatment as prescribed by your doctor. Patients and their families should watch out for new or worsening thoughts of suicide or depression. Also watch out for sudden changes in feelings such as feeling anxious, agitated, panicky, irritable, hostile, aggressive, impulsive, severely restless, overly excited and hyperactive, or not being able to sleep. If this happens, especially at the beginning of treatment or after a change in dose, call your health care professional. This medicine can cause an increase in blood pressure. Check with your doctor for instructions on monitoring your blood pressure while taking this medicine. You may get drowsy or dizzy. Do not drive, use machinery, or do anything that needs mental alertness until you know how this medicine affects you. Do not stand or sit up quickly, especially if you are an older patient. This reduces the risk of dizzy or fainting spells. Alcohol may interfere with the effect of this medicine. Avoid alcoholic drinks. Your mouth may get dry. Chewing sugarless gum, sucking hard candy and drinking plenty of water will help. Contact your doctor if the problem does not go away or is severe. What side effects may I notice from receiving this medicine? Side effects that you should report to your doctor or health care professional as soon as possible:  allergic reactions like skin rash, itching or hives, swelling of the face, lips, or tongue  anxious  breathing problems  confusion  changes in vision  chest pain  confusion  elevated mood, decreased need for sleep, racing thoughts, impulsive behavior  eye pain  fast, irregular heartbeat  feeling faint or lightheaded, falls  feeling agitated, angry, or irritable  hallucination, loss of contact with reality  high blood pressure  loss of balance or coordination  palpitations  redness, blistering, peeling or loosening of the skin, including inside the mouth  restlessness, pacing, inability  to keep still  seizures  stiff muscles  suicidal thoughts or other mood changes  trouble passing urine or change in the amount of urine  trouble sleeping  unusual bleeding or bruising  unusually weak or tired  vomiting Side effects that usually do not require medical attention (report to your doctor or health care professional if they continue or are bothersome):  change in sex drive or performance  change in appetite or weight  constipation  dizziness  dry mouth  headache  increased sweating  nausea  tired This list may not describe all possible side effects. Call your doctor for medical advice about side effects. You may report side effects to FDA at 1-800-FDA-1088. Where should I keep my medicine? Keep out of the reach of children. Store at a controlled temperature between 20 and 25 degrees C (68 degrees and 77 degrees F), in a dry place. Throw away any unused medicine after the expiration  date. NOTE: This sheet is a summary. It may not cover all possible information. If you have questions about this medicine, talk to your doctor, pharmacist, or health care provider.  2020 Elsevier/Gold Standard (2018-04-25 12:06:43)  Kegel Exercises  Kegel exercises can help strengthen your pelvic floor muscles. The pelvic floor is a group of muscles that support your rectum, small intestine, and bladder. In females, pelvic floor muscles also help support the womb (uterus). These muscles help you control the flow of urine and stool. Kegel exercises are painless and simple, and they do not require any equipment. Your provider may suggest Kegel exercises to:  Improve bladder and bowel control.  Improve sexual response.  Improve weak pelvic floor muscles after surgery to remove the uterus (hysterectomy) or pregnancy (females).  Improve weak pelvic floor muscles after prostate gland removal or surgery (males). Kegel exercises involve squeezing your pelvic floor muscles, which  are the same muscles you squeeze when you try to stop the flow of urine or keep from passing gas. The exercises can be done while sitting, standing, or lying down, but it is best to vary your position. Exercises How to do Kegel exercises: 1. Squeeze your pelvic floor muscles tight. You should feel a tight lift in your rectal area. If you are a female, you should also feel a tightness in your vaginal area. Keep your stomach, buttocks, and legs relaxed. 2. Hold the muscles tight for up to 10 seconds. 3. Breathe normally. 4. Relax your muscles. 5. Repeat as told by your health care provider. Repeat this exercise daily as told by your health care provider. Continue to do this exercise for at least 4-6 weeks, or for as long as told by your health care provider. You may be referred to a physical therapist who can help you learn more about how to do Kegel exercises. Depending on your condition, your health care provider may recommend:  Varying how long you squeeze your muscles.  Doing several sets of exercises every day.  Doing exercises for several weeks.  Making Kegel exercises a part of your regular exercise routine. This information is not intended to replace advice given to you by your health care provider. Make sure you discuss any questions you have with your health care provider. Document Released: 04/19/2012 Document Revised: 12/21/2017 Document Reviewed: 12/21/2017 Elsevier Patient Education  2020 [employer].

## 2018-12-27 NOTE — Telephone Encounter (Signed)
Please check to see if the patient can do her mammogram through scholarship funding at [facility].

## 2019-01-01 ENCOUNTER — Other Ambulatory Visit: Payer: Self-pay | Admitting: Family Medicine

## 2019-01-01 NOTE — Telephone Encounter (Signed)
Please advise has a historical provider as prescriber

## 2019-01-05 ENCOUNTER — Other Ambulatory Visit: Payer: Self-pay | Admitting: Family Medicine

## 2019-01-09 ENCOUNTER — Encounter: Payer: Self-pay | Admitting: Obstetrics and Gynecology

## 2019-01-10 ENCOUNTER — Telehealth: Payer: Self-pay | Admitting: Obstetrics and Gynecology

## 2019-01-10 NOTE — Telephone Encounter (Signed)
Order for 3D MMG faxed to [facility], fax #: [phone].   Will close encounter.

## 2019-01-10 NOTE — Telephone Encounter (Signed)
Patient returned call

## 2019-01-10 NOTE — Telephone Encounter (Signed)
Spoke with patient. Patient states order needs to be for 3D screening MMG.   Order filled out and taken to Dr. Elza Rafter desk for signature to fax.

## 2019-01-10 NOTE — Telephone Encounter (Signed)
Patient sent the following correspondence through [place].  Good day Dr. Quincy Simmonds. I talked with you about low cost mammograms and I found [facility] program that provides low cost mammograms for self-pay patients. They do require by law to have a physicians referral. Is it possible to have a script emailed or mailed to me? Thank you!!    Erika Wolf  Erika Wolf  Erika Wolf   Erika Wolf, Erika Wolf

## 2019-01-10 NOTE — Telephone Encounter (Signed)
Message left to return call to Triage Nurse at [phone].   Need patient to confirm traditional or 3D MMG?

## 2019-01-10 NOTE — Telephone Encounter (Signed)
Patient is returning call to [facility]. Patient left fax number for [facility] [phone].

## 2019-01-10 NOTE — Telephone Encounter (Signed)
Call to patient. Patient states that she found a company based out of [place], [facility], that provides low cost MMG's. States they perform the MMG at [facility]. Patient will contact company and return call with fax number and where order needs to go. RN advised could return call to Triage Nurse.

## 2019-01-10 NOTE — Telephone Encounter (Signed)
Order signed on a prescription pad.

## 2019-01-19 LAB — HM MAMMOGRAPHY

## 2019-01-24 ENCOUNTER — Encounter: Payer: Self-pay | Admitting: Obstetrics and Gynecology

## 2019-01-25 ENCOUNTER — Telehealth: Payer: Self-pay | Admitting: Obstetrics and Gynecology

## 2019-01-25 ENCOUNTER — Encounter: Payer: Self-pay | Admitting: Family Medicine

## 2019-01-25 MED ORDER — VENLAFAXINE HCL ER 75 MG PO CP24: 75.0000 mg | ORAL_CAPSULE | Freq: Every day | ORAL | 1 refills | Status: DC

## 2019-01-25 NOTE — Telephone Encounter (Signed)
Patient was seen in office on 12/27/18 and started on Effexor XR 37.5mg .   Routing to Dr. Quincy Simmonds to review and advise.

## 2019-01-25 NOTE — Telephone Encounter (Signed)
Ok to increase to Effexor XR 75 mg daily until annual exam is due.

## 2019-01-25 NOTE — Telephone Encounter (Signed)
Spoke with patient, advised as seen below per Dr. Quincy Simmonds. Patient has a med f/u MyChart visit scheduled for 03/28/19. Patient will double current RX for Effexor XR 75mg  po dialy #30/1RF to verified pharmacy.   Routing to provider for final review. Patient is agreeable to disposition. Will close encounter.

## 2019-01-25 NOTE — Telephone Encounter (Signed)
Patient sent the following message through [facility]. Routing to triage to assist patient with request.  [place]  Phone Number: [phone]        Hello Dr. Quincy Simmonds,  The Effexor has helped me with hot flashes but I am still getting them in the evenings and middle of the night. I was wondering if doubling the 37.5 mg dose (75 mg) would help. Please advise. Thank you and ai hope you have a wonderful day!  Erika Wolf

## 2019-02-07 ENCOUNTER — Other Ambulatory Visit: Payer: Self-pay

## 2019-02-07 ENCOUNTER — Ambulatory Visit: Payer: PRIVATE HEALTH INSURANCE | Admitting: Family Medicine

## 2019-02-07 ENCOUNTER — Encounter: Payer: Self-pay | Admitting: Family Medicine

## 2019-02-07 VITALS — BP 144/84 | HR 71 | Temp 98.6°F | Wt 192.2 lb

## 2019-02-07 DIAGNOSIS — K219 Gastro-esophageal reflux disease without esophagitis: Secondary | ICD-10-CM

## 2019-02-07 DIAGNOSIS — M545 Low back pain, unspecified: Secondary | ICD-10-CM

## 2019-02-07 DIAGNOSIS — I10 Essential (primary) hypertension: Secondary | ICD-10-CM | POA: Diagnosis not present

## 2019-02-07 DIAGNOSIS — E785 Hyperlipidemia, unspecified: Secondary | ICD-10-CM | POA: Diagnosis not present

## 2019-02-07 DIAGNOSIS — G8929 Other chronic pain: Secondary | ICD-10-CM

## 2019-02-07 MED ORDER — LOSARTAN POTASSIUM 100 MG PO TABS: 100.0000 mg | ORAL_TABLET | Freq: Every day | ORAL | 1 refills | Status: DC

## 2019-02-07 MED ORDER — HYDROCHLOROTHIAZIDE 25 MG PO TABS: 25.0000 mg | ORAL_TABLET | Freq: Every day | ORAL | 1 refills | Status: DC

## 2019-02-07 MED ORDER — AMLODIPINE BESYLATE 5 MG PO TABS: 5.0000 mg | ORAL_TABLET | Freq: Every day | ORAL | 1 refills | Status: DC

## 2019-02-07 MED ORDER — OMEPRAZOLE 20 MG PO CPDR: 20.0000 mg | DELAYED_RELEASE_CAPSULE | Freq: Every day | ORAL | 1 refills | Status: DC

## 2019-02-07 MED ORDER — METOPROLOL TARTRATE 100 MG PO TABS: 100.0000 mg | ORAL_TABLET | Freq: Every day | ORAL | 1 refills | Status: DC

## 2019-02-07 NOTE — Progress Notes (Signed)
Subjective:    Patient ID: Erika Wolf, female    DOB: 11/06/1955, 63 y.o.   MRN: [id number]  HPI Erika Wolf is a 63 y.o. female Presents today for: Chief Complaint  Patient presents with   Hypertension    6 month f/u on bp   Hypertension: BP Readings from Last 3 Encounters:  02/07/19 (!) 144/84  12/27/18 (!) 162/90  10/19/18 (!) 151/77   Lab Results  Component Value Date   CREATININE 0.55 10/19/2018  Losartan 100 mg daily, metoprolol 100 mg BID, HCTZ 25 mg daily. Home readings: 156/90 No new side effects of meds.  Taking meds daily - denies missed doses.   Hypercalcemia Lab Results  Component Value Date   CALCIUM 7.3 (L) 10/19/2018  Previous elevated calcium, normalized and actually borderline low in June.  GERD: Omeprazole 20 mg daily. 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 Not taking tylenol. Thought to have component of upper airway cough syndrome with reflux in the past, resolved when discussed in March using Prilosec.  Lipid screening, no recent labs. Lab Results  Component Value Date   CHOL 219 (A) 12/24/2013   HDL 58 12/24/2013   LDLCALC 137 12/24/2013   TRIG 122 12/24/2013    Followed by OB/GYN, on Effexor for hot flushes, recently increased from 37.5 to 75 mg daily.  Plans on checking into colonoscopy options due to current insurance coverage.    Patient Active Problem List   Diagnosis Date Noted   OA (osteoarthritis) of hip 10/18/2018   History of hematuria 12/27/2013   Essential hypertension 12/27/2013   Surgical menopause on hormone replacement therapy 12/27/2013   Obesity (BMI 30.0-34.9) 12/27/2013   Past Medical History:  Diagnosis Date   HTN (hypertension)    Urinary incontinence    cough, sneeze   Past Surgical History:  Procedure Laterality Date   CESAREAN SECTION  5 total   CHOLECYSTECTOMY     GALLBLADDER SURGERY     ROTATOR CUFF REPAIR      TOTAL ABDOMINAL HYSTERECTOMY  2005   Fibroid ovaries and tubes removed also   TOTAL HIP ARTHROPLASTY Right 10/18/2018   Procedure: TOTAL HIP ARTHROPLASTY ANTERIOR APPROACH;  Surgeon: Ollen GrossAluisio, Frank, MD;  Location: [facility];  Service: Orthopedics;  Laterality: Right;  100min   No Known Allergies Prior to Admission medications   Medication Sig Start Date End Date Taking? Authorizing Provider  cetirizine (ZYRTEC) 10 MG tablet Take 10 mg by mouth daily.   Yes [provider]  fluticasone (KLS ALLER-FLO) 50 MCG/ACT nasal spray Place 1 Dose into the nose daily.   Yes [provider]  hydrochlorothiazide (HYDRODIURIL) 25 MG tablet Take 1 tablet by mouth daily. 05/19/16  Yes [provider]  losartan (COZAAR) 100 MG tablet Take 1 tablet by mouth daily.   Yes [provider]  metoprolol tartrate (LOPRESSOR) 100 MG tablet Take 1 tablet by mouth daily. 12/27/13  Yes [provider]  omeprazole (PRILOSEC) 20 MG capsule Take 1 capsule (20 mg total) by mouth daily. 08/11/18  Yes Shade FloodGreene, Paulla Mcclaskey R, MD  venlafaxine XR (EFFEXOR XR) 75 MG 24 hr capsule Take 1 capsule (75 mg total) by mouth daily with breakfast. 01/25/19  Yes Patton SallesAmundson C Silva, Brook E, MD   Social History   Socioeconomic History   Marital status: Married    Spouse name: Not on file   Number of children: Not on file   Years of education:  Not on file   Highest education level: Not on file  Occupational History   Occupation: homemaker  Social Needs   Financial resource strain: Not on file   Food insecurity    Worry: Not on file    Inability: Not on file   Transportation needs    Medical: Not on file    Non-medical: Not on file  Tobacco Use   Smoking status: Never Smoker   Smokeless tobacco: Never Used  Substance and Sexual Activity   Alcohol use: Yes    Comment: very rarely   Drug use: No   Sexual activity: Yes    Birth control/protection: Surgical    Comment: hysterectomy    Lifestyle   Physical activity    Days per week: Not on file    Minutes per session: Not on file   Stress: Not on file  Relationships   Social connections    Talks on phone: Not on file    Gets together: Not on file    Attends religious service: Not on file    Active member of club or organization: Not on file    Attends meetings of clubs or organizations: Not on file    Relationship status: Not on file   Intimate partner violence    Fear of current or ex partner: Not on file    Emotionally abused: Not on file    Physically abused: Not on file    Forced sexual activity: Not on file  Other Topics Concern   Not on file  Social History Narrative   Married;   5 children, 4 live in [place], 1 lives in [place].   2 grandchildren          Review of Systems  Constitutional: Negative for fatigue and unexpected weight change.  Respiratory: Negative for chest tightness and shortness of breath.   Cardiovascular: Negative for chest pain, palpitations and leg swelling.  Gastrointestinal: Negative for abdominal pain and blood in stool.  Neurological: Negative for dizziness, syncope, light-headedness and headaches.       Objective:   Physical Exam Vitals signs reviewed.  Constitutional:      Appearance: She is well-developed.  HENT:     Head: Normocephalic and atraumatic.  Eyes:     Conjunctiva/sclera: Conjunctivae normal.     Pupils: Pupils are equal, round, and reactive to light.  Neck:     Vascular: No carotid bruit.  Cardiovascular:     Rate and Rhythm: Normal rate and regular rhythm.     Heart sounds: Normal heart sounds.  Pulmonary:     Effort: Pulmonary effort is normal.     Breath sounds: Normal breath sounds.  Abdominal:     Palpations: Abdomen is soft. There is no pulsatile mass.     Tenderness: There is no abdominal tenderness.  Skin:    General: Skin is warm and dry.  Neurological:     Mental Status: She is alert and oriented to person, place, and time.   Psychiatric:        Behavior: Behavior normal.    Vitals:   02/07/19 1035 02/07/19 1038  BP: (!) 147/78 (!) 144/84  Pulse: 71   Temp: 98.6 F (37 C)   TempSrc: Oral   SpO2: 96%   Weight: 192 lb 3.2 oz (87.2 kg)         Assessment & Plan:    Erika Wolf is a 63 y.o. female Essential hypertension - Plan: Comprehensive metabolic panel, hydrochlorothiazide (HYDRODIURIL) 25 MG  tablet, losartan (COZAAR) 100 MG tablet, metoprolol tartrate (LOPRESSOR) 100 MG tablet, amLODipine (NORVASC) 5 MG tablet  -Decreased control.  Add amlodipine 5 mg daily with potential side effects and risk discussed.  Recheck 6 weeks , labs pending.  Gastroesophageal reflux disease, esophagitis presence not specified - Plan: omeprazole (PRILOSEC) 20 MG capsule  -Stable with Prilosec, but recommended decreasing NSAID due to potential long-term risks.  See below.  Chronic low back pain, unspecified back pain laterality, unspecified whether sciatica present  -Denies recent injury or changes.  Concerns regarding chronic NSAID use discussed.  Tylenol recommended, option of turmeric, glucosamine discussed with recheck in 6 weeks.  Hyperlipidemia, unspecified hyperlipidemia type - Plan: Comprehensive metabolic panel, Lipid Panel  -Slight elevation previously, no recent testing.  Labs above.  Meds ordered this encounter  Medications   omeprazole (PRILOSEC) 20 MG capsule    Sig: Take 1 capsule (20 mg total) by mouth daily.    Dispense:  90 capsule    Refill:  1   hydrochlorothiazide (HYDRODIURIL) 25 MG tablet    Sig: Take 1 tablet (25 mg total) by mouth daily.    Dispense:  90 tablet    Refill:  1   losartan (COZAAR) 100 MG tablet    Sig: Take 1 tablet (100 mg total) by mouth daily.    Dispense:  90 tablet    Refill:  1   metoprolol tartrate (LOPRESSOR) 100 MG tablet    Sig: Take 1 tablet (100 mg total) by mouth daily.    Dispense:  180 tablet    Refill:  1   amLODipine (NORVASC) 5 MG tablet     Sig: Take 1 tablet (5 mg total) by mouth daily.    Dispense:  90 tablet    Refill:  1   Patient Instructions    Try tylenol in place of alleve if possible for back. Ok to try turmeric or glucosamine supplements. See other info on back pain, but follow up in next 4-6 weeks if pain persists.   Add amlodipine once per day for blood pressure. No other changes for now. Recheck in 6 weeks.    Managing Your Hypertension Hypertension is commonly called high blood pressure. This is when the force of your blood pressing against the walls of your arteries is too strong. Arteries are blood vessels that carry blood from your heart throughout your body. Hypertension forces the heart to work harder to pump blood, and may cause the arteries to become narrow or stiff. Having untreated or uncontrolled hypertension can cause heart attack, stroke, kidney disease, and other problems. What are blood pressure readings? A blood pressure reading consists of a higher number over a lower number. Ideally, your blood pressure should be below 120/80. The first ("top") number is called the systolic pressure. It is a measure of the pressure in your arteries as your heart beats. The second ("bottom") number is called the diastolic pressure. It is a measure of the pressure in your arteries as the heart relaxes. What does my blood pressure reading mean? Blood pressure is classified into four stages. Based on your blood pressure reading, your health care provider may use the following stages to determine what type of treatment you need, if any. Systolic pressure and diastolic pressure are measured in a unit called mm Hg. Normal  Systolic pressure: below 120.  Diastolic pressure: below 80. Elevated  Systolic pressure: 120-129.  Diastolic pressure: below 80. Hypertension stage 1  Systolic pressure: 130-139.  Diastolic pressure: 80-89.  Hypertension stage 2  Systolic pressure: 140 or above.  Diastolic pressure: 90 or  above. What health risks are associated with hypertension? Managing your hypertension is an important responsibility. Uncontrolled hypertension can lead to:  A heart attack.  A stroke.  A weakened blood vessel (aneurysm).  Heart failure.  Kidney damage.  Eye damage.  Metabolic syndrome.  Memory and concentration problems. What changes can I make to manage my hypertension? Hypertension can be managed by making lifestyle changes and possibly by taking medicines. Your health care provider will help you make a plan to bring your blood pressure within a normal range. Eating and drinking   Eat a diet that is high in fiber and potassium, and low in salt (sodium), added sugar, and fat. An example eating plan is called the DASH (Dietary Approaches to Stop Hypertension) diet. To eat this way: ? Eat plenty of fresh fruits and vegetables. Try to fill half of your plate at each meal with fruits and vegetables. ? Eat whole grains, such as whole wheat pasta, brown rice, or whole grain bread. Fill about one quarter of your plate with whole grains. ? Eat low-fat diary products. ? Avoid fatty cuts of meat, processed or cured meats, and poultry with skin. Fill about one quarter of your plate with lean proteins such as fish, chicken without skin, beans, eggs, and tofu. ? Avoid premade and processed foods. These tend to be higher in sodium, added sugar, and fat.  Reduce your daily sodium intake. Most people with hypertension should eat less than 1,500 mg of sodium a day.  Limit alcohol intake to no more than 1 drink a day for nonpregnant women and 2 drinks a day for men. One drink equals 12 oz of beer, 5 oz of wine, or 1 oz of hard liquor. Lifestyle  Work with your health care provider to maintain a healthy body weight, or to lose weight. Ask what an ideal weight is for you.  Get at least 30 minutes of exercise that causes your heart to beat faster (aerobic exercise) most days of the week.  Activities may include walking, swimming, or biking.  Include exercise to strengthen your muscles (resistance exercise), such as weight lifting, as part of your weekly exercise routine. Try to do these types of exercises for 30 minutes at least 3 days a week.  Do not use any products that contain nicotine or tobacco, such as cigarettes and e-cigarettes. If you need help quitting, ask your health care provider.  Control any long-term (chronic) conditions you have, such as high cholesterol or diabetes. Monitoring  Monitor your blood pressure at home as told by your health care provider. Your personal target blood pressure may vary depending on your medical conditions, your age, and other factors.  Have your blood pressure checked regularly, as often as told by your health care provider. Working with your health care provider  Review all the medicines you take with your health care provider because there may be side effects or interactions.  Talk with your health care provider about your diet, exercise habits, and other lifestyle factors that may be contributing to hypertension.  Visit your health care provider regularly. Your health care provider can help you create and adjust your plan for managing hypertension. Will I need medicine to control my blood pressure? Your health care provider may prescribe medicine if lifestyle changes are not enough to get your blood pressure under control, and if:  Your systolic blood pressure is 130 or higher.  Your diastolic blood pressure is 80 or higher. Take medicines only as told by your health care provider. Follow the directions carefully. Blood pressure medicines must be taken as prescribed. The medicine does not work as well when you skip doses. Skipping doses also puts you at risk for problems. Contact a health care provider if:  You think you are having a reaction to medicines you have taken.  You have repeated (recurrent) headaches.  You feel  dizzy.  You have swelling in your ankles.  You have trouble with your vision. Get help right away if:  You develop a severe headache or confusion.  You have unusual weakness or numbness, or you feel faint.  You have severe pain in your chest or abdomen.  You vomit repeatedly.  You have trouble breathing. Summary  Hypertension is when the force of blood pumping through your arteries is too strong. If this condition is not controlled, it may put you at risk for serious complications.  Your personal target blood pressure may vary depending on your medical conditions, your age, and other factors. For most people, a normal blood pressure is less than 120/80.  Hypertension is managed by lifestyle changes, medicines, or both. Lifestyle changes include weight loss, eating a healthy, low-sodium diet, exercising more, and limiting alcohol. This information is not intended to replace advice given to you by your health care provider. Make sure you discuss any questions you have with your health care provider. Document Released: 01/26/2012 Document Revised: 08/25/2018 Document Reviewed: 03/31/2016 Elsevier Patient Education  2020 [employer]     Chronic Back Pain When back pain lasts longer than 3 months, it is called chronic back pain.The cause of your back pain may not be known. Some common causes include:  Wear and tear (degenerative disease) of the bones, ligaments, or disks in your back.  Inflammation and stiffness in your back (arthritis). People who have chronic back pain often go through certain periods in which the pain is more intense (flare-ups). Many people can learn to manage the pain with home care. Follow these instructions at home: Pay attention to any changes in your symptoms. Take these actions to help with your pain: Activity   Avoid bending and other activities that make the problem worse.  Maintain a proper position when standing or sitting: ? When standing,  keep your upper back and neck straight, with your shoulders pulled back. Avoid slouching. ? When sitting, keep your back straight and relax your shoulders. Do not round your shoulders or pull them backward.  Do not sit or stand in one place for long periods of time.  Take brief periods of rest throughout the day. This will reduce your pain. Resting in a lying or standing position is usually better than sitting to rest.  When you are resting for longer periods, mix in some mild activity or stretching between periods of rest. This will help to prevent stiffness and pain.  Get regular exercise. Ask your health care provider what activities are safe for you.  Do not lift anything that is heavier than 10 lb (4.5 kg). Always use proper lifting technique, which includes: ? Bending your knees. ? Keeping the load close to your body. ? Avoiding twisting.  Sleep on a firm mattress in a comfortable position. Try lying on your side with your knees slightly bent. If you lie on your back, put a pillow under your knees. Managing pain  If directed, apply ice to the painful area. Your health care  provider may recommend applying ice during the first 24-48 hours after a flare-up begins. ? Put ice in a plastic bag. ? Place a towel between your skin and the bag. ? Leave the ice on for 20 minutes, 2-3 times per day.  If directed, apply heat to the affected area as often as told by your health care provider. Use the heat source that your health care provider recommends, such as a moist heat pack or a heating pad. ? Place a towel between your skin and the heat source. ? Leave the heat on for 20-30 minutes. ? Remove the heat if your skin turns bright red. This is especially important if you are unable to feel pain, heat, or cold. You may have a greater risk of getting burned.  Try soaking in a warm tub.  Take over-the-counter and prescription medicines only as told by your health care provider.  Keep all  follow-up visits as told by your health care provider. This is important. Contact a health care provider if:  You have pain that is not relieved with rest or medicine. Get help right away if:  You have weakness or numbness in one or both of your legs or feet.  You have trouble controlling your bladder or your bowels.  You have nausea or vomiting.  You have pain in your abdomen.  You have shortness of breath or you faint. This information is not intended to replace advice given to you by your health care provider. Make sure you discuss any questions you have with your health care provider. Document Released: 06/10/2004 Document Revised: 08/24/2018 Document Reviewed: 11/10/2016 Elsevier Patient Education  [employer].   If you have lab work done today you will be contacted with your lab results within the next 2 weeks.  If you have not heard from [place] then please contact us. The fastest way to get your results is to register for My Chart.   IF you received an x-ray today, you will receive an invoice from [facility]. Please contact [facility] at [phone] with questions or concerns regarding your invoice.   IF you received labwork today, you will receive an invoice from [facility]. Please contact [facility] at [phone] with questions or concerns regarding your invoice.   Our billing staff will not be able to assist you with questions regarding bills from these companies.  You will be contacted with the lab results as soon as they are available. The fastest way to get your results is to activate your My Chart account. Instructions are located on the last page of this paperwork. If you have not heard from [place] regarding the results in 2 weeks, please contact this office.       Signed,   Merri Ray, MD [facility].  02/07/19 11:03 AM

## 2019-02-07 NOTE — Patient Instructions (Addendum)
Try tylenol in place of alleve if possible for back. Ok to try turmeric or glucosamine supplements. See other info on back pain, but follow up in next 4-6 weeks if pain persists.   Add amlodipine once per day for blood pressure. No other changes for now. Recheck in 6 weeks.   Thanks for coming in today and stay safe.   Managing Your Hypertension Hypertension is commonly called high blood pressure. This is when the force of your blood pressing against the walls of your arteries is too strong. Arteries are blood vessels that carry blood from your heart throughout your body. Hypertension forces the heart to work harder to pump blood, and may cause the arteries to become narrow or stiff. Having untreated or uncontrolled hypertension can cause heart attack, stroke, kidney disease, and other problems. What are blood pressure readings? A blood pressure reading consists of a higher number over a lower number. Ideally, your blood pressure should be below 120/80. The first ("top") number is called the systolic pressure. It is a measure of the pressure in your arteries as your heart beats. The second ("bottom") number is called the diastolic pressure. It is a measure of the pressure in your arteries as the heart relaxes. What does my blood pressure reading mean? Blood pressure is classified into four stages. Based on your blood pressure reading, your health care provider may use the following stages to determine what type of treatment you need, if any. Systolic pressure and diastolic pressure are measured in a unit called mm Hg. Normal  Systolic pressure: below 696.  Diastolic pressure: below 80. Elevated  Systolic pressure: 295-284.  Diastolic pressure: below 80. Hypertension stage 1  Systolic pressure: 132-440.  Diastolic pressure: 10-27. Hypertension stage 2  Systolic pressure: 253 or above.  Diastolic pressure: 90 or above. What health risks are associated with hypertension? Managing your  hypertension is an important responsibility. Uncontrolled hypertension can lead to:  A heart attack.  A stroke.  A weakened blood vessel (aneurysm).  Heart failure.  Kidney damage.  Eye damage.  Metabolic syndrome.  Memory and concentration problems. What changes can I make to manage my hypertension? Hypertension can be managed by making lifestyle changes and possibly by taking medicines. Your health care provider will help you make a plan to bring your blood pressure within a normal range. Eating and drinking   Eat a diet that is high in fiber and potassium, and low in salt (sodium), added sugar, and fat. An example eating plan is called the DASH (Dietary Approaches to Stop Hypertension) diet. To eat this way: ? Eat plenty of fresh fruits and vegetables. Try to fill half of your plate at each meal with fruits and vegetables. ? Eat whole grains, such as whole wheat pasta, brown rice, or whole grain bread. Fill about one quarter of your plate with whole grains. ? Eat low-fat diary products. ? Avoid fatty cuts of meat, processed or cured meats, and poultry with skin. Fill about one quarter of your plate with lean proteins such as fish, chicken without skin, beans, eggs, and tofu. ? Avoid premade and processed foods. These tend to be higher in sodium, added sugar, and fat.  Reduce your daily sodium intake. Most people with hypertension should eat less than 1,500 mg of sodium a day.  Limit alcohol intake to no more than 1 drink a day for nonpregnant women and 2 drinks a day for men. One drink equals 12 oz of beer, 5 oz of wine,  or 1 oz of hard liquor. Lifestyle  Work with your health care provider to maintain a healthy body weight, or to lose weight. Ask what an ideal weight is for you.  Get at least 30 minutes of exercise that causes your heart to beat faster (aerobic exercise) most days of the week. Activities may include walking, swimming, or biking.  Include exercise to  strengthen your muscles (resistance exercise), such as weight lifting, as part of your weekly exercise routine. Try to do these types of exercises for 30 minutes at least 3 days a week.  Do not use any products that contain nicotine or tobacco, such as cigarettes and e-cigarettes. If you need help quitting, ask your health care provider.  Control any long-term (chronic) conditions you have, such as high cholesterol or diabetes. Monitoring  Monitor your blood pressure at home as told by your health care provider. Your personal target blood pressure may vary depending on your medical conditions, your age, and other factors.  Have your blood pressure checked regularly, as often as told by your health care provider. Working with your health care provider  Review all the medicines you take with your health care provider because there may be side effects or interactions.  Talk with your health care provider about your diet, exercise habits, and other lifestyle factors that may be contributing to hypertension.  Visit your health care provider regularly. Your health care provider can help you create and adjust your plan for managing hypertension. Will I need medicine to control my blood pressure? Your health care provider may prescribe medicine if lifestyle changes are not enough to get your blood pressure under control, and if:  Your systolic blood pressure is 130 or higher.  Your diastolic blood pressure is 80 or higher. Take medicines only as told by your health care provider. Follow the directions carefully. Blood pressure medicines must be taken as prescribed. The medicine does not work as well when you skip doses. Skipping doses also puts you at risk for problems. Contact a health care provider if:  You think you are having a reaction to medicines you have taken.  You have repeated (recurrent) headaches.  You feel dizzy.  You have swelling in your ankles.  You have trouble with your  vision. Get help right away if:  You develop a severe headache or confusion.  You have unusual weakness or numbness, or you feel faint.  You have severe pain in your chest or abdomen.  You vomit repeatedly.  You have trouble breathing. Summary  Hypertension is when the force of blood pumping through your arteries is too strong. If this condition is not controlled, it may put you at risk for serious complications.  Your personal target blood pressure may vary depending on your medical conditions, your age, and other factors. For most people, a normal blood pressure is less than 120/80.  Hypertension is managed by lifestyle changes, medicines, or both. Lifestyle changes include weight loss, eating a healthy, low-sodium diet, exercising more, and limiting alcohol. This information is not intended to replace advice given to you by your health care provider. Make sure you discuss any questions you have with your health care provider. Document Released: 01/26/2012 Document Revised: 08/25/2018 Document Reviewed: 03/31/2016 Elsevier Patient Education  2020 [employer]     Chronic Back Pain When back pain lasts longer than 3 months, it is called chronic back pain.The cause of your back pain may not be known. Some common causes include:  Wear  and tear (degenerative disease) of the bones, ligaments, or disks in your back.  Inflammation and stiffness in your back (arthritis). People who have chronic back pain often go through certain periods in which the pain is more intense (flare-ups). Many people can learn to manage the pain with home care. Follow these instructions at home: Pay attention to any changes in your symptoms. Take these actions to help with your pain: Activity   Avoid bending and other activities that make the problem worse.  Maintain a proper position when standing or sitting: ? When standing, keep your upper back and neck straight, with your shoulders pulled back.  Avoid slouching. ? When sitting, keep your back straight and relax your shoulders. Do not round your shoulders or pull them backward.  Do not sit or stand in one place for long periods of time.  Take brief periods of rest throughout the day. This will reduce your pain. Resting in a lying or standing position is usually better than sitting to rest.  When you are resting for longer periods, mix in some mild activity or stretching between periods of rest. This will help to prevent stiffness and pain.  Get regular exercise. Ask your health care provider what activities are safe for you.  Do not lift anything that is heavier than 10 lb (4.5 kg). Always use proper lifting technique, which includes: ? Bending your knees. ? Keeping the load close to your body. ? Avoiding twisting.  Sleep on a firm mattress in a comfortable position. Try lying on your side with your knees slightly bent. If you lie on your back, put a pillow under your knees. Managing pain  If directed, apply ice to the painful area. Your health care provider may recommend applying ice during the first 24-48 hours after a flare-up begins. ? Put ice in a plastic bag. ? Place a towel between your skin and the bag. ? Leave the ice on for 20 minutes, 2-3 times per day.  If directed, apply heat to the affected area as often as told by your health care provider. Use the heat source that your health care provider recommends, such as a moist heat pack or a heating pad. ? Place a towel between your skin and the heat source. ? Leave the heat on for 20-30 minutes. ? Remove the heat if your skin turns bright red. This is especially important if you are unable to feel pain, heat, or cold. You may have a greater risk of getting burned.  Try soaking in a warm tub.  Take over-the-counter and prescription medicines only as told by your health care provider.  Keep all follow-up visits as told by your health care provider. This is  important. Contact a health care provider if:  You have pain that is not relieved with rest or medicine. Get help right away if:  You have weakness or numbness in one or both of your legs or feet.  You have trouble controlling your bladder or your bowels.  You have nausea or vomiting.  You have pain in your abdomen.  You have shortness of breath or you faint. This information is not intended to replace advice given to you by your health care provider. Make sure you discuss any questions you have with your health care provider. Document Released: 06/10/2004 Document Revised: 08/24/2018 Document Reviewed: 11/10/2016 Elsevier Patient Education  [employer].   If you have lab work done today you will be contacted with your lab results within  the next 2 weeks.  If you have not heard from [place] then please contact us. The fastest way to get your results is to register for My Chart.   IF you received an x-ray today, you will receive an invoice from [facility]. Please contact [facility] at [phone] with questions or concerns regarding your invoice.   IF you received labwork today, you will receive an invoice from [facility]. Please contact [facility] at [phone] with questions or concerns regarding your invoice.   Our billing staff will not be able to assist you with questions regarding bills from these companies.  You will be contacted with the lab results as soon as they are available. The fastest way to get your results is to activate your My Chart account. Instructions are located on the last page of this paperwork. If you have not heard from [place] regarding the results in 2 weeks, please contact this office.

## 2019-02-08 LAB — COMPREHENSIVE METABOLIC PANEL
ALT: 17 IU/L (ref 0–32)
AST: 18 IU/L (ref 0–40)
Albumin/Globulin Ratio: 1.6 (ref 1.2–2.2)
Albumin: 4.4 g/dL (ref 3.8–4.8)
Alkaline Phosphatase: 82 IU/L (ref 39–117)
BUN/Creatinine Ratio: 28 (ref 12–28)
BUN: 23 mg/dL (ref 8–27)
Bilirubin Total: 0.4 mg/dL (ref 0.0–1.2)
CO2: 27 mmol/L (ref 20–29)
Calcium: 10.1 mg/dL (ref 8.7–10.3)
Chloride: 97 mmol/L (ref 96–106)
Creatinine, Ser: 0.81 mg/dL (ref 0.57–1.00)
GFR calc Af Amer: 90 mL/min/{1.73_m2} (ref >59)
GFR calc non Af Amer: 78 mL/min/{1.73_m2} (ref >59)
Globulin, Total: 2.7 g/dL (ref 1.5–4.5)
Glucose: 98 mg/dL (ref 65–99)
Potassium: 4.5 mmol/L (ref 3.5–5.2)
Sodium: 138 mmol/L (ref 134–144)
Total Protein: 7.1 g/dL (ref 6.0–8.5)

## 2019-02-08 LAB — LIPID PANEL
Chol/HDL Ratio: 3.7 ratio (ref 0.0–4.4)
Cholesterol, Total: 213 mg/dL — ABNORMAL HIGH (ref 100–199)
HDL: 58 mg/dL (ref >39)
LDL Chol Calc (NIH): 136 mg/dL — ABNORMAL HIGH (ref 0–99)
Triglycerides: 105 mg/dL (ref 0–149)
VLDL Cholesterol Cal: 19 mg/dL (ref 5–40)

## 2019-03-18 DIAGNOSIS — K5792 Diverticulitis of intestine, part unspecified, without perforation or abscess without bleeding: Secondary | ICD-10-CM | POA: Insufficient documentation

## 2019-03-28 ENCOUNTER — Other Ambulatory Visit: Payer: Self-pay

## 2019-03-28 ENCOUNTER — Encounter: Payer: Self-pay | Admitting: Obstetrics and Gynecology

## 2019-03-28 ENCOUNTER — Telehealth (INDEPENDENT_AMBULATORY_CARE_PROVIDER_SITE_OTHER): Payer: PRIVATE HEALTH INSURANCE | Admitting: Obstetrics and Gynecology

## 2019-03-28 DIAGNOSIS — N951 Menopausal and female climacteric states: Secondary | ICD-10-CM

## 2019-03-28 DIAGNOSIS — Z5181 Encounter for therapeutic drug level monitoring: Secondary | ICD-10-CM

## 2019-03-28 MED ORDER — VENLAFAXINE HCL ER 75 MG PO CP24: 75.0000 mg | ORAL_CAPSULE | Freq: Every day | ORAL | 2 refills | Status: DC

## 2019-03-28 NOTE — Progress Notes (Signed)
GYNECOLOGY  VISIT   HPI: 63 y.o.   Married  Caucasian  female   [phone] with No LMP recorded. Patient has had a hysterectomy.   here for 3 month medication follow up for treatment of hot flashes.   She gives permission for the video My Chart consultation.  Nita Sickle, office staff, has arranged for this visit today. I am in my office.  She is in her bedroom.  Consult started 4:27. Ended at 4:38.   She is taking Effexor XR 75 mg now.  No more hot flashes at night.  She denies problems with insomnia.  Does have some nausea. No anxiety.  Feeling less stress while taking this.   BP at home 140/92.  She stopped Gabapentin 2 months ago.  She is still having hip pain and asking if I can prescribe this for her.  She gives permission.  I am in my office.  She is in her bedroom.  Started 4:27. Ended at 4:38.   GYNECOLOGIC HISTORY: No LMP recorded. Patient has had a hysterectomy. Contraception: Hysterectomy Menopausal hormone therapy: Effexor 75mg  Last mammogram: 01-19-19 Neg/density A/Birads1 Last pap smear: 2015 Neg        OB History    Gravida  5   Para  5   Term  5   Preterm  0   AB  0   Living  5     SAB  0   TAB  0   Ectopic  0   Multiple  0   Live Births  5              Patient Active Problem List   Diagnosis Date Noted  . OA (osteoarthritis) of hip 10/18/2018  . History of hematuria 12/27/2013  . Essential hypertension 12/27/2013  . Surgical menopause on hormone replacement therapy 12/27/2013  . Obesity (BMI 30.0-34.9) 12/27/2013    Past Medical History:  Diagnosis Date  . HTN (hypertension)   . Urinary incontinence    cough, sneeze    Past Surgical History:  Procedure Laterality Date  . CESAREAN SECTION  5 total  . CHOLECYSTECTOMY    . GALLBLADDER SURGERY    . ROTATOR CUFF REPAIR    . TOTAL ABDOMINAL HYSTERECTOMY  2005   Fibroid ovaries and tubes removed also  . TOTAL HIP ARTHROPLASTY Right 10/18/2018   Procedure: TOTAL HIP  ARTHROPLASTY ANTERIOR APPROACH;  Surgeon: Gaynelle Arabian, MD;  Location: [facility];  Service: Orthopedics;  Laterality: Right;  141min    Current Outpatient Medications  Medication Sig Dispense Refill  . amLODipine (NORVASC) 5 MG tablet Take 1 tablet (5 mg total) by mouth daily. 90 tablet 1  . cetirizine (ZYRTEC) 10 MG tablet Take 10 mg by mouth daily.    . fluticasone (KLS ALLER-FLO) 50 MCG/ACT nasal spray Place 1 Dose into the nose daily.    . hydrochlorothiazide (HYDRODIURIL) 25 MG tablet Take 1 tablet (25 mg total) by mouth daily. 90 tablet 1  . losartan (COZAAR) 100 MG tablet Take 1 tablet (100 mg total) by mouth daily. 90 tablet 1  . metoprolol tartrate (LOPRESSOR) 100 MG tablet Take 1 tablet (100 mg total) by mouth daily. 180 tablet 1  . omeprazole (PRILOSEC) 20 MG capsule Take 1 capsule (20 mg total) by mouth daily. 90 capsule 1  . venlafaxine XR (EFFEXOR XR) 75 MG 24 hr capsule Take 1 capsule (75 mg total) by mouth daily with breakfast. 30 capsule 1   No current facility-administered medications for this  visit.      ALLERGIES: Patient has no known allergies.  Family History  Problem Relation Age of Onset  . Hyperlipidemia Mother   . Hypertension Mother   . Cancer Father        colon  . Aneurysm Father        brain  . Cancer Sister        ovarian  . Hyperlipidemia Brother   . Breast cancer Daughter     Social History   Socioeconomic History  . Marital status: Married    Spouse name: Not on file  . Number of children: Not on file  . Years of education: Not on file  . Highest education level: Not on file  Occupational History  . Occupation: homemaker  Social Needs  . Financial resource strain: Not on file  . Food insecurity    Worry: Not on file    Inability: Not on file  . Transportation needs    Medical: Not on file    Non-medical: Not on file  Tobacco Use  . Smoking status: Never Smoker  . Smokeless tobacco: Never Used  Substance and Sexual Activity  .  Alcohol use: Yes    Comment: very rarely  . Drug use: No  . Sexual activity: Yes    Birth control/protection: Surgical    Comment: hysterectomy  Lifestyle  . Physical activity    Days per week: Not on file    Minutes per session: Not on file  . Stress: Not on file  Relationships  . Social Musician on phone: Not on file    Gets together: Not on file    Attends religious service: Not on file    Active member of club or organization: Not on file    Attends meetings of clubs or organizations: Not on file    Relationship status: Not on file  . Intimate partner violence    Fear of current or ex partner: Not on file    Emotionally abused: Not on file    Physically abused: Not on file    Forced sexual activity: Not on file  Other Topics Concern  . Not on file  Social History Narrative   Married;   5 children, 4 live in [place], 1 lives in [place].   2 grandchildren          Review of Systems  All other systems reviewed and are negative.   PHYSICAL EXAMINATION:    There were no vitals taken for this visit.    General appearance: alert, cooperative and appears stated age   ASSESSMENT  Menopausal symptoms improved on Effexor 75 mg daily.  Hip pain. Off Gabapentin.   PLAN  We reviewed potential side effects of Effexor XR.  Refill of Effexor 75 mg daily until annual exam is due.  She will start taking at hs.  I encouraged her to contact her orthopedist regarding her continued hip pain.    An After Visit Summary was printed and given to the patient.  __10____ minutes face to face time.

## 2019-04-06 ENCOUNTER — Ambulatory Visit (INDEPENDENT_AMBULATORY_CARE_PROVIDER_SITE_OTHER): Payer: PRIVATE HEALTH INSURANCE | Admitting: Family Medicine

## 2019-04-06 ENCOUNTER — Encounter: Payer: Self-pay | Admitting: Family Medicine

## 2019-04-06 ENCOUNTER — Other Ambulatory Visit: Payer: Self-pay

## 2019-04-06 VITALS — BP 135/79 | HR 73 | Temp 98.5°F | Wt 193.4 lb

## 2019-04-06 DIAGNOSIS — R0683 Snoring: Secondary | ICD-10-CM | POA: Diagnosis not present

## 2019-04-06 DIAGNOSIS — R400 Somnolence: Secondary | ICD-10-CM | POA: Diagnosis not present

## 2019-04-06 DIAGNOSIS — K5730 Diverticulosis of large intestine without perforation or abscess without bleeding: Secondary | ICD-10-CM

## 2019-04-06 DIAGNOSIS — G4700 Insomnia, unspecified: Secondary | ICD-10-CM | POA: Diagnosis not present

## 2019-04-06 NOTE — Patient Instructions (Addendum)
I will refer you to sleep specialist to evaluate for possible sleep apnea.  Let me know if you would like a referral to another gastroenterologist or colorectal surgeon to discuss the diverticulosis and treatment options.  Thanks for coming in today.   Diverticulosis  Diverticulosis is a condition that develops when small pouches (diverticula) form in the wall of the large intestine (colon). The colon is where water is absorbed and stool is formed. The pouches form when the inside layer of the colon pushes through weak spots in the outer layers of the colon. You may have a few pouches or many of them. What are the causes? The cause of this condition is not known. What increases the risk? The following factors may make you more likely to develop this condition:  Being older than age 27. Your risk for this condition increases with age. Diverticulosis is rare among people younger than age 65. By age 73, many people have it.  Eating a low-fiber diet.  Having frequent constipation.  Being overweight.  Not getting enough exercise.  Smoking.  Taking over-the-counter pain medicines, like aspirin and ibuprofen.  Having a family history of diverticulosis. What are the signs or symptoms? In most people, there are no symptoms of this condition. If you do have symptoms, they may include:  Bloating.  Cramps in the abdomen.  Constipation or diarrhea.  Pain in the lower left side of the abdomen. How is this diagnosed? This condition is most often diagnosed during an exam for other colon problems. Because diverticulosis usually has no symptoms, it often cannot be diagnosed independently. This condition may be diagnosed by:  Using a flexible scope to examine the colon (colonoscopy).  Taking an X-ray of the colon after dye has been put into the colon (barium enema).  Doing a CT scan. How is this treated? You may not need treatment for this condition if you have never developed an  infection related to diverticulosis. If you have had an infection before, treatment may include:  Eating a high-fiber diet. This may include eating more fruits, vegetables, and grains.  Taking a fiber supplement.  Taking a live bacteria supplement (probiotic).  Taking medicine to relax your colon.  Taking antibiotic medicines. Follow these instructions at home:  Drink 6-8 glasses of water or more each day to prevent constipation.  Try not to strain when you have a bowel movement.  If you have had an infection before: ? Eat more fiber as directed by your health care provider or your diet and nutrition specialist (dietitian). ? Take a fiber supplement or probiotic, if your health care provider approves.  Take over-the-counter and prescription medicines only as told by your health care provider.  If you were prescribed an antibiotic, take it as told by your health care provider. Do not stop taking the antibiotic even if you start to feel better.  Keep all follow-up visits as told by your health care provider. This is important. Contact a health care provider if:  You have pain in your abdomen.  You have bloating.  You have cramps.  You have not had a bowel movement in 3 days. Get help right away if:  Your pain gets worse.  Your bloating becomes very bad.  You have a fever or chills, and your symptoms suddenly get worse.  You vomit.  You have bowel movements that are bloody or black.  You have bleeding from your rectum. Summary  Diverticulosis is a condition that develops when small pouches (  diverticula) form in the wall of the large intestine (colon).  You may have a few pouches or many of them.  This condition is most often diagnosed during an exam for other colon problems.  If you have had an infection related to diverticulosis, treatment may include increasing the fiber in your diet, taking supplements, or taking medicines. This information is not intended to  replace advice given to you by your health care provider. Make sure you discuss any questions you have with your health care provider. Document Released: 01/29/2004 Document Revised: 04/15/2017 Document Reviewed: 03/22/2016 Elsevier Patient Education  [employer].  If you have lab work done today you will be contacted with your lab results within the next 2 weeks.  If you have not heard from [place] then please contact us. The fastest way to get your results is to register for My Chart.   IF you received an x-ray today, you will receive an invoice from [facility]. Please contact [facility] at [phone] with questions or concerns regarding your invoice.   IF you received labwork today, you will receive an invoice from [facility]. Please contact [facility] at [phone] with questions or concerns regarding your invoice.   Our billing staff will not be able to assist you with questions regarding bills from these companies.  You will be contacted with the lab results as soon as they are available. The fastest way to get your results is to activate your My Chart account. Instructions are located on the last page of this paperwork. If you have not heard from [place] regarding the results in 2 weeks, please contact this office.

## 2019-04-06 NOTE — Progress Notes (Signed)
Subjective:  Patient ID: Erika Wolf, female    DOB: 05-28-1955  Age: 63 y.o. MRN: [id number]  CC:  Chief Complaint  Patient presents with  . Follow-up    here today to discuss cdolonocopy results. Result are in Care Everywhere. But I have copied &paste in office on sheet in door  . Insomnia    Been having trouble sleeping for the past 2 years and would like to see what we can do about thisw problem. Possible sleep study    HPI Erika Wolf presents for';  Follow-up of the barium enema.: Procedure performed October 29 through [facility]. GI: Dr Thedore Mins. [place], surgical - colonscopy halted d/t severe diverticulitis.  Chronic changes of remote diverticulitis with narrowing within the sigmoid and to lesser extent descending colon contained extensive colonic diverticulosis.  No evidence of mass, narrowing or polyp. Discussed eventually may need surgery, but not urgent. Plans on waiting on surgery. No abd pain. No fever or blood recently.   Insomnia: Past few years.  Loud snoring. Daytime somnolence. Has woken self up with choking sensation. Has not had testing for OSA.  Wakes up about 6 times per night. No recent changes.  Body mass index is 36.54 kg/m. Wt Readings from Last 3 Encounters:  04/06/19 193 lb 6.4 oz (87.7 kg)  02/07/19 192 lb 3.2 oz (87.2 kg)  12/27/18 191 lb 3.2 oz (86.7 kg)       History Patient Active Problem List   Diagnosis Date Noted  . OA (osteoarthritis) of hip 10/18/2018  . History of hematuria 12/27/2013  . Essential hypertension 12/27/2013  . Surgical menopause on hormone replacement therapy 12/27/2013  . Obesity (BMI 30.0-34.9) 12/27/2013   Past Medical History:  Diagnosis Date  . HTN (hypertension)   . Urinary incontinence    cough, sneeze   Past Surgical History:  Procedure Laterality Date  . CESAREAN SECTION  5 total  . CHOLECYSTECTOMY    . GALLBLADDER SURGERY    . ROTATOR CUFF REPAIR    . TOTAL ABDOMINAL HYSTERECTOMY  2005   Fibroid ovaries and tubes removed also  . TOTAL HIP ARTHROPLASTY Right 10/18/2018   Procedure: TOTAL HIP ARTHROPLASTY ANTERIOR APPROACH;  Surgeon: Ollen Gross, MD;  Location: [facility];  Service: Orthopedics;  Laterality: Right;    No Known Allergies Prior to Admission medications   Medication Sig Start Date End Date Taking? Authorizing Provider  amLODipine (NORVASC) 5 MG tablet Take 1 tablet (5 mg total) by mouth daily. 02/07/19  Yes Shade Flood, MD  cetirizine (ZYRTEC) 10 MG tablet Take 10 mg by mouth daily.   Yes [provider]  fluticasone (KLS ALLER-FLO) 50 MCG/ACT nasal spray Place 1 Dose into the nose daily.   Yes [provider]  hydrochlorothiazide (HYDRODIURIL) 25 MG tablet Take 1 tablet (25 mg total) by mouth daily. 02/07/19  Yes Shade Flood, MD  losartan (COZAAR) 100 MG tablet Take 1 tablet (100 mg total) by mouth daily. 02/07/19  Yes Shade Flood, MD  metoprolol tartrate (LOPRESSOR) 100 MG tablet Take 1 tablet (100 mg total) by mouth daily. 02/07/19  Yes Shade Flood, MD  omeprazole (PRILOSEC) 20 MG capsule Take 1 capsule (20 mg total) by mouth daily. 02/07/19  Yes Shade Flood, MD  venlafaxine XR (EFFEXOR XR) 75 MG 24 hr capsule Take 1 capsule (75 mg total) by mouth daily with breakfast. 03/28/19  Yes Patton Salles, MD   Social History   Socioeconomic History  .  Marital status: Married    Spouse name: Not on file  . Number of children: Not on file  . Years of education: Not on file  . Highest education level: Not on file  Occupational History  . Occupation: homemaker  Social Needs  . Financial resource strain: Not on file  . Food insecurity    Worry: Not on file    Inability: Not on file  . Transportation needs    Medical: Not on file    Non-medical: Not on file  Tobacco Use  . Smoking status: Never Smoker  . Smokeless tobacco: Never Used  Substance and Sexual Activity  . Alcohol use: Yes    Comment: very  rarely  . Drug use: No  . Sexual activity: Yes    Birth control/protection: Surgical    Comment: hysterectomy  Lifestyle  . Physical activity    Days per week: Not on file    Minutes per session: Not on file  . Stress: Not on file  Relationships  . Social Herbalist on phone: Not on file    Gets together: Not on file    Attends religious service: Not on file    Active member of club or organization: Not on file    Attends meetings of clubs or organizations: Not on file    Relationship status: Not on file  . Intimate partner violence    Fear of current or ex partner: Not on file    Emotionally abused: Not on file    Physically abused: Not on file    Forced sexual activity: Not on file  Other Topics Concern  . Not on file  Social History Narrative   Married;   5 children, 4 live in [place], 1 lives in [place].   2 grandchildren          Review of Systems  Per HPI.   Objective:   Vitals:   04/06/19 1500  BP: 135/79  Pulse: 73  Temp: 98.5 F (36.9 C)  TempSrc: Oral  SpO2: 98%  Weight: 193 lb 6.4 oz (87.7 kg)     Physical Exam Vitals signs reviewed.  Constitutional:      General: She is not in acute distress.    Appearance: She is well-developed.  HENT:     Head: Normocephalic and atraumatic.  Cardiovascular:     Rate and Rhythm: Normal rate.  Pulmonary:     Effort: Pulmonary effort is normal.  Abdominal:     General: Abdomen is flat. Bowel sounds are normal. There is no distension.     Tenderness: There is no abdominal tenderness.  Neurological:     Mental Status: She is alert and oriented to person, place, and time.        Assessment & Plan:  Erika Wolf is a 63 y.o. female . Diverticulosis of colon  -Significant diverticulosis without recent symptoms of diverticulitis.  ER/RTC precautions given if those were to occur.  Option of meeting with other gastroenterologist or colorectal surgeon for second opinion to decide on surgery but  deferred at this time.  Snoring - Plan: Ambulatory referral to Sleep Studies Daytime somnolence - Plan: Ambulatory referral to Sleep Studies Insomnia, unspecified type - Plan: Ambulatory referral to Sleep Studies  -Suspicious for obstructive sleep apnea.  Refer to sleep specialist.  No orders of the defined types were placed in this encounter.  Patient Instructions       If you have lab work done today you will  be contacted with your lab results within the next 2 weeks.  If you have not heard from [place] then please contact us. The fastest way to get your results is to register for My Chart.   IF you received an x-ray today, you will receive an invoice from [facility]. Please contact [facility] at [phone] with questions or concerns regarding your invoice.   IF you received labwork today, you will receive an invoice from [facility]. Please contact [facility] at [phone] with questions or concerns regarding your invoice.   Our billing staff will not be able to assist you with questions regarding bills from these companies.  You will be contacted with the lab results as soon as they are available. The fastest way to get your results is to activate your My Chart account. Instructions are located on the last page of this paperwork. If you have not heard from [place] regarding the results in 2 weeks, please contact this office.          Signed, Merri Ray, MD [facility]

## 2019-04-08 ENCOUNTER — Encounter: Payer: Self-pay | Admitting: Family Medicine

## 2019-04-24 ENCOUNTER — Encounter: Payer: Self-pay | Admitting: Neurology

## 2019-04-24 ENCOUNTER — Other Ambulatory Visit: Payer: Self-pay

## 2019-04-24 ENCOUNTER — Ambulatory Visit (INDEPENDENT_AMBULATORY_CARE_PROVIDER_SITE_OTHER): Payer: PRIVATE HEALTH INSURANCE | Admitting: Neurology

## 2019-04-24 VITALS — BP 160/91 | HR 73 | Ht 61.0 in | Wt 193.0 lb

## 2019-04-24 DIAGNOSIS — G2581 Restless legs syndrome: Secondary | ICD-10-CM

## 2019-04-24 DIAGNOSIS — R519 Headache, unspecified: Secondary | ICD-10-CM | POA: Diagnosis not present

## 2019-04-24 DIAGNOSIS — R0681 Apnea, not elsewhere classified: Secondary | ICD-10-CM | POA: Diagnosis not present

## 2019-04-24 DIAGNOSIS — E669 Obesity, unspecified: Secondary | ICD-10-CM | POA: Diagnosis not present

## 2019-04-24 DIAGNOSIS — R0683 Snoring: Secondary | ICD-10-CM

## 2019-04-24 DIAGNOSIS — R351 Nocturia: Secondary | ICD-10-CM

## 2019-04-24 NOTE — Patient Instructions (Signed)

## 2019-04-24 NOTE — Progress Notes (Signed)
Subjective:    Patient ID: Erika Wolf is a 63 y.o. female.  HPI     Erika Foley, MD, PhD [facility] [address], [address] [address] [place], [place] [address]  Dear Dr. Neva Wolf,  I saw your patient, Erika Wolf, upon your kind request, in my sleep clinic today for initial consultation of her sleep disorder, in particular, concern for underlying obstructive sleep apnea.  The patient is unaccompanied today.  As you know, [name] is a 26 year old right-handed woman with an underlying medical history of hypertension, diverticulosis, osteoarthritis and obesity, who reports snoring and sleep disruption from nocturia and witnessed apneas per family report.  I reviewed your office note from 04/06/2019. Her Epworth sleepiness score is 4 out of 24, fatigue severity score is 20 out of 63.  She is married and lives with her spouse, they have 5 children.  She is a non-smoker and drinks alcohol occasionally, drinks caffeine in the form of coffee, about 2 cups/day on average.  One of their daughters live with them currently.  They have no pets in the household, she has a TV in the bedroom but typically does not watch it.  She does have intermittent restless leg symptoms.  Her husband has noted pauses in her breathing and recently during a family vacation her sister-in-law also had made a comment regarding her breathing pauses, her sister-in-law has been a CPAP user and encouraged her to get checked out.  The patient goes to bed around 10, rise time is 7 she does have significant nocturia about 3 times per average night and has woken up occasionally with a dull, achy headache.  She has a prior history of migraines and these headaches are different, not like her migraines.  Sometimes she takes over-the-counter medication such as Excedrin for her morning headaches.  She is not aware of any family history of OSA.  She would be willing to get tested and consider CPAP therapy.  Weight  has increased some.  She is not currently working outside the home but is helping with taking care of her grandchildren.  She has a total of 8 grandchildren.   Her Past Medical History Is Significant For: Past Medical History:  Diagnosis Date  . HTN (hypertension)   . Urinary incontinence    cough, sneeze    Her Past Surgical History Is Significant For: Past Surgical History:  Procedure Laterality Date  . CESAREAN SECTION  5 total  . CHOLECYSTECTOMY    . GALLBLADDER SURGERY    . ROTATOR CUFF REPAIR    . TOTAL ABDOMINAL HYSTERECTOMY  2005   Fibroid ovaries and tubes removed also  . TOTAL HIP ARTHROPLASTY Right 10/18/2018   Procedure: TOTAL HIP ARTHROPLASTY ANTERIOR APPROACH;  Surgeon: Ollen Gross, MD;  Location: [facility];  Service: Orthopedics;  Laterality: Right;     Her Family History Is Significant For: Family History  Problem Relation Age of Onset  . Hyperlipidemia Mother   . Hypertension Mother   . Cancer Father        colon  . Aneurysm Father        brain  . Cancer Sister        ovarian  . Hyperlipidemia Brother   . Breast cancer Daughter     Her Social History Is Significant For: Social History   Socioeconomic History  . Marital status: Married    Spouse name: Not on file  . Number of children: Not on file  . Years  of education: Not on file  . Highest education level: Not on file  Occupational History  . Occupation: homemaker  Social Needs  . Financial resource strain: Not on file  . Food insecurity    Worry: Not on file    Inability: Not on file  . Transportation needs    Medical: Not on file    Non-medical: Not on file  Tobacco Use  . Smoking status: Never Smoker  . Smokeless tobacco: Never Used  Substance and Sexual Activity  . Alcohol use: Yes    Comment: very rarely  . Drug use: No  . Sexual activity: Yes    Birth control/protection: Surgical    Comment: hysterectomy  Lifestyle  . Physical activity    Days per week: Not on file     Minutes per session: Not on file  . Stress: Not on file  Relationships  . Social Herbalist on phone: Not on file    Gets together: Not on file    Attends religious service: Not on file    Active member of club or organization: Not on file    Attends meetings of clubs or organizations: Not on file    Relationship status: Not on file  Other Topics Concern  . Not on file  Social History Narrative   Married;   5 children, 4 live in [place], 1 lives in [place].   2 grandchildren          Her Allergies Are:  No Known Allergies:   Her Current Medications Are:  Outpatient Encounter Medications as of 04/24/2019  Medication Sig  . amLODipine (NORVASC) 5 MG tablet Take 1 tablet (5 mg total) by mouth daily.  . cetirizine (ZYRTEC) 10 MG tablet Take 10 mg by mouth daily.  . fluticasone (KLS ALLER-FLO) 50 MCG/ACT nasal spray Place 1 Dose into the nose daily.  . hydrochlorothiazide (HYDRODIURIL) 25 MG tablet Take 1 tablet (25 mg total) by mouth daily.  Marland Kitchen losartan (COZAAR) 100 MG tablet Take 1 tablet (100 mg total) by mouth daily.  . metoprolol tartrate (LOPRESSOR) 100 MG tablet Take 1 tablet (100 mg total) by mouth daily.  Marland Kitchen omeprazole (PRILOSEC) 20 MG capsule Take 1 capsule (20 mg total) by mouth daily.  Marland Kitchen venlafaxine XR (EFFEXOR XR) 75 MG 24 hr capsule Take 1 capsule (75 mg total) by mouth daily with breakfast.   No facility-administered encounter medications on file as of 04/24/2019.   :  Review of Systems:  Out of a complete 14 point review of systems, all are reviewed and negative with the exception of these symptoms as listed below: Review of Systems  Neurological:       Pt presents today to discuss her sleep. Pt has never had a sleep study but does endorse snoring.  Epworth Sleepiness Scale 0= would never doze 1= slight chance of dozing 2= moderate chance of dozing 3= high chance of dozing  Sitting and reading: 1 Watching TV: 1 Sitting inactive in a public place  (ex. Theater or meeting): 0 As a passenger in a car for an hour without a break: 0 Lying down to rest in the afternoon: 2 Sitting and talking to someone: 0 Sitting quietly after lunch (no alcohol): 0 In a car, while stopped in traffic: 0 Total: 4     Objective:  Neurological Exam  Physical Exam Physical Examination:   Vitals:   04/24/19 1552  BP: (!) 160/91  Pulse: 73    General Examination:  The patient is a very pleasant 63 y.o. female in no acute distress. She appears well-developed and well-nourished and well groomed.   HEENT: Normocephalic, atraumatic, pupils are equal, round and reactive to light, extraocular tracking is good without limitation to gaze excursion or nystagmus noted. Hearing is grossly intact. Face is symmetric with normal facial animation. Speech is clear with no dysarthria noted. There is no hypophonia. There is no lip, neck/head, jaw or voice tremor. Neck is supple with full range of passive and active motion. There are no carotid bruits on auscultation. Oropharynx exam reveals: moderate mouth dryness, adequate dental hygiene and mild airway crowding, due to Small airway entry and slightly longer appearing uvula, Mallampati class II, tonsils 1+ bilaterally, tongue protrudes centrally in palate elevates symmetrically, neck circumference is 15-1/4 inches, she has a minimal overbite.  Chest: Clear to auscultation without wheezing, rhonchi or crackles noted.  Heart: S1+S2+0, regular and normal without murmurs, rubs or gallops noted.   Abdomen: Soft, non-tender and non-distended with normal bowel sounds appreciated on auscultation.  Extremities: There is no pitting edema in the distal lower extremities bilaterally.   Skin: Warm and dry without trophic changes noted.   Musculoskeletal: exam reveals no obvious joint deformities, tenderness or joint swelling or erythema.   Neurologically:  Mental status: The patient is awake, alert and oriented in all 4 spheres.  Her immediate and remote memory, attention, language skills and fund of knowledge are appropriate. There is no evidence of aphasia, agnosia, apraxia or anomia. Speech is clear with normal prosody and enunciation. Thought process is linear. Mood is normal and affect is normal.  Cranial nerves II - XII are as described above under HEENT exam.  Motor exam: Normal bulk, strength and tone is noted. There is no tremor, Romberg is negative. Fine motor skills and coordination: grossly intact.  Cerebellar testing: No dysmetria or intention tremor. There is no truncal or gait ataxia.  Sensory exam: intact to light touch in the upper and lower extremities.  Gait, station and balance: She stands easily. No veering to one side is noted. No leaning to one side is noted. Posture is age-appropriate and stance is narrow based. Gait shows normal stride length and normal pace. No problems turning are noted. Tandem walk is unremarkable.                Assessment and Plan:  In summary, Cailah Face is a very pleasant 63 y.o.-year old female with an underlying medical history of hypertension, diverticulosis, osteoarthritis and obesity, whose history and physical exam are concerning for obstructive sleep apnea (OSA). I had a long chat with the patient about my findings and the diagnosis of OSA, its prognosis and treatment options. We talked about medical treatments, surgical interventions and non-pharmacological approaches. I explained in particular the risks and ramifications of untreated moderate to severe OSA, especially with respect to developing cardiovascular disease down the Road, including congestive heart failure, difficult to treat hypertension, cardiac arrhythmias, or stroke. Even type 2 diabetes has, in part, been linked to untreated OSA. Symptoms of untreated OSA include daytime sleepiness, memory problems, mood irritability and mood disorder such as depression and anxiety, lack of energy, as well as recurrent  headaches, especially morning headaches. We talked about trying to maintain a healthy lifestyle in general, as well as the importance of weight control. We also talked about the importance of good sleep hygiene. I recommended the following at this time: sleep study.   I explained the sleep test procedure to  the patient and also outlined possible surgical and non-surgical treatment options of OSA, including the use of a custom-made dental device (which would require a referral to a specialist dentist or oral surgeon), upper airway surgical options, such as traditional UPPP or a novel less invasive surgical option in the form of Inspire hypoglossal nerve stimulation (which would involve a referral to an ENT surgeon). I also explained the CPAP treatment option to the patient, who indicated that she would be willing to try CPAP if the need arises. I explained the importance of being compliant with PAP treatment, not only for insurance purposes but primarily to improve Her symptoms, and for the patient's long term health benefit, including to reduce Her cardiovascular risks. I answered all her questions today and the patient was in agreement. I plan to see her back after the sleep study is completed and encouraged her to call with any interim questions, concerns, problems or updates.   Thank you very much for allowing me to participate in the care of this nice patient. If I can be of any further assistance to you please do not hesitate to call me at [phone].  Sincerely,   Star Age, MD, PhD

## 2019-04-27 ENCOUNTER — Ambulatory Visit (INDEPENDENT_AMBULATORY_CARE_PROVIDER_SITE_OTHER): Payer: PRIVATE HEALTH INSURANCE | Admitting: Neurology

## 2019-04-27 ENCOUNTER — Other Ambulatory Visit: Payer: Self-pay

## 2019-04-27 DIAGNOSIS — R0683 Snoring: Secondary | ICD-10-CM

## 2019-04-27 DIAGNOSIS — G4733 Obstructive sleep apnea (adult) (pediatric): Secondary | ICD-10-CM | POA: Diagnosis not present

## 2019-04-27 DIAGNOSIS — G4720 Circadian rhythm sleep disorder, unspecified type: Secondary | ICD-10-CM

## 2019-04-27 DIAGNOSIS — G4761 Periodic limb movement disorder: Secondary | ICD-10-CM

## 2019-04-27 DIAGNOSIS — R351 Nocturia: Secondary | ICD-10-CM

## 2019-04-27 DIAGNOSIS — G2581 Restless legs syndrome: Secondary | ICD-10-CM

## 2019-04-27 DIAGNOSIS — E669 Obesity, unspecified: Secondary | ICD-10-CM

## 2019-04-27 DIAGNOSIS — R519 Headache, unspecified: Secondary | ICD-10-CM

## 2019-04-27 DIAGNOSIS — R0681 Apnea, not elsewhere classified: Secondary | ICD-10-CM

## 2019-05-14 NOTE — Progress Notes (Signed)
Patient referred by Dr. Carlota Raspberry, seen by me on 04/24/19, diagnostic PSG on 04/27/19.   Please call and notify the patient that the recent sleep study showed severe obstructive sleep apnea. I recommend treatment for this in the form of CPAP. This will require a repeat sleep study for proper titration and mask fitting and correct monitoring of the oxygen saturations. Please explain to patient. I have placed an order in the chart. Thanks.  Star Age, MD, PhD [facility])

## 2019-05-14 NOTE — Addendum Note (Signed)
Addended by: Star Age on: 05/14/2019 03:31 PM   Modules accepted: Orders

## 2019-05-14 NOTE — Procedures (Signed)
PATIENT'S NAME:  Elloise, Wolf DOB:      1955/11/29      MR#:    [id number]     DATE OF RECORDING: 04/27/2019 REFERRING M.D.:  Merri Ray, MD Study Performed:   Baseline Polysomnogram HISTORY: 63 year old woman with a history of hypertension, diverticulosis, osteoarthritis and obesity, who reports snoring and sleep disruption from nocturia and witnessed apneas per family report. She has intermittent restless leg symptoms. The patient endorsed the Epworth Sleepiness Scale at 4 points. The patient's weight 194 pounds with a height of 61 (inches), resulting in a BMI of 36.6 kg/m2. The patient's neck circumference measured 15.25 inches.  CURRENT MEDICATIONS: Norvasc, Zyrtec, Fluticasone, Hydrodiuril, Cozaar, Lopressor, Prilosec, Effexor XR   PROCEDURE:  This is a multichannel digital polysomnogram utilizing the Somnostar 11.2 system.  Electrodes and sensors were applied and monitored per AASM Specifications.   EEG, EOG, Chin and Limb EMG, were sampled at 200 Hz.  ECG, Snore and Nasal Pressure, Thermal Airflow, Respiratory Effort, CPAP Flow and Pressure, Oximetry was sampled at 50 Hz. Digital video and audio were recorded.      BASELINE STUDY  Lights Out was at 21:48 and Lights On at 04:54.  Total recording time (TRT) was 426.5 minutes, with a total sleep time (TST) of 352 minutes.   The patient's sleep latency was 33.5 minutes, which is delayed. REM latency was 119.5 minutes, which is high normal. The sleep efficiency was 82.5 %.     SLEEP ARCHITECTURE: WASO (Wake after sleep onset) was 45.5 minutes with mild to, at times, moderate sleep fragmentation noted. There were 23 minutes in Stage N1, 230.5 minutes Stage N2, 54.5 minutes Stage N3 and 44 minutes in Stage REM.  The percentage of Stage N1 was 6.5%, Stage N2 was 65.5%, which is increased, Stage N3 was 15.5%, which is normal and Stage R (REM sleep) was 12.5%, which is reduced. The arousals were noted as: 36 were spontaneous, 4 were associated  with PLMs, 157 were associated with respiratory events.  RESPIRATORY ANALYSIS:  There were a total of 235 respiratory events:  9 obstructive apneas, 0 central apneas and 0 mixed apneas with a total of 9 apneas and an apnea index (AI) of 1.5 /hour. There were 226 hypopneas with a hypopnea index of 38.5 /hour. The patient also had 0 respiratory event related arousals (RERAs).      The total APNEA/HYPOPNEA INDEX (AHI) was 40.1/hour and the total RESPIRATORY DISTURBANCE INDEX was  40.1 /hour.  73 events occurred in REM sleep and 324 events in NREM. The REM AHI was  99.5 /hour, versus a non-REM AHI of 31.6. The patient spent 32 minutes of total sleep time in the supine position and 320 minutes in non-supine. The supine AHI was 46.9/h versus a non-supine AHI of 39.4.  OXYGEN SATURATION & C02:  The Wake baseline 02 saturation was 95%, with the lowest being 72%. Time spent below 89% saturation equaled 33 minutes.  PERIODIC LIMB MOVEMENTS: The patient had a total of 156 Periodic Limb Movements.  The Periodic Limb Movement (PLM) index was 26.6 and the PLM Arousal index was .7/hour.  Audio and video analysis did not show any abnormal or unusual movements, behaviors, phonations or vocalizations. The patient took one bathroom break. Mild to moderate snoring was noted. The EKG was in keeping with normal sinus rhythm (NSR).   Post-study, the patient indicated that sleep was the same as usual.   IMPRESSION:  1. Severe Obstructive Sleep Apnea (OSA) 2. Periodic Limb Movement  Disorder (PLMD) 3. Dysfunctions associated with sleep stages or arousal from sleep  RECOMMENDATIONS:  1. This study demonstrates severe obstructive sleep apnea, with a total AHI of 40.1/hour, REM AHI of 99.5/hour, supine AHI of 46.9/hour and O2 nadir of 72%. Treatment with positive airway pressure in the form of CPAP is highly recommended. This will require a full night titration study to optimize therapy. Other treatment options may include  avoidance of supine sleep position along with weight loss, upper airway or jaw surgery in selected patients or the use of an oral appliance in certain patients. ENT evaluation and/or consultation with a maxillofacial surgeon or dentist may be feasible in some instances.    2. Please note that untreated obstructive sleep apnea may carry additional perioperative morbidity. Patients with significant obstructive sleep apnea should receive perioperative PAP therapy and the surgeons and particularly the anesthesiologist should be informed of the diagnosis and the severity of the sleep disordered breathing. 3. Moderate PLMs (periodic limb movements of sleep) were noted during this study with no significant arousals; clinical correlation is recommended. Medication effect from the antidepressant medication should be considered. PLMs may improve with OSA treatment. 4. This study shows sleep fragmentation and abnormal sleep stage percentages; these are nonspecific findings and per se do not signify an intrinsic sleep disorder or a cause for the patient's sleep-related symptoms. Causes include (but are not limited to) the first night effect of the sleep study, circadian rhythm disturbances, medication effect or an underlying mood disorder or medical problem.  5. The patient will be seen in follow-up in the sleep clinic at [facility] for discussion of the test results, symptom and treatment compliance review, further management strategies, etc. The referring provider will be notified of the test results.  I certify that I have reviewed the entire raw data recording prior to the issuance of this report in accordance with the Standards of Accreditation of the American Academy of Sleep Medicine (AASM)  Huston Foley, MD, PhD Diplomat, American Board of Neurology and Sleep Medicine (Neurology and Sleep Medicine)

## 2019-05-15 ENCOUNTER — Encounter: Payer: Self-pay | Admitting: Emergency Medicine

## 2019-05-15 ENCOUNTER — Telehealth: Payer: Self-pay | Admitting: Neurology

## 2019-05-15 ENCOUNTER — Ambulatory Visit (INDEPENDENT_AMBULATORY_CARE_PROVIDER_SITE_OTHER): Payer: PRIVATE HEALTH INSURANCE | Admitting: Emergency Medicine

## 2019-05-15 ENCOUNTER — Other Ambulatory Visit: Payer: Self-pay

## 2019-05-15 VITALS — BP 139/84 | HR 68 | Temp 97.4°F | Resp 16 | Ht 61.0 in | Wt 193.0 lb

## 2019-05-15 DIAGNOSIS — G4733 Obstructive sleep apnea (adult) (pediatric): Secondary | ICD-10-CM | POA: Diagnosis not present

## 2019-05-15 DIAGNOSIS — I10 Essential (primary) hypertension: Secondary | ICD-10-CM

## 2019-05-15 NOTE — Telephone Encounter (Signed)
-----   Message from Star Age, MD sent at 05/14/2019  3:31 PM EST ----- Patient referred by Dr. Carlota Raspberry, seen by me on 04/24/19, diagnostic PSG on 04/27/19.   Please call and notify the patient that the recent sleep study showed severe obstructive sleep apnea. I recommend treatment for this in the form of CPAP. This will require a repeat sleep study for proper titration and mask fitting and correct monitoring of the oxygen saturations. Please explain to patient. I have placed an order in the chart. Thanks.  Star Age, MD, PhD [facility])

## 2019-05-15 NOTE — Patient Instructions (Addendum)
   If you have lab work done today you will be contacted with your lab results within the next 2 weeks.  If you have not heard from us then please contact us. The fastest way to get your results is to register for My Chart.   IF you received an x-ray today, you will receive an invoice from [facility]. Please contact [facility] at [phone] with questions or concerns regarding your invoice.   IF you received labwork today, you will receive an invoice from [facility]. Please contact [facility] at [phone] with questions or concerns regarding your invoice.   Our billing staff will not be able to assist you with questions regarding bills from these companies.  You will be contacted with the lab results as soon as they are available. The fastest way to get your results is to activate your My Chart account. Instructions are located on the last page of this paperwork. If you have not heard from us regarding the results in 2 weeks, please contact this office.     Hypertension, Adult High blood pressure (hypertension) is when the force of blood pumping through the arteries is too strong. The arteries are the blood vessels that carry blood from the heart throughout the body. Hypertension forces the heart to work harder to pump blood and may cause arteries to become narrow or stiff. Untreated or uncontrolled hypertension can cause a heart attack, heart failure, a stroke, kidney disease, and other problems. A blood pressure reading consists of a higher number over a lower number. Ideally, your blood pressure should be below 120/80. The first ("top") number is called the systolic pressure. It is a measure of the pressure in your arteries as your heart beats. The second ("bottom") number is called the diastolic pressure. It is a measure of the pressure in your arteries as the heart relaxes. What are the causes? The exact cause of this condition is not known. There are some conditions  that result in or are related to high blood pressure. What increases the risk? Some risk factors for high blood pressure are under your control. The following factors may make you more likely to develop this condition:  Smoking.  Having type 2 diabetes mellitus, high cholesterol, or both.  Not getting enough exercise or physical activity.  Being overweight.  Having too much fat, sugar, calories, or salt (sodium) in your diet.  Drinking too much alcohol. Some risk factors for high blood pressure may be difficult or impossible to change. Some of these factors include:  Having chronic kidney disease.  Having a family history of high blood pressure.  Age. Risk increases with age.  Race. You may be at higher risk if you are African American.  Gender. Men are at higher risk than women before age 45. After age 65, women are at higher risk than men.  Having obstructive sleep apnea.  Stress. What are the signs or symptoms? High blood pressure may not cause symptoms. Very high blood pressure (hypertensive crisis) may cause:  Headache.  Anxiety.  Shortness of breath.  Nosebleed.  Nausea and vomiting.  Vision changes.  Severe chest pain.  Seizures. How is this diagnosed? This condition is diagnosed by measuring your blood pressure while you are seated, with your arm resting on a flat surface, your legs uncrossed, and your feet flat on the floor. The cuff of the blood pressure monitor will be placed directly against the skin of your upper arm at the level of your heart.   It should be measured at least twice using the same arm. Certain conditions can cause a difference in blood pressure between your right and left arms. Certain factors can cause blood pressure readings to be lower or higher than normal for a short period of time:  When your blood pressure is higher when you are in a health care provider's office than when you are at home, this is called white coat hypertension.  Most people with this condition do not need medicines.  When your blood pressure is higher at home than when you are in a health care provider's office, this is called masked hypertension. Most people with this condition may need medicines to control blood pressure. If you have a high blood pressure reading during one visit or you have normal blood pressure with other risk factors, you may be asked to:  Return on a different day to have your blood pressure checked again.  Monitor your blood pressure at home for 1 week or longer. If you are diagnosed with hypertension, you may have other blood or imaging tests to help your health care provider understand your overall risk for other conditions. How is this treated? This condition is treated by making healthy lifestyle changes, such as eating healthy foods, exercising more, and reducing your alcohol intake. Your health care provider may prescribe medicine if lifestyle changes are not enough to get your blood pressure under control, and if:  Your systolic blood pressure is above 130.  Your diastolic blood pressure is above 80. Your personal target blood pressure may vary depending on your medical conditions, your age, and other factors. Follow these instructions at home: Eating and drinking   Eat a diet that is high in fiber and potassium, and low in sodium, added sugar, and fat. An example eating plan is called the DASH (Dietary Approaches to Stop Hypertension) diet. To eat this way: ? Eat plenty of fresh fruits and vegetables. Try to fill one half of your plate at each meal with fruits and vegetables. ? Eat whole grains, such as whole-wheat pasta, brown rice, or whole-grain bread. Fill about one fourth of your plate with whole grains. ? Eat or drink low-fat dairy products, such as skim milk or low-fat yogurt. ? Avoid fatty cuts of meat, processed or cured meats, and poultry with skin. Fill about one fourth of your plate with lean proteins, such  as fish, chicken without skin, beans, eggs, or tofu. ? Avoid pre-made and processed foods. These tend to be higher in sodium, added sugar, and fat.  Reduce your daily sodium intake. Most people with hypertension should eat less than 1,500 mg of sodium a day.  Do not drink alcohol if: ? Your health care provider tells you not to drink. ? You are pregnant, may be pregnant, or are planning to become pregnant.  If you drink alcohol: ? Limit how much you use to:  0-1 drink a day for women.  0-2 drinks a day for men. ? Be aware of how much alcohol is in your drink. In the [place] one drink equals one 12 oz bottle of beer (355 mL), one 5 oz glass of wine (148 mL), or one 1 oz glass of hard liquor (44 mL). Lifestyle   Work with your health care provider to maintain a healthy body weight or to lose weight. Ask what an ideal weight is for you.  Get at least 30 minutes of exercise most days of the week. Activities may include walking, swimming, or   biking.  Include exercise to strengthen your muscles (resistance exercise), such as Pilates or lifting weights, as part of your weekly exercise routine. Try to do these types of exercises for 30 minutes at least 3 days a week.  Do not use any products that contain nicotine or tobacco, such as cigarettes, e-cigarettes, and chewing tobacco. If you need help quitting, ask your health care provider.  Monitor your blood pressure at home as told by your health care provider.  Keep all follow-up visits as told by your health care provider. This is important. Medicines  Take over-the-counter and prescription medicines only as told by your health care provider. Follow directions carefully. Blood pressure medicines must be taken as prescribed.  Do not skip doses of blood pressure medicine. Doing this puts you at risk for problems and can make the medicine less effective.  Ask your health care provider about side effects or reactions to medicines that you  should watch for. Contact a health care provider if you:  Think you are having a reaction to a medicine you are taking.  Have headaches that keep coming back (recurring).  Feel dizzy.  Have swelling in your ankles.  Have trouble with your vision. Get help right away if you:  Develop a severe headache or confusion.  Have unusual weakness or numbness.  Feel faint.  Have severe pain in your chest or abdomen.  Vomit repeatedly.  Have trouble breathing. Summary  Hypertension is when the force of blood pumping through your arteries is too strong. If this condition is not controlled, it may put you at risk for serious complications.  Your personal target blood pressure may vary depending on your medical conditions, your age, and other factors. For most people, a normal blood pressure is less than 120/80.  Hypertension is treated with lifestyle changes, medicines, or a combination of both. Lifestyle changes include losing weight, eating a healthy, low-sodium diet, exercising more, and limiting alcohol. This information is not intended to replace advice given to you by your health care provider. Make sure you discuss any questions you have with your health care provider. Document Released: 05/03/2005 Document Revised: 01/11/2018 Document Reviewed: 01/11/2018 Elsevier Patient Education  2020 [employer]  

## 2019-05-15 NOTE — Telephone Encounter (Signed)
I called pt. I advised pt that Dr. Rexene Alberts reviewed their sleep study results and found that severe sleep apnea and recommends that pt be treated with a cpap. Dr. Rexene Alberts recommends that pt return for a repeat sleep study in order to properly titrate the cpap and ensure a good mask fit. Pt is agreeable to returning for a titration study. I advised pt that our sleep lab will file with pt's insurance and call pt to schedule the sleep study when we hear back from the pt's insurance regarding coverage of this sleep study. Pt verbalized understanding of results. Pt had no questions at this time but was encouraged to call back if questions arise.

## 2019-05-15 NOTE — Progress Notes (Signed)
Instructions Erika Wolf, Erika Lisa, NP - 05/14/2019  You were seen here today for headache and shortness of breath. Your blood pressure is elevated but came back down to normal without intervention. Blood pressure may have been a little more elevated than usual because of your headache. We did an extensive work-up and found no heart, lung, or brain abnormalities. Your COVID test was also negative. You should continue to rest, take Excedrin as needed for headaches at home, continue your blood pressure medications as prescribed, and follow-up with your primary care provider.  Erika Wolf 63 y.o.   Chief Complaint  Patient presents with  . Hypertension    pt states she went to the hospital on 05/12/2019 due to high BP. pt felt nausus, light headed, and fant.     HISTORY OF PRESENT ILLNESS: This is a 63 y.o. female here for follow-up ED visit on 05/13/2019 when she presented with complaints of headache, shortness of breath, palpitations, and elevated blood pressure.  Patient of Dr. Neva SeatGreene, first visit with me. It all started last Friday, 4 days ago, with a watch alert showing tachycardia of 130s.  Blood pressure elevated at the time and remained elevated the next 48 hours while having a headache at the same time. Work-up in the emergency room included blood work with troponins, chest x-ray, EKG, CT of brain and were all within normal limits. Patient was given Toradol for headache and blood pressure normalized on its own. Here for follow-up today.  No complaints, feeling better. ER records reviewed with patient. Denies increased amount of stress.  Drinks a glass of wine every now and then.  No smoking. Recently diagnosed with sleep apnea.  In the process of getting treatment with CPAP but has not started yet. Has been taking Excedrin and also coffee and chocolate so caffeine excessive intake is a concern.  HPI   Prior to Admission medications   Medication Sig Start Date End Date Taking?  Authorizing Provider  amLODipine (NORVASC) 5 MG tablet Take 1 tablet (5 mg total) by mouth daily. 02/07/19  Yes Shade FloodGreene, Jeffrey R, MD  cetirizine (ZYRTEC) 10 MG tablet Take 10 mg by mouth daily.   Yes [provider]  fluticasone (KLS ALLER-FLO) 50 MCG/ACT nasal spray Place 1 Dose into the nose daily.   Yes [provider]  hydrochlorothiazide (HYDRODIURIL) 25 MG tablet Take 1 tablet (25 mg total) by mouth daily. 02/07/19  Yes Shade FloodGreene, Jeffrey R, MD  losartan (COZAAR) 100 MG tablet Take 1 tablet (100 mg total) by mouth daily. 02/07/19  Yes Shade FloodGreene, Jeffrey R, MD  metoprolol tartrate (LOPRESSOR) 100 MG tablet Take 1 tablet (100 mg total) by mouth daily. 02/07/19  Yes Shade FloodGreene, Jeffrey R, MD  omeprazole (PRILOSEC) 20 MG capsule Take 1 capsule (20 mg total) by mouth daily. 02/07/19  Yes Shade FloodGreene, Jeffrey R, MD  venlafaxine XR (EFFEXOR XR) 75 MG 24 hr capsule Take 1 capsule (75 mg total) by mouth daily with breakfast. 03/28/19  Yes Patton SallesAmundson C Silva, Brook E, MD    No Known Allergies  Patient Active Problem List   Diagnosis Date Noted  . OA (osteoarthritis) of hip 10/18/2018  . History of hematuria 12/27/2013  . Essential hypertension 12/27/2013  . Surgical menopause on hormone replacement therapy 12/27/2013  . Obesity (BMI 30.0-34.9) 12/27/2013    Past Medical History:  Diagnosis Date  . HTN (hypertension)   . Urinary incontinence    cough, sneeze    Past Surgical History:  Procedure Laterality Date  .  CESAREAN SECTION  5 total  . CHOLECYSTECTOMY    . GALLBLADDER SURGERY    . ROTATOR CUFF REPAIR    . TOTAL ABDOMINAL HYSTERECTOMY  2005   Fibroid ovaries and tubes removed also  . TOTAL HIP ARTHROPLASTY Right 10/18/2018   Procedure: TOTAL HIP ARTHROPLASTY ANTERIOR APPROACH;  Surgeon: Ollen Gross, MD;  Location: [facility];  Service: Orthopedics;  Laterality: Right;     Social History   Socioeconomic History  . Marital status: Married    Spouse name: Not on file  .  Number of children: Not on file  . Years of education: Not on file  . Highest education level: Not on file  Occupational History  . Occupation: homemaker  Tobacco Use  . Smoking status: Never Smoker  . Smokeless tobacco: Never Used  Substance and Sexual Activity  . Alcohol use: Yes    Comment: very rarely  . Drug use: No  . Sexual activity: Yes    Birth control/protection: Surgical    Comment: hysterectomy  Other Topics Concern  . Not on file  Social History Narrative   Married;   5 children, 4 live in [place], 1 lives in [place].   2 grandchildren         Social Determinants of Health   Financial Resource Strain:   . Difficulty of Paying Living Expenses: Not on file  Food Insecurity:   . Worried About Programme researcher, broadcasting/film/video in the Last Year: Not on file  . Ran Out of Food in the Last Year: Not on file  Transportation Needs:   . Lack of Transportation (Medical): Not on file  . Lack of Transportation (Non-Medical): Not on file  Physical Activity:   . Days of Exercise per Week: Not on file  . Minutes of Exercise per Session: Not on file  Stress:   . Feeling of Stress : Not on file  Social Connections:   . Frequency of Communication with Friends and Family: Not on file  . Frequency of Social Gatherings with Friends and Family: Not on file  . Attends Religious Services: Not on file  . Active Member of Clubs or Organizations: Not on file  . Attends Banker Meetings: Not on file  . Marital Status: Not on file  Intimate Partner Violence:   . Fear of Current or Ex-Partner: Not on file  . Emotionally Abused: Not on file  . Physically Abused: Not on file  . Sexually Abused: Not on file    Family History  Problem Relation Age of Onset  . Hyperlipidemia Mother   . Hypertension Mother   . Cancer Father        colon  . Aneurysm Father        brain  . Cancer Sister        ovarian  . Hyperlipidemia Brother   . Breast cancer Daughter      Review of Systems    Constitutional: Negative.  Negative for chills and fever.  HENT: Negative.  Negative for congestion and sore throat.   Respiratory: Negative.  Negative for cough and shortness of breath.   Cardiovascular: Negative.  Negative for chest pain and palpitations.  Gastrointestinal: Negative.  Negative for abdominal pain, diarrhea, nausea and vomiting.  Genitourinary: Negative.   Musculoskeletal: Negative.  Negative for myalgias and neck pain.  Skin: Negative.  Negative for rash.  Neurological: Negative.  Negative for dizziness and headaches.  All other systems reviewed and are negative.    Today's  Vitals   05/15/19 0805  BP: 139/84  Pulse: 68  Resp: 16  Temp: (!) 97.4 F (36.3 C)  TempSrc: Oral  SpO2: 96%  Weight: 193 lb (87.5 kg)  Height:  (1.549 m)   Body mass index is 36.47 kg/m.   Physical Exam Vitals reviewed.  Constitutional:      Appearance: Normal appearance.  HENT:     Head: Normocephalic.  Eyes:     Extraocular Movements: Extraocular movements intact.     Conjunctiva/sclera: Conjunctivae normal.     Pupils: Pupils are equal, round, and reactive to light.  Cardiovascular:     Rate and Rhythm: Normal rate and regular rhythm.     Pulses: Normal pulses.     Heart sounds: Normal heart sounds.  Pulmonary:     Effort: Pulmonary effort is normal.     Breath sounds: Normal breath sounds.  Musculoskeletal:        General: Normal range of motion.     Cervical back: Normal range of motion and neck supple.  Skin:    General: Skin is warm and dry.  Neurological:     General: No focal deficit present.     Mental Status: She is alert and oriented to person, place, and time.  Psychiatric:        Mood and Affect: Mood normal.        Behavior: Behavior normal.     A total of 25 minutes was spent in the room with the patient, greater than 50% of which was in counseling/coordination of care regarding review of ED visit on 05/13/2019, review of most recent blood work,  EKG, x-ray, brain CT, hypertension and possible elevation triggers, diet and nutrition, need to get started on sleep apnea treatment with CPAP, prognosis and need for follow-up with her PCP Dr. Neva Seat in the next 2 to 3 weeks.   ASSESSMENT & PLAN:  Clinically stable.  No medical concerns identified during this visit.  Continue present medications.  No changes.  Follow-up with Dr. Neva Seat in the next 2 to 3 weeks.  Shenita was seen today for hypertension.  Diagnoses and all orders for this visit:  Essential hypertension  Obstructive sleep apnea    Patient Instructions       If you have lab work done today you will be contacted with your lab results within the next 2 weeks.  If you have not heard from [place] then please contact us. The fastest way to get your results is to register for My Chart.   IF you received an x-ray today, you will receive an invoice from [facility]. Please contact [facility] at [phone] with questions or concerns regarding your invoice.   IF you received labwork today, you will receive an invoice from [facility]. Please contact [facility] at [phone] with questions or concerns regarding your invoice.   Our billing staff will not be able to assist you with questions regarding bills from these companies.  You will be contacted with the lab results as soon as they are available. The fastest way to get your results is to activate your My Chart account. Instructions are located on the last page of this paperwork. If you have not heard from [place] regarding the results in 2 weeks, please contact this office.     Hypertension, Adult High blood pressure (hypertension) is when the force of blood pumping through the arteries is too strong. The arteries are the blood vessels that carry blood from the heart throughout  the body. Hypertension forces the heart to work harder to pump blood and may cause arteries to become narrow or stiff. Untreated or  uncontrolled hypertension can cause a heart attack, heart failure, a stroke, kidney disease, and other problems. A blood pressure reading consists of a higher number over a lower number. Ideally, your blood pressure should be below 120/80. The first ("top") number is called the systolic pressure. It is a measure of the pressure in your arteries as your heart beats. The second ("bottom") number is called the diastolic pressure. It is a measure of the pressure in your arteries as the heart relaxes. What are the causes? The exact cause of this condition is not known. There are some conditions that result in or are related to high blood pressure. What increases the risk? Some risk factors for high blood pressure are under your control. The following factors may make you more likely to develop this condition:  Smoking.  Having type 2 diabetes mellitus, high cholesterol, or both.  Not getting enough exercise or physical activity.  Being overweight.  Having too much fat, sugar, calories, or salt (sodium) in your diet.  Drinking too much alcohol. Some risk factors for high blood pressure may be difficult or impossible to change. Some of these factors include:  Having chronic kidney disease.  Having a family history of high blood pressure.  Age. Risk increases with age.  Race. You may be at higher risk if you are African American.  Gender. Men are at higher risk than women before age 38. After age 76, women are at higher risk than men.  Having obstructive sleep apnea.  Stress. What are the signs or symptoms? High blood pressure may not cause symptoms. Very high blood pressure (hypertensive crisis) may cause:  Headache.  Anxiety.  Shortness of breath.  Nosebleed.  Nausea and vomiting.  Vision changes.  Severe chest pain.  Seizures. How is this diagnosed? This condition is diagnosed by measuring your blood pressure while you are seated, with your arm resting on a flat  surface, your legs uncrossed, and your feet flat on the floor. The cuff of the blood pressure monitor will be placed directly against the skin of your upper arm at the level of your heart. It should be measured at least twice using the same arm. Certain conditions can cause a difference in blood pressure between your right and left arms. Certain factors can cause blood pressure readings to be lower or higher than normal for a short period of time:  When your blood pressure is higher when you are in a health care provider's office than when you are at home, this is called white coat hypertension. Most people with this condition do not need medicines.  When your blood pressure is higher at home than when you are in a health care provider's office, this is called masked hypertension. Most people with this condition may need medicines to control blood pressure. If you have a high blood pressure reading during one visit or you have normal blood pressure with other risk factors, you may be asked to:  Return on a different day to have your blood pressure checked again.  Monitor your blood pressure at home for 1 week or longer. If you are diagnosed with hypertension, you may have other blood or imaging tests to help your health care provider understand your overall risk for other conditions. How is this treated? This condition is treated by making healthy lifestyle changes, such as eating  healthy foods, exercising more, and reducing your alcohol intake. Your health care provider may prescribe medicine if lifestyle changes are not enough to get your blood pressure under control, and if:  Your systolic blood pressure is above 130.  Your diastolic blood pressure is above 80. Your personal target blood pressure may vary depending on your medical conditions, your age, and other factors. Follow these instructions at home: Eating and drinking   Eat a diet that is high in fiber and potassium, and low in  sodium, added sugar, and fat. An example eating plan is called the DASH (Dietary Approaches to Stop Hypertension) diet. To eat this way: ? Eat plenty of fresh fruits and vegetables. Try to fill one half of your plate at each meal with fruits and vegetables. ? Eat whole grains, such as whole-wheat pasta, brown rice, or whole-grain bread. Fill about one fourth of your plate with whole grains. ? Eat or drink low-fat dairy products, such as skim milk or low-fat yogurt. ? Avoid fatty cuts of meat, processed or cured meats, and poultry with skin. Fill about one fourth of your plate with lean proteins, such as fish, chicken without skin, beans, eggs, or tofu. ? Avoid pre-made and processed foods. These tend to be higher in sodium, added sugar, and fat.  Reduce your daily sodium intake. Most people with hypertension should eat less than 1,500 mg of sodium a day.  Do not drink alcohol if: ? Your health care provider tells you not to drink. ? You are pregnant, may be pregnant, or are planning to become pregnant.  If you drink alcohol: ? Limit how much you use to:  0-1 drink a day for women.  0-2 drinks a day for men. ? Be aware of how much alcohol is in your drink. In the [place] one drink equals one 12 oz bottle of beer (355 mL), one 5 oz glass of wine (148 mL), or one 1 oz glass of hard liquor (44 mL). Lifestyle   Work with your health care provider to maintain a healthy body weight or to lose weight. Ask what an ideal weight is for you.  Get at least 30 minutes of exercise most days of the week. Activities may include walking, swimming, or biking.  Include exercise to strengthen your muscles (resistance exercise), such as Pilates or lifting weights, as part of your weekly exercise routine. Try to do these types of exercises for 30 minutes at least 3 days a week.  Do not use any products that contain nicotine or tobacco, such as cigarettes, e-cigarettes, and chewing tobacco. If you need help  quitting, ask your health care provider.  Monitor your blood pressure at home as told by your health care provider.  Keep all follow-up visits as told by your health care provider. This is important. Medicines  Take over-the-counter and prescription medicines only as told by your health care provider. Follow directions carefully. Blood pressure medicines must be taken as prescribed.  Do not skip doses of blood pressure medicine. Doing this puts you at risk for problems and can make the medicine less effective.  Ask your health care provider about side effects or reactions to medicines that you should watch for. Contact a health care provider if you:  Think you are having a reaction to a medicine you are taking.  Have headaches that keep coming back (recurring).  Feel dizzy.  Have swelling in your ankles.  Have trouble with your vision. Get help right away if you:  Develop a severe headache or confusion.  Have unusual weakness or numbness.  Feel faint.  Have severe pain in your chest or abdomen.  Vomit repeatedly.  Have trouble breathing. Summary  Hypertension is when the force of blood pumping through your arteries is too strong. If this condition is not controlled, it may put you at risk for serious complications.  Your personal target blood pressure may vary depending on your medical conditions, your age, and other factors. For most people, a normal blood pressure is less than 120/80.  Hypertension is treated with lifestyle changes, medicines, or a combination of both. Lifestyle changes include losing weight, eating a healthy, low-sodium diet, exercising more, and limiting alcohol. This information is not intended to replace advice given to you by your health care provider. Make sure you discuss any questions you have with your health care provider. Document Released: 05/03/2005 Document Revised: 01/11/2018 Document Reviewed: 01/11/2018 Elsevier Patient Education  2020  Elsevier Inc.      Edwina Barth, MD [facility]

## 2019-06-11 ENCOUNTER — Other Ambulatory Visit (HOSPITAL_COMMUNITY)
Admission: RE | Admit: 2019-06-11 | Discharge: 2019-06-11 | Disposition: A | Discharge status: Home / Self Care | Payer: PRIVATE HEALTH INSURANCE | Source: Ambulatory Visit | Attending: Neurology | Admitting: Neurology

## 2019-06-11 DIAGNOSIS — Z20822 Contact with and (suspected) exposure to covid-19: Secondary | ICD-10-CM | POA: Diagnosis not present

## 2019-06-11 DIAGNOSIS — Z01812 Encounter for preprocedural laboratory examination: Secondary | ICD-10-CM | POA: Diagnosis present

## 2019-06-11 LAB — SARS CORONAVIRUS 2 (TAT 6-24 HRS): SARS Coronavirus 2: NEGATIVE

## 2019-06-13 ENCOUNTER — Ambulatory Visit (INDEPENDENT_AMBULATORY_CARE_PROVIDER_SITE_OTHER): Payer: PRIVATE HEALTH INSURANCE | Admitting: Neurology

## 2019-06-13 ENCOUNTER — Other Ambulatory Visit: Payer: Self-pay

## 2019-06-13 DIAGNOSIS — G4720 Circadian rhythm sleep disorder, unspecified type: Secondary | ICD-10-CM

## 2019-06-13 DIAGNOSIS — R519 Headache, unspecified: Secondary | ICD-10-CM

## 2019-06-13 DIAGNOSIS — G4733 Obstructive sleep apnea (adult) (pediatric): Secondary | ICD-10-CM

## 2019-06-13 DIAGNOSIS — R351 Nocturia: Secondary | ICD-10-CM

## 2019-06-13 DIAGNOSIS — E669 Obesity, unspecified: Secondary | ICD-10-CM

## 2019-06-13 DIAGNOSIS — G4761 Periodic limb movement disorder: Secondary | ICD-10-CM

## 2019-06-13 DIAGNOSIS — G2581 Restless legs syndrome: Secondary | ICD-10-CM

## 2019-06-21 NOTE — Addendum Note (Signed)
Addended by: Huston Foley on: 06/21/2019 08:46 AM   Modules accepted: Orders

## 2019-06-21 NOTE — Procedures (Signed)
S PATIENT'S NAME:  Erika Wolf, Erika Wolf DOB:      1956/03/22      MR#:    [id number]     DATE OF RECORDING: 06/13/2019 REFERRING M.D.:  Merri Ray MD Study Performed:   CPAP  Titration HISTORY: 64 year old woman with a history of hypertension, diverticulosis, osteoarthritis and obesity, who presents for a full night titration to treat her OSA. Her baseline sleep study from 04/27/19 showed severe OSA with an AHI of 40.1/hour, REM AHI of 99.5/hour, supine AHI of 46.9/h and O2 nadir of 72%. The patient endorsed the Epworth Sleepiness Scale at 4 points. The patient's weight 194 pounds with a height of 61 (inches), resulting in a BMI of 36.6 kg/m2. The patient's neck circumference measured 15.25 inches.  CURRENT MEDICATIONS: Norvasc, Zyrtec, Fluticasone, Hydrodiuril, Cozaar, Lopressor, Prilosec, Effexor XR   PROCEDURE:  This is a multichannel digital polysomnogram utilizing the SomnoStar 11.2 system.  Electrodes and sensors were applied and monitored per AASM Specifications.   EEG, EOG, Chin and Limb EMG, were sampled at 200 Hz.  ECG, Snore and Nasal Pressure, Thermal Airflow, Respiratory Effort, CPAP Flow and Pressure, Oximetry was sampled at 50 Hz. Digital video and audio were recorded.      The patient was fitted with a medium N20 nasal. CPAP was initiated at 5 cmH20 with heated humidity per AASM standards and pressure was advanced to 13 cmH20 because of hypopneas, apneas and desaturations.  At a PAP pressure of 13 cmH20, there was a reduction of the AHI to 0/hour, with supine REM sleep achieved and O2 nadir of 89%.  Lights Out was at 22:19 and Lights On at 04:27. Total recording time (TRT) was 369 minutes, with a total sleep time (TST) of 315.5 minutes. The patient's sleep latency was 37.5 minutes. REM latency was 87.5 minutes, which is normal. The sleep efficiency was 85.5 %.    SLEEP ARCHITECTURE: WASO (Wake after sleep onset) was 19 minutes with mild sleep fragmentation noted. There were 14 minutes in  Stage N1, 147 minutes Stage N2, 54.5 minutes Stage N3 and 100 minutes in Stage REM.  The percentage of Stage N1 was 4.4%, Stage N2 was 46.6%, which is normal, Stage N3 was 17.3%, which is normal, and Stage R (REM sleep) was 31.7%, which increased and in keeping with rebound. The arousals were noted as: 22 were spontaneous, 0 were associated with PLMs, 4 were associated with respiratory events.  RESPIRATORY ANALYSIS: There was a total of 22 respiratory events: 0 obstructive apneas, 0 central apneas and 0 mixed apneas with a total of 0 apneas and an apnea index (AI) of 0 /hour. There were 22 hypopneas with a hypopnea index of 4.2/hour. The patient also had 0 respiratory event related arousals (RERAs).      The total APNEA/HYPOPNEA INDEX  (AHI) was 4.2 /hour and the total RESPIRATORY DISTURBANCE INDEX was 4.2 /hour  8 events occurred in REM sleep and 14 events in NREM. The REM AHI was 4.8 /hour versus a non-REM AHI of 3.9 /hour.  The patient spent 43.5 minutes of total sleep time in the supine position and 272 minutes in non-supine. The supine AHI was 4.1, versus a non-supine AHI of 4.2.  OXYGEN SATURATION & C02:  The baseline 02 saturation was 96%, with the lowest being 86%. Time spent below 89% saturation equaled 2 minutes.  PERIODIC LIMB MOVEMENTS:  The patient had a total of 0 Periodic Limb Movements. The Periodic Limb Movement (PLM) index was 0 and the PLM Arousal  index was 0 /hour.  Audio and video analysis did not show any abnormal or unusual movements, behaviors, phonations or vocalizations. The patient took 1 bathroom break. The EKG was in keeping with normal sinus rhythm (NSR).  Post-study, the patient indicated that sleep was the same as usual.   IMPRESSION:   1. Severe Obstructive Sleep Apnea (OSA) 2. Dysfunctions associated with sleep stages or arousal from sleep   RECOMMENDATIONS:   1. This study demonstrates resolution of the patient's obstructive sleep apnea with CPAP therapy. I  will, therefore, start the patient on home CPAP treatment at a pressure of 13 cm via medium N20 nasal mask with heated humidity. The patient should be reminded to be fully compliant with PAP therapy to improve sleep related symptoms and decrease long term cardiovascular risks. The patient should be reminded, that it may take up to 3 months to get fully used to using PAP with all planned sleep. The earlier full compliance is achieved, the better long term compliance tends to be. Please note that untreated obstructive sleep apnea may carry additional perioperative morbidity. Patients with significant obstructive sleep apnea should receive perioperative PAP therapy and the surgeons and particularly the anesthesiologist should be informed of the diagnosis and the severity of the sleep disordered breathing. 2. No significant PLMs were noted during this study.  3. This study shows mild sleep fragmentation and mildly abnormal sleep stage percentages; these are nonspecific findings and per se do not signify an intrinsic sleep disorder or a cause for the patient's sleep-related symptoms. Causes include (but are not limited to) the first night effect of the sleep study, circadian rhythm disturbances, medication effect or an underlying mood disorder or medical problem.  4. The patient will be seen in follow-up in the sleep clinic at [facility] for discussion of the test results, symptom and treatment compliance review, further management strategies, etc. The referring provider will be notified of the test results.   I certify that I have reviewed the entire raw data recording prior to the issuance of this report in accordance with the Standards of Accreditation of the American Academy of Sleep Medicine (AASM)   Huston Foley, MD, PhD Diplomat, American Board of Neurology and Sleep Medicine (Neurology and Sleep Medicine)

## 2019-06-21 NOTE — Progress Notes (Signed)
Patient referred by Dr. Neva Seat, seen by me on 04/24/19, diagnostic PSG on 04/27/19. Patient had a CPAP titration study on 06/13/19.  Please call and inform patient that I have entered an order for treatment with positive airway pressure (PAP) treatment for obstructive sleep apnea (OSA). She did well during the latest sleep study with PAP. We will, therefore, arrange for a machine for home use through a DME (durable medical equipment) company of Her choice; and I will see the patient back in follow-up in about 10 weeks. Please also explain to the patient that I will be looking out for compliance data, which can be downloaded from the machine (stored on an SD card, that is inserted in the machine) or via remote access through a modem, that is built into the machine. At the time of the followup appointment we will discuss sleep study results and how it is going with PAP treatment at home. Please advise patient to bring Her machine at the time of the first FU visit, even though this is cumbersome. Bringing the machine for every visit after that will likely not be needed, but often helps for the first visit to troubleshoot if needed. Please re-enforce the importance of compliance with treatment and the need for [place] to monitor compliance data - often an insurance requirement and actually good feedback for the patient as far as how they are doing.  Also remind patient, that any interim PAP machine or mask issues should be first addressed with the DME company, as they can often help better with technical and mask fit issues. Please ask if patient has a preference regarding DME company.  Please also make sure, the patient has a follow-up appointment with me in about 10 weeks from the setup date, thanks. May see one of our nurse practitioners if needed for proper timing of the FU appointment.  Please fax or rout report to the referring provider. Thanks,   Huston Foley, MD, PhD [facility])

## 2019-06-25 ENCOUNTER — Telehealth: Payer: Self-pay

## 2019-06-25 NOTE — Telephone Encounter (Signed)
-----   Message from Huston Foley, MD sent at 06/21/2019  8:46 AM EST ----- Patient referred by Dr. Neva Seat, seen by me on 04/24/19, diagnostic PSG on 04/27/19. Patient had a CPAP titration study on 06/13/19.  Please call and inform patient that I have entered an order for treatment with positive airway pressure (PAP) treatment for obstructive sleep apnea (OSA). She did well during the latest sleep study with PAP. We will, therefore, arrange for a machine for home use through a DME (durable medical equipment) company of Her choice; and I will see the patient back in follow-up in about 10 weeks. Please also explain to the patient that I will be looking out for compliance data, which can be downloaded from the machine (stored on an SD card, that is inserted in the machine) or via remote access through a modem, that is built into the machine. At the time of the followup appointment we will discuss sleep study results and how it is going with PAP treatment at home. Please advise patient to bring Her machine at the time of the first FU visit, even though this is cumbersome. Bringing the machine for every visit after that will likely not be needed, but often helps for the first visit to troubleshoot if needed. Please re-enforce the importance of compliance with treatment and the need for [place] to monitor compliance data - often an insurance requirement and actually good feedback for the patient as far as how they are doing.  Also remind patient, that any interim PAP machine or mask issues should be first addressed with the DME company, as they can often help better with technical and mask fit issues. Please ask if patient has a preference regarding DME company.  Please also make sure, the patient has a follow-up appointment with me in about 10 weeks from the setup date, thanks. May see one of our nurse practitioners if needed for proper timing of the FU appointment.  Please fax or rout report to the referring provider. Thanks,    Huston Foley, MD, PhD [facility])

## 2019-06-25 NOTE — Telephone Encounter (Signed)
I reached out to the pt and advised of results.  Pt verbalized understanding and is agreeable to cpap therapy. She will use [facility] for DME and f/u appt has been made for 10/11/2019.  Pt understands to be compliant with insurance she must use the machine at lease 4 hours ever night.   Order has been sent to [facility].

## 2019-07-26 ENCOUNTER — Telehealth: Payer: Self-pay | Admitting: *Deleted

## 2019-07-26 ENCOUNTER — Other Ambulatory Visit: Payer: Self-pay | Admitting: *Deleted

## 2019-07-26 DIAGNOSIS — K219 Gastro-esophageal reflux disease without esophagitis: Secondary | ICD-10-CM

## 2019-07-26 DIAGNOSIS — I10 Essential (primary) hypertension: Secondary | ICD-10-CM

## 2019-07-26 MED ORDER — AMLODIPINE BESYLATE 5 MG PO TABS: 5.0000 mg | ORAL_TABLET | Freq: Every day | ORAL | 0 refills | Status: DC

## 2019-07-26 MED ORDER — OMEPRAZOLE 20 MG PO CPDR: 20.0000 mg | DELAYED_RELEASE_CAPSULE | Freq: Every day | ORAL | 0 refills | Status: DC

## 2019-07-26 MED ORDER — LOSARTAN POTASSIUM 100 MG PO TABS: 100.0000 mg | ORAL_TABLET | Freq: Every day | ORAL | 0 refills | Status: DC

## 2019-07-26 MED ORDER — HYDROCHLOROTHIAZIDE 25 MG PO TABS: 25.0000 mg | ORAL_TABLET | Freq: Every day | ORAL | 0 refills | Status: DC

## 2019-07-26 NOTE — Telephone Encounter (Signed)
Please schedule patient with Dr. Neva Seat per patient last visit in December a 2-3 week follow up was suppose to be scheduled .    Medications will be sent in but patient will need to be seen before they run out.    Thank You

## 2019-07-27 ENCOUNTER — Telehealth: Payer: Self-pay | Admitting: Family Medicine

## 2019-07-27 NOTE — Telephone Encounter (Signed)
Pt is concerned about her metoprolol tartrate (LOPRESSOR) 100 MG tablet [[id number]] script. She would like to know if she takes one or two pills a day. She has called her pharmacy and they advised her to call PCP. Please advise at [phone].

## 2019-07-27 NOTE — Telephone Encounter (Signed)
Spoke with Erika Wolf and advised her that she should be taking the mediation x1 a day according to the Rx by dr. Chilton Si

## 2019-07-27 NOTE — Telephone Encounter (Signed)
Lvmtcb to sch apt.

## 2019-08-24 ENCOUNTER — Ambulatory Visit (INDEPENDENT_AMBULATORY_CARE_PROVIDER_SITE_OTHER): Payer: PRIVATE HEALTH INSURANCE

## 2019-08-24 ENCOUNTER — Other Ambulatory Visit: Payer: Self-pay

## 2019-08-24 ENCOUNTER — Encounter: Payer: Self-pay | Admitting: Family Medicine

## 2019-08-24 ENCOUNTER — Ambulatory Visit (INDEPENDENT_AMBULATORY_CARE_PROVIDER_SITE_OTHER): Payer: PRIVATE HEALTH INSURANCE | Admitting: Family Medicine

## 2019-08-24 VITALS — BP 128/72 | HR 73 | Temp 98.1°F | Ht 61.0 in | Wt 201.0 lb

## 2019-08-24 DIAGNOSIS — M79642 Pain in left hand: Secondary | ICD-10-CM | POA: Diagnosis not present

## 2019-08-24 DIAGNOSIS — K219 Gastro-esophageal reflux disease without esophagitis: Secondary | ICD-10-CM

## 2019-08-24 DIAGNOSIS — I10 Essential (primary) hypertension: Secondary | ICD-10-CM | POA: Diagnosis not present

## 2019-08-24 DIAGNOSIS — G4733 Obstructive sleep apnea (adult) (pediatric): Secondary | ICD-10-CM

## 2019-08-24 DIAGNOSIS — M7989 Other specified soft tissue disorders: Secondary | ICD-10-CM

## 2019-08-24 DIAGNOSIS — M1812 Unilateral primary osteoarthritis of first carpometacarpal joint, left hand: Secondary | ICD-10-CM | POA: Diagnosis not present

## 2019-08-24 DIAGNOSIS — Z1329 Encounter for screening for other suspected endocrine disorder: Secondary | ICD-10-CM

## 2019-08-24 DIAGNOSIS — E785 Hyperlipidemia, unspecified: Secondary | ICD-10-CM

## 2019-08-24 MED ORDER — AMLODIPINE BESYLATE 5 MG PO TABS: 5.0000 mg | ORAL_TABLET | Freq: Every day | ORAL | 1 refills | Status: DC

## 2019-08-24 MED ORDER — PREDNISONE 20 MG PO TABS: 40.0000 mg | ORAL_TABLET | Freq: Every day | ORAL | 0 refills | Status: DC

## 2019-08-24 MED ORDER — LOSARTAN POTASSIUM 100 MG PO TABS: 100.0000 mg | ORAL_TABLET | Freq: Every day | ORAL | 1 refills | Status: DC

## 2019-08-24 MED ORDER — OMEPRAZOLE 20 MG PO CPDR: 20.0000 mg | DELAYED_RELEASE_CAPSULE | Freq: Every day | ORAL | 1 refills | Status: DC

## 2019-08-24 MED ORDER — METOPROLOL TARTRATE 100 MG PO TABS: 100.0000 mg | ORAL_TABLET | Freq: Every day | ORAL | 1 refills | Status: DC

## 2019-08-24 MED ORDER — HYDROCHLOROTHIAZIDE 25 MG PO TABS: 25.0000 mg | ORAL_TABLET | Freq: Every day | ORAL | 1 refills | Status: DC

## 2019-08-24 NOTE — Progress Notes (Signed)
Subjective:  Patient ID: Erika Wolf, female    DOB: Nov 17, 1955  Age: 64 y.o. MRN: [id number]  CC:  Chief Complaint  Patient presents with  . Hypertension  . joint pain in hands    x 2 months, L thumb stinging / throbbing pain     HPI Erika Wolf presents for  Hypertension, with obstructive sleep apnea Amlodipine 5 mg daily, losartan 100 mg daily, hctz 25, metoprolol 100 mg daily. Snoring and daytime somnolence discussed at November 2020 visit.  Polysomnogram 04/27/2019, AHI 40, REM AHI 99.5.  O2 nadir 72%.  Plan for repeat sleep study, split study for CPAP fitting/titration.  Performed on January 27. Wearing cpap - feeling more rested.  No new med side effects.  Home readings: occasional home readings -normal.   BP Readings from Last 3 Encounters:  08/24/19 128/72  05/15/19 139/84  04/24/19 (!) 160/91   Lab Results  Component Value Date   CREATININE 0.81 02/07/2019   Leg swelling Started 1 week ago - when traveled to [facility]. Did take some wlaking breaks during drive.  No change in activity. Worse at night - improves in am. Left worse than right. No calf pain, no chest pain/dyspnea, no hx of DVT.    Hyperlipidemia: Slight elevation previously, not on statin. Diet/exercise approach - min exercise, had some weight gain. Some limitation d/t hip issues prior.  No regular fast food. Usually home cooking.  The 10-year ASCVD risk score Mikey Bussing DC Brooke Bonito., et al., 2013) is: 6.1%   Values used to calculate the score:     Age: 58 years     Sex: Female     Is Non-Hispanic African American: No     Diabetic: No     Tobacco smoker: No     Systolic Blood Pressure: 459 mmHg     Is BP treated: Yes     HDL Cholesterol: 58 mg/dL     Total Cholesterol: 213 mg/dL  Lab Results  Component Value Date   CHOL 213 (H) 02/07/2019   HDL 58 02/07/2019   LDLCALC 136 (H) 02/07/2019   TRIG 105 02/07/2019   CHOLHDL 3.7 02/07/2019   Lab Results  Component Value Date   ALT 17 02/07/2019   AST  18 02/07/2019   ALKPHOS 82 02/07/2019   BILITOT 0.4 02/07/2019    Hand pain, left thumb pain/stinging Pain at left base of thumb at wrist.  2 weeks.  NKI.  R hand dominant.  No new activities/projects, but scrolls phone with left thumb.  Tx: none.   GERD Omeprazole 20 mg daily. No hx of PUD, but also taking alleve daily. Working well.   History Patient Active Problem List   Diagnosis Date Noted  . Diverticulitis 03/18/2019  . History of revision of total replacement of right hip joint 10/30/2018  . OA (osteoarthritis) of hip 10/18/2018  . History of hematuria 12/27/2013  . Essential hypertension 12/27/2013  . Surgical menopause on hormone replacement therapy 12/27/2013  . Obesity (BMI 30.0-34.9) 12/27/2013  . 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 HTN (hypertension)   . Urinary incontinence    cough, sneeze   Past Surgical History:  Procedure Laterality Date  . CESAREAN SECTION  5 total  . CHOLECYSTECTOMY    . GALLBLADDER SURGERY    . ROTATOR CUFF REPAIR    . TOTAL ABDOMINAL HYSTERECTOMY  2005   Fibroid ovaries and tubes removed also  . TOTAL HIP ARTHROPLASTY Right 10/18/2018  Procedure: TOTAL HIP ARTHROPLASTY ANTERIOR APPROACH;  Surgeon: Gaynelle Arabian, MD;  Location: [facility];  Service: Orthopedics;  Laterality: Right;  145mn   No Known Allergies Prior to Admission medications   Medication Sig Start Date End Date Taking? Authorizing Provider  amLODipine (NORVASC) 5 MG tablet Take 1 tablet (5 mg total) by mouth daily. 07/26/19  Yes GWendie Agreste MD  calcium-vitamin D (OSCAL WITH D) 500-200 MG-UNIT TABS tablet Take by mouth.   Yes [provider]  cetirizine (ZYRTEC) 10 MG tablet Take 10 mg by mouth daily.   Yes [provider]  fluticasone (KLS ALLER-FLO) 50 MCG/ACT nasal spray Place 1 Dose into the nose daily.   Yes [provider]  hydrochlorothiazide (HYDRODIURIL) 25 MG tablet Take 1  tablet (25 mg total) by mouth daily. 07/26/19  Yes GWendie Agreste MD  losartan (COZAAR) 100 MG tablet Take 1 tablet (100 mg total) by mouth daily. 07/26/19  Yes GWendie Agreste MD  metoprolol tartrate (LOPRESSOR) 100 MG tablet Take 1 tablet (100 mg total) by mouth daily. 02/07/19  Yes GWendie Agreste MD  naproxen sodium (ALEVE) 220 MG tablet Take 220 mg by mouth.   Yes [provider]  omeprazole (PRILOSEC) 20 MG capsule Take 1 capsule (20 mg total) by mouth daily. 07/26/19  Yes GWendie Agreste MD  venlafaxine XR (EFFEXOR XR) 75 MG 24 hr capsule Take 1 capsule (75 mg total) by mouth daily with breakfast. 03/28/19  Yes ANunzio Cobbs MD   Social History   Socioeconomic History  . Marital status: Married    Spouse name: Not on file  . Number of children: Not on file  . Years of education: Not on file  . Highest education level: Not on file  Occupational History  . Occupation: homemaker  Tobacco Use  . Smoking status: Never Smoker  . Smokeless tobacco: Never Used  Substance and Sexual Activity  . Alcohol use: Yes    Comment: very rarely  . Drug use: No  . Sexual activity: Yes    Birth control/protection: Surgical    Comment: hysterectomy  Other Topics Concern  . Not on file  Social History Narrative   Married;   5 children, 4 [place] 1 [place]   2 grandchildren         Social Determinants of HRadio broadcast assistantStrain:   . Difficulty of Paying Living Expenses:   Food Insecurity:   . Worried About RCharity fundraiserin the Last Year:   . RArboriculturistin the Last Year:   Transportation Needs:   . LFilm/video editor(Medical):   .Marland KitchenLack of Transportation (Non-Medical):   Physical Activity:   . Days of Exercise per Week:   . Minutes of Exercise per Session:   Stress:   . Feeling of Stress :   Social Connections:   . Frequency of Communication with Friends and Family:   . Frequency of Social Gatherings with  Friends and Family:   . Attends Religious Services:   . Active Member of Clubs or Organizations:   . Attends CArchivistMeetings:   .Marland KitchenMarital Status:   Intimate Partner Violence:   . Fear of Current or Ex-Partner:   . Emotionally Abused:   .Marland KitchenPhysically Abused:   . Sexually Abused:     Review of Systems  Constitutional: Negative for fatigue and unexpected weight change.  Respiratory: Negative for  chest tightness and shortness of breath.   Cardiovascular: Positive for leg swelling. Negative for chest pain and palpitations.  Gastrointestinal: Negative for abdominal pain and blood in stool.  Neurological: Negative for dizziness, syncope, light-headedness and headaches.     Objective:   Vitals:   08/24/19 1409 08/24/19 1422  BP: (!) 148/79 128/72  Pulse: 73   Temp: 98.1 F (36.7 C)   SpO2: 95%   Weight: 201 lb (91.2 kg)   Height: '5\' 1"'$  (1.549 m)      Physical Exam Vitals reviewed.  Constitutional:      Appearance: She is well-developed.  HENT:     Head: Normocephalic and atraumatic.  Eyes:     Conjunctiva/sclera: Conjunctivae normal.     Pupils: Pupils are equal, round, and reactive to light.  Neck:     Vascular: No carotid bruit.  Cardiovascular:     Rate and Rhythm: Normal rate and regular rhythm.     Heart sounds: Normal heart sounds.  Pulmonary:     Effort: Pulmonary effort is normal.     Breath sounds: Normal breath sounds.  Abdominal:     Palpations: Abdomen is soft. There is no pulsatile mass.     Tenderness: There is no abdominal tenderness.  Musculoskeletal:     Left hand: Swelling, tenderness and bony tenderness (ttp left CMC, discomfort with range of motion.  Some associated soft tissue swelling the same area.  Negative Finkelstein, scaphoid nontender.) present.     Right lower leg: Edema (Trace to 1+ edema at ankles bilaterally, slightly more prominent on the left to lower third tibia.  Slight tightness of the posterior calf towards Achilles  but upper calf nontender without cords.  Negative Homans.) present.     Left lower leg: Edema present.  Skin:    General: Skin is warm and dry.  Neurological:     Mental Status: She is alert and oriented to person, place, and time.  Psychiatric:        Behavior: Behavior normal.   DG Finger Thumb Left  Result Date: 08/24/2019 CLINICAL DATA:  Left CMC pain EXAM: LEFT THUMB 2+V COMPARISON:  None. FINDINGS: Early osteoarthritis at the 1st carpometacarpal joint with joint space narrowing and early spurring. No acute bony abnormality. Specifically, no fracture, subluxation, or dislocation. Soft tissues are unremarkable. IMPRESSION: Early osteoarthritis at the 1st carpometacarpal joint. No acute bony abnormality. Electronically Signed   By: Rolm Baptise M.D.   On: 08/24/2019 15:18     Assessment & Plan:  Erika Wolf is a 64 y.o. female . Essential hypertension - Plan: CMP14+EGFR, TSH  -  Stable, tolerating current regimen.  Labs pending as above.   Obstructive sleep apnea  -Improved with treatment.  Gastroesophageal reflux disease, unspecified whether esophagitis present  -Stable with Prilosec.  Continue same  Arthritis of carpometacarpal (CMC) joint of left thumb - Plan: predniSONE (DELTASONE) 20 MG tablet Hand pain, left - Plan: DG Finger Thumb Left, predniSONE (DELTASONE) 20 MG tablet  -For CMC arthritis, agreed on temporary prednisone 40 mg daily x5 days with RTC precautions and side effects discussed.  Consider hand specialist eval if persistent  Leg swelling - Plan: D-dimer, quantitative (not at [facility]), TSH  -Bilateral leg swelling, possible pedal edema with amlodipine.  Left greater than right with travel to [place]. DVT risk factors or prior DVT.  Check D-dimer, then ultrasound if elevated.  ER precautions.  Screening for thyroid disorder - Plan: TSH  -Check TSH with history of edema but  less likely cause.  Hyperlipidemia, unspecified hyperlipidemia type - Plan: CMP14+EGFR,  Lipid panel  -Check labs, then recheck ASCVD risk score to determine statin need  Meds ordered this encounter  Medications  . predniSONE (DELTASONE) 20 MG tablet    Sig: Take 2 tablets (40 mg total) by mouth daily with breakfast.    Dispense:  10 tablet    Refill:  0   Patient Instructions     Prednisone written for 5 days for the left hand pain, arthritis seen on x-ray.  If not improving I would recommend follow-up with hand specialist.  No change in the medications for now.  If blood clot screening test is elevated, I can arrange for an ultrasound of your left leg, but it is unlikely blood test at this time.  Amlodipine sometimes can cause leg swelling, can decrease to half pill if you have persistent leg swelling, but monitor your blood pressure for elevations if you do make that change and let me know.  See other information below.  Thanks for coming in today.   Peripheral Edema  Peripheral edema is swelling that is caused by a buildup of fluid. Peripheral edema most often affects the lower legs, ankles, and feet. It can also develop in the arms, hands, and face. The area of the body that has peripheral edema will look swollen. It may also feel heavy or warm. Your clothes may start to feel tight. Pressing on the area may make a temporary dent in your skin. You may not be able to move your swollen arm or leg as much as usual. There are many causes of peripheral edema. It can happen because of a complication of other conditions such as congestive heart failure, kidney disease, or a problem with your blood circulation. It also can be a side effect of certain medicines or because of an infection. It often happens to women during pregnancy. Sometimes, the cause is not known. Follow these instructions at home: Managing pain, stiffness, and swelling   Raise (elevate) your legs while you are sitting or lying down.  Move around often to prevent stiffness and to lessen swelling.  Do not  sit or stand for long periods of time.  Wear support stockings as told by your health care provider. Medicines  Take over-the-counter and prescription medicines only as told by your health care provider.  Your health care provider may prescribe medicine to help your body get rid of excess water (diuretic). General instructions  Pay attention to any changes in your symptoms.  Follow instructions from your health care provider about limiting salt (sodium) in your diet. Sometimes, eating less salt may reduce swelling.  Moisturize skin daily to help prevent skin from cracking and draining.  Keep all follow-up visits as told by your health care provider. This is important. Contact a health care provider if you have:  A fever.  Edema that starts suddenly or is getting worse, especially if you are pregnant or have a medical condition.  Swelling in only one leg.  Increased swelling, redness, or pain in one or both of your legs.  Drainage or sores at the area where you have edema. Get help right away if you:  Develop shortness of breath, especially when you are lying down.  Have pain in your chest or abdomen.  Feel weak.  Feel faint. Summary  Peripheral edema is swelling that is caused by a buildup of fluid. Peripheral edema most often affects the lower legs, ankles, and feet.  Move around often to prevent stiffness and to lessen swelling. Do not sit or stand for long periods of time.  Pay attention to any changes in your symptoms.  Contact a health care provider if you have edema that starts suddenly or is getting worse, especially if you are pregnant or have a medical condition.  Get help right away if you develop shortness of breath, especially when lying down. This information is not intended to replace advice given to you by your health care provider. Make sure you discuss any questions you have with your health care provider. Document Revised: 01/25/2018 Document  Reviewed: 01/25/2018 Elsevier Patient Education  [employer].   If you have lab work done today you will be contacted with your lab results within the next 2 weeks.  If you have not heard from [place] then please contact us. The fastest way to get your results is to register for My Chart.   IF you received an x-ray today, you will receive an invoice from [facility]. Please contact [facility] at [phone] with questions or concerns regarding your invoice.   IF you received labwork today, you will receive an invoice from [facility]. Please contact [facility] at [phone] with questions or concerns regarding your invoice.   Our billing staff will not be able to assist you with questions regarding bills from these companies.  You will be contacted with the lab results as soon as they are available. The fastest way to get your results is to activate your My Chart account. Instructions are located on the last page of this paperwork. If you have not heard from [place] regarding the results in 2 weeks, please contact this office.          Signed, Merri Ray, MD [facility]

## 2019-08-24 NOTE — Patient Instructions (Addendum)
Prednisone written for 5 days for the left hand pain, arthritis seen on x-ray.  If not improving I would recommend follow-up with hand specialist.  No change in medications for now.  If blood clot screening test is elevated, I can arrange for an ultrasound of your left leg, but it is unlikely blood test at this time.  Amlodipine sometimes can cause leg swelling, can decrease to half pill if you have persistent leg swelling, but monitor your blood pressure for elevations if you do make that change and let me know.  See other information below.  Thanks for coming in today.   Peripheral Edema  Peripheral edema is swelling that is caused by a buildup of fluid. Peripheral edema most often affects the lower legs, ankles, and feet. It can also develop in the arms, hands, and face. The area of the body that has peripheral edema will look swollen. It may also feel heavy or warm. Your clothes may start to feel tight. Pressing on the area may make a temporary dent in your skin. You may not be able to move your swollen arm or leg as much as usual. There are many causes of peripheral edema. It can happen because of a complication of other conditions such as congestive heart failure, kidney disease, or a problem with your blood circulation. It also can be a side effect of certain medicines or because of an infection. It often happens to women during pregnancy. Sometimes, the cause is not known. Follow these instructions at home: Managing pain, stiffness, and swelling   Raise (elevate) your legs while you are sitting or lying down.  Move around often to prevent stiffness and to lessen swelling.  Do not sit or stand for long periods of time.  Wear support stockings as told by your health care provider. Medicines  Take over-the-counter and prescription medicines only as told by your health care provider.  Your health care provider may prescribe medicine to help your body get rid of excess water  (diuretic). General instructions  Pay attention to any changes in your symptoms.  Follow instructions from your health care provider about limiting salt (sodium) in your diet. Sometimes, eating less salt may reduce swelling.  Moisturize skin daily to help prevent skin from cracking and draining.  Keep all follow-up visits as told by your health care provider. This is important. Contact a health care provider if you have:  A fever.  Edema that starts suddenly or is getting worse, especially if you are pregnant or have a medical condition.  Swelling in only one leg.  Increased swelling, redness, or pain in one or both of your legs.  Drainage or sores at the area where you have edema. Get help right away if you:  Develop shortness of breath, especially when you are lying down.  Have pain in your chest or abdomen.  Feel weak.  Feel faint. Summary  Peripheral edema is swelling that is caused by a buildup of fluid. Peripheral edema most often affects the lower legs, ankles, and feet.  Move around often to prevent stiffness and to lessen swelling. Do not sit or stand for long periods of time.  Pay attention to any changes in your symptoms.  Contact a health care provider if you have edema that starts suddenly or is getting worse, especially if you are pregnant or have a medical condition.  Get help right away if you develop shortness of breath, especially when lying down. This information is not intended to  replace advice given to you by your health care provider. Make sure you discuss any questions you have with your health care provider. Document Revised: 01/25/2018 Document Reviewed: 01/25/2018 Elsevier Patient Education  [employer].   If you have lab work done today you will be contacted with your lab results within the next 2 weeks.  If you have not heard from [place] then please contact us. The fastest way to get your results is to register for My Chart.   IF you  received an x-ray today, you will receive an invoice from [facility]. Please contact [facility] at [phone] with questions or concerns regarding your invoice.   IF you received labwork today, you will receive an invoice from [place]. Please contact [facility] at [phone] with questions or concerns regarding your invoice.   Our billing staff will not be able to assist you with questions regarding bills from these companies.  You will be contacted with the lab results as soon as they are available. The fastest way to get your results is to activate your My Chart account. Instructions are located on the last page of this paperwork. If you have not heard from [place] regarding the results in 2 weeks, please contact this office.

## 2019-08-25 ENCOUNTER — Encounter: Payer: Self-pay | Admitting: Family Medicine

## 2019-08-25 LAB — CMP14+EGFR
ALT: 21 IU/L (ref 0–32)
AST: 23 IU/L (ref 0–40)
Albumin/Globulin Ratio: 1.5 (ref 1.2–2.2)
Albumin: 4.3 g/dL (ref 3.8–4.8)
Alkaline Phosphatase: 77 IU/L (ref 39–117)
BUN/Creatinine Ratio: 21 (ref 12–28)
BUN: 18 mg/dL (ref 8–27)
Bilirubin Total: 0.2 mg/dL (ref 0.0–1.2)
CO2: 25 mmol/L (ref 20–29)
Calcium: 9.8 mg/dL (ref 8.7–10.3)
Chloride: 99 mmol/L (ref 96–106)
Creatinine, Ser: 0.87 mg/dL (ref 0.57–1.00)
GFR calc Af Amer: 82 mL/min/{1.73_m2} (ref >59)
GFR calc non Af Amer: 71 mL/min/{1.73_m2} (ref >59)
Globulin, Total: 2.9 g/dL (ref 1.5–4.5)
Glucose: 83 mg/dL (ref 65–99)
Potassium: 4.3 mmol/L (ref 3.5–5.2)
Sodium: 139 mmol/L (ref 134–144)
Total Protein: 7.2 g/dL (ref 6.0–8.5)

## 2019-08-25 LAB — LIPID PANEL
Chol/HDL Ratio: 3.6 ratio (ref 0.0–4.4)
Cholesterol, Total: 192 mg/dL (ref 100–199)
HDL: 53 mg/dL (ref >39)
LDL Chol Calc (NIH): 111 mg/dL — ABNORMAL HIGH (ref 0–99)
Triglycerides: 161 mg/dL — ABNORMAL HIGH (ref 0–149)
VLDL Cholesterol Cal: 28 mg/dL (ref 5–40)

## 2019-08-25 LAB — D-DIMER, QUANTITATIVE: D-DIMER: 1.06 mg/L FEU — ABNORMAL HIGH (ref 0.00–0.49)

## 2019-08-25 LAB — TSH: TSH: 1.63 u[IU]/mL (ref 0.450–4.500)

## 2019-08-27 ENCOUNTER — Encounter (HOSPITAL_COMMUNITY): Payer: PRIVATE HEALTH INSURANCE

## 2019-08-27 ENCOUNTER — Other Ambulatory Visit: Payer: Self-pay | Admitting: Family Medicine

## 2019-08-27 DIAGNOSIS — M7989 Other specified soft tissue disorders: Secondary | ICD-10-CM

## 2019-08-27 DIAGNOSIS — R7989 Other specified abnormal findings of blood chemistry: Secondary | ICD-10-CM

## 2019-08-27 NOTE — Progress Notes (Signed)
See OV and labs - elevated ddimer, left greater than R leg swelling, prior car travel to [facility]. R/o dvt.

## 2019-08-28 ENCOUNTER — Ambulatory Visit (HOSPITAL_COMMUNITY)
Admission: RE | Admit: 2019-08-28 | Discharge: 2019-08-28 | Disposition: A | Discharge status: Home / Self Care | Payer: No Typology Code available for payment source | Source: Ambulatory Visit | Attending: Family Medicine | Admitting: Family Medicine

## 2019-08-28 ENCOUNTER — Other Ambulatory Visit: Payer: Self-pay

## 2019-08-28 DIAGNOSIS — R7989 Other specified abnormal findings of blood chemistry: Secondary | ICD-10-CM | POA: Diagnosis not present

## 2019-08-28 DIAGNOSIS — M7989 Other specified soft tissue disorders: Secondary | ICD-10-CM | POA: Insufficient documentation

## 2019-08-28 NOTE — Progress Notes (Signed)
Lower venous duplex       has been completed. Preliminary results can be found under CV proc through chart review. Tiaja Hagan, BS, RDMS, RVT   

## 2019-10-11 ENCOUNTER — Ambulatory Visit: Payer: Self-pay | Admitting: Neurology

## 2019-10-25 ENCOUNTER — Ambulatory Visit: Payer: Self-pay | Admitting: Neurology

## 2019-11-21 ENCOUNTER — Other Ambulatory Visit: Payer: Self-pay | Admitting: Obstetrics and Gynecology

## 2019-11-21 MED ORDER — VENLAFAXINE HCL ER 75 MG PO CP24: 75.0000 mg | ORAL_CAPSULE | Freq: Every day | ORAL | 0 refills | Status: DC

## 2019-11-21 NOTE — Telephone Encounter (Signed)
Medication refill request: Effexor Last AEX:  12/27/18 Dr. Edward Jolly Next AEX: none scheduled Last MMG (if hormonal medication request): n/a Refill authorized: Today, please advise. Will contact patient to schedule AEX.

## 2019-11-21 NOTE — Telephone Encounter (Signed)
Patient is requesting a refill for Effexor to be sent to [facility].

## 2019-12-20 ENCOUNTER — Encounter: Payer: Self-pay | Admitting: Family Medicine

## 2019-12-20 NOTE — Telephone Encounter (Signed)
That is a fairly large increase from 100 to 200 mg total per day, but if she is taking 100 mg twice per day, and blood pressures are stable without any side effects I am okay with refilling the medication at that dosing, 100 mg twice per day.   If lightheadedness, dizziness or side effects at that dose, please schedule visit so we can adjust medications.  Thanks.

## 2019-12-25 ENCOUNTER — Telehealth: Payer: Self-pay | Admitting: Family Medicine

## 2019-12-25 DIAGNOSIS — I10 Essential (primary) hypertension: Secondary | ICD-10-CM

## 2019-12-25 NOTE — Telephone Encounter (Signed)
Check message from 12/20/19. She does have an upcoming  appointment with Dr. Neva Seat on 12/28/19. She would like a curtesy refill on her metoprolol tartrate until then.  Marland Kitchen

## 2019-12-26 MED ORDER — METOPROLOL TARTRATE 100 MG PO TABS: 100.0000 mg | ORAL_TABLET | Freq: Every day | ORAL | 0 refills | Status: DC

## 2019-12-28 ENCOUNTER — Other Ambulatory Visit: Payer: Self-pay

## 2019-12-28 ENCOUNTER — Encounter: Payer: Self-pay | Admitting: Family Medicine

## 2019-12-28 ENCOUNTER — Ambulatory Visit (INDEPENDENT_AMBULATORY_CARE_PROVIDER_SITE_OTHER): Payer: PRIVATE HEALTH INSURANCE | Admitting: Family Medicine

## 2019-12-28 VITALS — BP 134/85 | HR 112 | Temp 98.3°F | Resp 15 | Ht 61.0 in | Wt 191.0 lb

## 2019-12-28 DIAGNOSIS — K219 Gastro-esophageal reflux disease without esophagitis: Secondary | ICD-10-CM

## 2019-12-28 DIAGNOSIS — E785 Hyperlipidemia, unspecified: Secondary | ICD-10-CM | POA: Diagnosis not present

## 2019-12-28 DIAGNOSIS — I10 Essential (primary) hypertension: Secondary | ICD-10-CM

## 2019-12-28 DIAGNOSIS — Z1231 Encounter for screening mammogram for malignant neoplasm of breast: Secondary | ICD-10-CM | POA: Diagnosis not present

## 2019-12-28 MED ORDER — HYDROCHLOROTHIAZIDE 25 MG PO TABS: 25.0000 mg | ORAL_TABLET | Freq: Every day | ORAL | 1 refills | Status: DC

## 2019-12-28 MED ORDER — AMLODIPINE BESYLATE 5 MG PO TABS: 5.0000 mg | ORAL_TABLET | Freq: Every day | ORAL | 1 refills | Status: DC

## 2019-12-28 MED ORDER — LOSARTAN POTASSIUM 100 MG PO TABS: 100.0000 mg | ORAL_TABLET | Freq: Every day | ORAL | 1 refills | Status: DC

## 2019-12-28 MED ORDER — METOPROLOL TARTRATE 100 MG PO TABS: 100.0000 mg | ORAL_TABLET | Freq: Every day | ORAL | 1 refills | Status: DC

## 2019-12-28 NOTE — Patient Instructions (Addendum)
  Keep a record of your blood pressures outside of the office and bring them to the next office visit. See info on how to check BP most effectively.  Bring meter for nurse visit in next week to see if meter is accurate.  Restart metoprolol at once per day for now, but we have option of twice daily if persistent high home readings.   Return to the clinic or go to the nearest emergency room if any of your symptoms worsen or new symptoms occur.  Return in 6 months for a physical. Take care!  If you have lab work done today you will be contacted with your lab results within the next 2 weeks.  If you have not heard from [place] then please contact us. The fastest way to get your results is to register for My Chart.   IF you received an x-ray today, you will receive an invoice from [facility]. Please contact [facility] at [phone] with questions or concerns regarding your invoice.   IF you received labwork today, you will receive an invoice from [place]. Please contact [facility] at [phone] with questions or concerns regarding your invoice.   Our billing staff will not be able to assist you with questions regarding bills from these companies.  You will be contacted with the lab results as soon as they are available. The fastest way to get your results is to activate your My Chart account. Instructions are located on the last page of this paperwork. If you have not heard from [place] regarding the results in 2 weeks, please contact this office.

## 2019-12-28 NOTE — Progress Notes (Signed)
Subjective:  Patient ID: Erika Wolf, female    DOB: 29-Jun-1955  Age: 64 y.o. MRN: [id number]  CC:  Chief Complaint  Patient presents with   Hypertension    pt believes her metoprolol should be increased pt doubled it on july 4th and was taking 1 tablet two times daily for a total of 200 mg daily pt notes she did have some headaches and lightheaded episodes the last month or so but has been out of med for 1 week now due to increased dose. pt did increase dose and continue amlodapine, losartan and HCTZ   Referral    pt requesting referral for annual mammogram    HPI Erika Wolf presents for   Hypertension: With history of obstructive sleep apnea.  Wears CPAP.  See recent telephone message. Home readings have been increasing, she increase her metoprolol from 100 mg/day to 200 mg total per day.  Had been on 100 mg twice daily in the past but not recently. She is also taking losartan 100 mg daily, amlodipine 5 mg daily. Home readings started go up in June - 140/91- 188/116.  Upper arm monitor. Reading today 140/84.  When BP high - senses it - no specific symptoms. No associated flushing or palpitations, no headache.  Out of metoprolol past week - ran out.  Prior leg swelling resolved.  BP Readings from Last 3 Encounters:  12/28/19 134/85  08/24/19 128/72  05/15/19 139/84   Lab Results  Component Value Date   CREATININE 0.87 08/24/2019   Lab Results  Component Value Date   TSH 1.630 08/24/2019      Heartburn: Rare need of omeprazole when taking naprosyn.   Hyperlipidemia: Has adjusted diet - less starch, carbs. Drinking gallon of water per day. Has lost 10 pounds.  No otc appetite suppressants. Fasting today  Wt Readings from Last 3 Encounters:  12/28/19 191 lb (86.6 kg)  08/24/19 201 lb (91.2 kg)  05/15/19 193 lb (87.5 kg)    Lab Results  Component Value Date   CHOL 192 08/24/2019   HDL 53 08/24/2019   LDLCALC 111 (H) 08/24/2019   TRIG 161 (H) 08/24/2019     CHOLHDL 3.6 08/24/2019   Lab Results  Component Value Date   ALT 21 08/24/2019   AST 23 08/24/2019   ALKPHOS 77 08/24/2019   BILITOT 0.2 08/24/2019   Health Maintenance: mammogram January 19, 2019 at [place], no mammographic evidence of malignancy.  Repeat in 1 year recommended. Has discount program - needs referral. ? Contract with [facility].    covid 19 vaccine: declines, no questions.  Hep C/HIV screening: declines.  Tdap: declines.   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   Past Medical History:  Diagnosis Date   HTN (hypertension)    Urinary incontinence    cough, sneeze   Past Surgical History:  Procedure Laterality Date   CESAREAN SECTION  5 total   CHOLECYSTECTOMY     GALLBLADDER SURGERY     ROTATOR CUFF REPAIR     TOTAL ABDOMINAL HYSTERECTOMY  2005   Fibroid ovaries and tubes removed also   TOTAL HIP ARTHROPLASTY Right 10/18/2018  Procedure: TOTAL HIP ARTHROPLASTY ANTERIOR APPROACH;  Surgeon: Ollen GrossAluisio, Frank, MD;  Location: [facility];  Service: Orthopedics;  Laterality: Right;  100min   No Known Allergies Prior to Admission medications   Medication Sig Start Date End Date Taking? Authorizing Provider  amLODipine (NORVASC) 5 MG tablet Take 1 tablet (5 mg total) by mouth daily. 08/24/19  Yes Shade FloodGreene, Leomar Westberg R, MD  calcium-vitamin D (OSCAL WITH D) 500-200 MG-UNIT TABS tablet Take by mouth.   Yes [provider]  cetirizine (ZYRTEC) 10 MG tablet Take 10 mg by mouth daily.   Yes [provider]  fluticasone (KLS ALLER-FLO) 50 MCG/ACT nasal spray Place 1 Dose into  the nose daily.   Yes [provider]  hydrochlorothiazide (HYDRODIURIL) 25 MG tablet Take 1 tablet (25 mg total) by mouth daily. 08/24/19  Yes Shade FloodGreene, Isha Seefeld R, MD  losartan (COZAAR) 100 MG tablet Take 1 tablet (100 mg total) by mouth daily. 08/24/19  Yes Shade FloodGreene, Shandale Malak R, MD  metoprolol tartrate (LOPRESSOR) 100 MG tablet Take 1 tablet (100 mg total) by mouth daily. 12/26/19  Yes Shade FloodGreene, Catalea Labrecque R, MD  naproxen sodium (ALEVE) 220 MG tablet Take 220 mg by mouth.   Yes [provider]  omeprazole (PRILOSEC) 20 MG capsule Take 1 capsule (20 mg total) by mouth daily. 08/24/19  Yes Shade FloodGreene, Leianna Barga R, MD  venlafaxine XR (EFFEXOR XR) 75 MG 24 hr capsule Take 1 capsule (75 mg total) by mouth daily with breakfast. 11/21/19  Yes Patton SallesAmundson C Silva, Brook E, MD  predniSONE (DELTASONE) 20 MG tablet Take 2 tablets (40 mg total) by mouth daily with breakfast. Patient not taking: Reported on 12/28/2019 08/24/19   Shade FloodGreene, Diesha Rostad R, MD   Social History   Socioeconomic History   Marital status: Married    Spouse name: Not on file   Number of children: Not on file   Years of education: Not on file   Highest education level: Not on file  Occupational History   Occupation: homemaker  Tobacco Use   Smoking status: Never Smoker   Smokeless tobacco: Never Used  Vaping Use   Vaping Use: Never used  Substance and Sexual Activity   Alcohol use: Yes    Comment: very rarely   Drug use: No   Sexual activity: Yes    Birth control/protection: Surgical    Comment: hysterectomy  Other Topics Concern   Not on file  Social History Narrative   Married;   5 children, 4 live in [place], 1 lives in [place]   2 grandchildren         Social Determinants of Corporate investment bankerHealth   Financial Resource Strain:    Difficulty of Paying Living Expenses:   Food Insecurity:    Worried About Programme researcher, broadcasting/film/videounning Out of Food in the Last Year:    Baristaan Out of Food in the Last Year:   Transportation Needs:    Automotive engineerLack of  Transportation (Medical):    Lack of Transportation (Non-Medical):   Physical Activity:    Days of Exercise per Week:    Minutes of Exercise per Session:   Stress:    Feeling of Stress :   Social Connections:    Frequency of Communication with Friends and Family:    Frequency of Social Gatherings with Friends and Family:    Attends Religious Services:    Active Member of Clubs or Organizations:    Attends BankerClub or Organization Meetings:    Marital Status:   Intimate Partner Violence:    Fear  of Current or Ex-Partner:    Emotionally Abused:    Physically Abused:    Sexually Abused:     Review of Systems  Constitutional: Negative for fatigue and unexpected weight change.  Respiratory: Negative for chest tightness and shortness of breath.   Cardiovascular: Negative for chest pain, palpitations and leg swelling.  Gastrointestinal: Negative for abdominal pain and blood in stool.  Neurological: Negative for dizziness, syncope, light-headedness and headaches.     Objective:   Vitals:   12/28/19 1108  BP: 134/85  Pulse: (!) 112  Resp: 15  Temp: 98.3 F (36.8 C)  TempSrc: Temporal  SpO2: 97%  Weight: 191 lb (86.6 kg)  Height: 5\' 1"  (1.549 m)     Physical Exam Vitals reviewed.  Constitutional:      Appearance: She is well-developed.  HENT:     Head: Normocephalic and atraumatic.  Eyes:     Conjunctiva/sclera: Conjunctivae normal.     Pupils: Pupils are equal, round, and reactive to light.  Neck:     Vascular: No carotid bruit.  Cardiovascular:     Rate and Rhythm: Regular rhythm. Tachycardia present.     Heart sounds: Normal heart sounds.     Comments: Tachy , but regular.  Pulmonary:     Effort: Pulmonary effort is normal.     Breath sounds: Normal breath sounds.  Abdominal:     Palpations: Abdomen is soft. There is no pulsatile mass.     Tenderness: There is no abdominal tenderness.  Skin:    General: Skin is warm and dry.  Neurological:      Mental Status: She is alert and oriented to person, place, and time.  Psychiatric:        Behavior: Behavior normal.     Assessment & Plan:  Erika Wolf is a 64 y.o. female . Essential hypertension - Plan: metoprolol tartrate (LOPRESSOR) 100 MG tablet, Comprehensive metabolic panel, losartan (COZAAR) 100 MG tablet, hydrochlorothiazide (HYDRODIURIL) 25 MG tablet, amLODipine (NORVASC) 5 MG tablet  -Stable in office, and has been off metoprolol completely for past week.  Has had some reported elevated home readings, question accuracy of her meter versus intermittent flares of hypertension.  Differential would include secondary hypertension, less likely pheochromocytoma without associated palpitations, flushing, headache.    -Nurse visit to check accuracy of her home blood pressure monitoring the next week.  Restart metoprolol at 100 mg daily for now but option of twice daily dosing if persistent home readings that are accurate.  RTC precautions.  No other med changes for now.  Encounter for screening mammogram for malignant neoplasm of breast - Plan: MM Digital Screening  -Referral placed for mammogram discount program.  Hyperlipidemia, unspecified hyperlipidemia type - Plan: Comprehensive metabolic panel, Lipid panel  -Mild elevation previously, repeat labs.  Commended on weight loss and dietary changes.  Gastroesophageal reflux disease  -Rare need of PPI, continue same.  Covid vaccine recommended, declined at this time, also declined HIV/hepatitis C testing as well as Tdap at this time.    Meds ordered this encounter  Medications   metoprolol tartrate (LOPRESSOR) 100 MG tablet    Sig: Take 1 tablet (100 mg total) by mouth daily.    Dispense:  90 tablet    Refill:  1   losartan (COZAAR) 100 MG tablet    Sig: Take 1 tablet (100 mg total) by mouth daily.    Dispense:  90 tablet    Refill:  1   hydrochlorothiazide (HYDRODIURIL) 25 MG tablet  Sig: Take 1 tablet (25 mg total) by  mouth daily.    Dispense:  90 tablet    Refill:  1   amLODipine (NORVASC) 5 MG tablet    Sig: Take 1 tablet (5 mg total) by mouth daily.    Dispense:  90 tablet    Refill:  1   Patient Instructions    Keep a record of your blood pressures outside of the office and bring them to the next office visit. See info on how to check BP most effectively.  Bring meter for nurse visit in next week to see if meter is accurate.  Restart metoprolol at once per day for now, but we have option of twice daily if persistent high home readings.   Return to the clinic or go to the nearest emergency room if any of your symptoms worsen or new symptoms occur.  Return in 6 months for a physical. Take care!  If you have lab work done today you will be contacted with your lab results within the next 2 weeks.  If you have not heard from [place] then please contact us. The fastest way to get your results is to register for My Chart.   IF you received an x-ray today, you will receive an invoice from [facility]. Please contact [facility] at [phone] with questions or concerns regarding your invoice.   IF you received labwork today, you will receive an invoice from [place]. Please contact [facility] at [phone] with questions or concerns regarding your invoice.   Our billing staff will not be able to assist you with questions regarding bills from these companies.  You will be contacted with the lab results as soon as they are available. The fastest way to get your results is to activate your My Chart account. Instructions are located on the last page of this paperwork. If you have not heard from [place] regarding the results in 2 weeks, please contact this office.         Signed, Meredith Staggers, MD [facility]

## 2019-12-29 LAB — LIPID PANEL
Chol/HDL Ratio: 2.6 ratio (ref 0.0–4.4)
Cholesterol, Total: 184 mg/dL (ref 100–199)
HDL: 72 mg/dL (ref >39)
LDL Chol Calc (NIH): 98 mg/dL (ref 0–99)
Triglycerides: 78 mg/dL (ref 0–149)
VLDL Cholesterol Cal: 14 mg/dL (ref 5–40)

## 2019-12-29 LAB — COMPREHENSIVE METABOLIC PANEL
ALT: 27 IU/L (ref 0–32)
AST: 28 IU/L (ref 0–40)
Albumin/Globulin Ratio: 1.3 (ref 1.2–2.2)
Albumin: 4.4 g/dL (ref 3.8–4.8)
Alkaline Phosphatase: 83 IU/L (ref 48–121)
BUN/Creatinine Ratio: 13 (ref 12–28)
BUN: 11 mg/dL (ref 8–27)
Bilirubin Total: 0.4 mg/dL (ref 0.0–1.2)
CO2: 26 mmol/L (ref 20–29)
Calcium: 10.2 mg/dL (ref 8.7–10.3)
Chloride: 89 mmol/L — ABNORMAL LOW (ref 96–106)
Creatinine, Ser: 0.83 mg/dL (ref 0.57–1.00)
GFR calc Af Amer: 87 mL/min/{1.73_m2} (ref >59)
GFR calc non Af Amer: 75 mL/min/{1.73_m2} (ref >59)
Globulin, Total: 3.3 g/dL (ref 1.5–4.5)
Glucose: 101 mg/dL — ABNORMAL HIGH (ref 65–99)
Potassium: 3.9 mmol/L (ref 3.5–5.2)
Sodium: 132 mmol/L — ABNORMAL LOW (ref 134–144)
Total Protein: 7.7 g/dL (ref 6.0–8.5)

## 2019-12-31 ENCOUNTER — Telehealth: Payer: Self-pay | Admitting: Family Medicine

## 2019-12-31 NOTE — Telephone Encounter (Signed)
Patient was informed med was sent to the pharmacy

## 2019-12-31 NOTE — Telephone Encounter (Signed)
Pt called and stated she hasn't seen her medication was sent in from her visit on 12/28/19. Let pt know that it looks like they were sent in on Friday but we can re send them. Told pt to contact pharmacy as well.   amLODipine (NORVASC) 5 MG tablet [[id number]]  hydrochlorothiazide (HYDRODIURIL) 25 MG tablet [[id number]]   losartan (COZAAR) 100 MG tablet [[id number]  metoprolol tartrate (LOPRESSOR) 100 MG tablet [[id number]]    Pt also called about her referral that was put in on 12/28/19. Pt is wondering about that, did not see a referral put in yet. Please advise.

## 2020-01-01 ENCOUNTER — Encounter: Payer: Self-pay | Admitting: Family Medicine

## 2020-01-08 ENCOUNTER — Other Ambulatory Visit: Payer: Self-pay | Admitting: Family Medicine

## 2020-01-08 DIAGNOSIS — E871 Hypo-osmolality and hyponatremia: Secondary | ICD-10-CM

## 2020-01-08 NOTE — Progress Notes (Signed)
bmpb

## 2020-01-25 ENCOUNTER — Ambulatory Visit: Payer: PRIVATE HEALTH INSURANCE

## 2020-01-26 LAB — HM MAMMOGRAPHY

## 2020-01-27 ENCOUNTER — Telehealth: Payer: Self-pay | Admitting: Unknown Physician Specialty

## 2020-01-27 ENCOUNTER — Telehealth (HOSPITAL_COMMUNITY): Payer: Self-pay | Admitting: Oncology

## 2020-01-27 ENCOUNTER — Other Ambulatory Visit: Payer: Self-pay | Admitting: Unknown Physician Specialty

## 2020-01-27 DIAGNOSIS — I10 Essential (primary) hypertension: Secondary | ICD-10-CM

## 2020-01-27 DIAGNOSIS — U071 COVID-19: Secondary | ICD-10-CM

## 2020-01-27 NOTE — Telephone Encounter (Signed)
I connected by phone with Erika Wolf on 01/27/2020 at 4:59 PM to discuss the potential use of a new treatment for mild to moderate COVID-19 viral infection in non-hospitalized patients.  This patient is a 64 y.o. female that meets the FDA criteria for Emergency Use Authorization of COVID monoclonal antibody casirivimab/imdevimab.  Has a (+) direct SARS-CoV-2 viral test result  Has mild or moderate COVID-19   Is NOT hospitalized due to COVID-19  Is within 10 days of symptom onset  Has at least one of the high risk factor(s) for progression to severe COVID-19 and/or hospitalization as defined in EUA.  Specific high risk criteria : BMI > 25 and Cardiovascular disease or hypertension   I have spoken and communicated the following to the patient or parent/caregiver regarding COVID monoclonal antibody treatment:  1. FDA has authorized the emergency use for the treatment of mild to moderate COVID-19 in adults and pediatric patients with positive results of direct SARS-CoV-2 viral testing who are 87 years of age and older weighing at least 40 kg, and who are at high risk for progressing to severe COVID-19 and/or hospitalization.  2. The significant known and potential risks and benefits of COVID monoclonal antibody, and the extent to which such potential risks and benefits are unknown.  3. Information on available alternative treatments and the risks and benefits of those alternatives, including clinical trials.  4. Patients treated with COVID monoclonal antibody should continue to self-isolate and use infection control measures (e.g., wear mask, isolate, social distance, avoid sharing personal items, clean and disinfect "high touch" surfaces, and frequent handwashing) according to CDC guidelines.   5. The patient or parent/caregiver has the option to accept or refuse COVID monoclonal antibody treatment.  After reviewing this information with the patient, The patient agreed to proceed with  receiving casirivimab\imdevimab infusion and will be provided a copy of the Fact sheet prior to receiving the infusion. Gabriel Cirri 01/27/2020 4:59 PM  Sx onset 9/10

## 2020-01-27 NOTE — Telephone Encounter (Signed)
Called to Discuss with patient about Covid symptoms and the use of regeneron, a monoclonal antibody infusion for those with mild to moderate Covid symptoms and at a high risk of hospitalization.     Pt is qualified for this infusion at the [facility] infusion center due to co-morbid conditions and/or a member of an at-risk group.     Unable to reach pt. Left message to return call  Jenny Bassheva Flury , AGNP-C [phone] (Infusion Center Hotline)  

## 2020-01-29 ENCOUNTER — Ambulatory Visit (HOSPITAL_COMMUNITY)
Admission: RE | Admit: 2020-01-29 | Discharge: 2020-01-29 | Disposition: A | Discharge status: Home / Self Care | Payer: No Typology Code available for payment source | Source: Ambulatory Visit | Attending: Pulmonary Disease | Admitting: Pulmonary Disease

## 2020-01-29 DIAGNOSIS — I10 Essential (primary) hypertension: Secondary | ICD-10-CM | POA: Insufficient documentation

## 2020-01-29 DIAGNOSIS — U071 COVID-19: Secondary | ICD-10-CM | POA: Diagnosis present

## 2020-01-29 MED ORDER — EPINEPHRINE 0.3 MG/0.3ML IJ SOAJ: 0.3000 mg | Freq: Once | INTRAMUSCULAR | Status: DC | PRN

## 2020-01-29 MED ORDER — SODIUM CHLORIDE 0.9 % IV SOLN: INTRAVENOUS | Status: DC | PRN

## 2020-01-29 MED ORDER — METHYLPREDNISOLONE SODIUM SUCC 125 MG IJ SOLR: 125.0000 mg | Freq: Once | INTRAMUSCULAR | Status: DC | PRN

## 2020-01-29 MED ORDER — DIPHENHYDRAMINE HCL 50 MG/ML IJ SOLN: 50.0000 mg | Freq: Once | INTRAMUSCULAR | Status: DC | PRN

## 2020-01-29 MED ORDER — FAMOTIDINE IN NACL 20-0.9 MG/50ML-% IV SOLN: 20.0000 mg | Freq: Once | INTRAVENOUS | Status: DC | PRN

## 2020-01-29 MED ORDER — SODIUM CHLORIDE 0.9 % IV SOLN
1200.0000 mg | Freq: Once | INTRAVENOUS | Status: AC
  Administered 2020-01-29: 1200 mg via INTRAVENOUS
  Filled 2020-01-29: qty 10

## 2020-01-29 MED ORDER — ALBUTEROL SULFATE HFA 108 (90 BASE) MCG/ACT IN AERS: 2.0000 | INHALATION_SPRAY | Freq: Once | RESPIRATORY_TRACT | Status: DC | PRN

## 2020-01-29 NOTE — Progress Notes (Signed)
  Diagnosis: COVID-19  Physician:Dr Wright  Procedure: Covid Infusion Clinic Med: casirivimab\imdevimab infusion - Provided patient with casirivimab\imdevimab fact sheet for patients, parents and caregivers prior to infusion.  Complications: No immediate complications noted.  Discharge: Discharged home   Erika Wolf 01/29/2020  

## 2020-01-29 NOTE — Discharge Instructions (Signed)

## 2020-02-22 ENCOUNTER — Ambulatory Visit: Payer: PRIVATE HEALTH INSURANCE | Admitting: Family Medicine

## 2020-04-18 ENCOUNTER — Other Ambulatory Visit: Payer: Self-pay

## 2020-04-18 DIAGNOSIS — Z5181 Encounter for therapeutic drug level monitoring: Secondary | ICD-10-CM

## 2020-04-18 MED ORDER — VENLAFAXINE HCL ER 75 MG PO CP24: 75.0000 mg | ORAL_CAPSULE | Freq: Every day | ORAL | 0 refills | Status: DC

## 2020-04-18 NOTE — Telephone Encounter (Signed)
Patient is requesting refill on effexor. Aex scheduled for Monday 12/6.

## 2020-04-18 NOTE — Telephone Encounter (Signed)
Medication refill request: Effexor 75mg   Last AEX:  12/27/18 Next AEX: 04/21/20 Last MMG (if hormonal medication request): NA Refill authorized: 90/0

## 2020-04-21 ENCOUNTER — Other Ambulatory Visit: Payer: Self-pay

## 2020-04-21 ENCOUNTER — Ambulatory Visit (INDEPENDENT_AMBULATORY_CARE_PROVIDER_SITE_OTHER): Payer: PRIVATE HEALTH INSURANCE | Admitting: Obstetrics and Gynecology

## 2020-04-21 ENCOUNTER — Encounter: Payer: Self-pay | Admitting: Obstetrics and Gynecology

## 2020-04-21 VITALS — BP 138/74 | HR 72 | Ht 61.0 in | Wt 190.0 lb

## 2020-04-21 DIAGNOSIS — Z01419 Encounter for gynecological examination (general) (routine) without abnormal findings: Secondary | ICD-10-CM

## 2020-04-21 DIAGNOSIS — Z5181 Encounter for therapeutic drug level monitoring: Secondary | ICD-10-CM | POA: Diagnosis not present

## 2020-04-21 DIAGNOSIS — L659 Nonscarring hair loss, unspecified: Secondary | ICD-10-CM | POA: Diagnosis not present

## 2020-04-21 MED ORDER — VENLAFAXINE HCL ER 75 MG PO CP24: 75.0000 mg | ORAL_CAPSULE | Freq: Every day | ORAL | 3 refills | Status: DC

## 2020-04-21 MED ORDER — ESTRADIOL 10 MCG VA TABS: 1.0000 | ORAL_TABLET | VAGINAL | 3 refills | Status: DC

## 2020-04-21 NOTE — Patient Instructions (Signed)

## 2020-04-21 NOTE — Progress Notes (Signed)
64 y.o. [id number] Married Caucasian female here for annual exam.  Patient complaining of vaginal dryness, dyspareunia and hair loss.   Having bleeding with intercourse.  Cooking oil helps somewhat.  Decreased libido.   Stopped estrogen in July, 2019.   On Effexor for menopausal symptoms.   Having daily loss of urine out of the blue.  No specific relationship to to cough or sneeze.  Declines treatment.   Good bowel function.   Has her 10th grandbaby!  Had Covid.  Declines vaccination for Covid and flu.  PCP: Meredith Staggers MD  No LMP recorded. Patient has had a hysterectomy.           Sexually active: Yes.    The current method of family planning is status post hysterectomy.    Exercising: No.  The patient does not participate in regular exercise at present. Smoker:  no  Health Maintenance: Pap: 2015 Neg History of abnormal Pap:  no MMG: 3D/Neg/density A/BiRads1--[facility], 01/25/20.  Colonoscopy: 2020 normal except for diverticulosis;due to family hx colon CA-dad BMD: 03-30-11  Result :?Abnormal.  She declines BMD. TDaP:  PCP Gardasil:   no HIV: no Hep C: no Screening Labs:  PCP.    reports that she has never smoked. She has never used smokeless tobacco. She reports current alcohol use of about 1.0 standard drink of alcohol per week. She reports that she does not use drugs.  Past Medical History:  Diagnosis Date  . HTN (hypertension)   . Urinary incontinence    cough, sneeze    Past Surgical History:  Procedure Laterality Date  . CESAREAN SECTION  5 total  . CHOLECYSTECTOMY    . GALLBLADDER SURGERY    . ROTATOR CUFF REPAIR    . TOTAL ABDOMINAL HYSTERECTOMY  2005   Fibroid ovaries and tubes removed also  . TOTAL HIP ARTHROPLASTY Right 10/18/2018   Procedure: TOTAL HIP ARTHROPLASTY ANTERIOR APPROACH;  Surgeon: Ollen Gross, MD;  Location: [facility];  Service: Orthopedics;  Laterality: Right;     Current Outpatient Medications  Medication Sig Dispense  Refill  . amLODipine (NORVASC) 5 MG tablet Take 1 tablet (5 mg total) by mouth daily. 90 tablet 1  . calcium-vitamin D (OSCAL WITH D) 500-200 MG-UNIT TABS tablet Take by mouth.    . cetirizine (ZYRTEC) 10 MG tablet Take 10 mg by mouth daily.    . fluticasone (KLS ALLER-FLO) 50 MCG/ACT nasal spray Place 1 Dose into the nose daily.    . hydrochlorothiazide (HYDRODIURIL) 25 MG tablet Take 1 tablet (25 mg total) by mouth daily. 90 tablet 1  . losartan (COZAAR) 100 MG tablet Take 1 tablet (100 mg total) by mouth daily. 90 tablet 1  . metoprolol tartrate (LOPRESSOR) 100 MG tablet Take 1 tablet (100 mg total) by mouth daily. 90 tablet 1  . naproxen sodium (ALEVE) 220 MG tablet Take 220 mg by mouth.    Marland Kitchen omeprazole (PRILOSEC) 20 MG capsule Take 1 capsule (20 mg total) by mouth daily. 90 capsule 1  . venlafaxine XR (EFFEXOR XR) 75 MG 24 hr capsule Take 1 capsule (75 mg total) by mouth daily with breakfast. 30 capsule 0   No current facility-administered medications for this visit.    Family History  Problem Relation Age of Onset  . Hyperlipidemia Mother   . Hypertension Mother   . Dementia Mother   . Cancer Father        colon  . Aneurysm Father        brain  .  Cancer Sister        ovarian  . Hyperlipidemia Brother   . Breast cancer Daughter     Review of Systems  Skin:       Hair loss  All other systems reviewed and are negative.   Exam:   BP 138/74 (Cuff Size: Large)   Pulse 72   Ht 5\' 1"  (1.549 m)   Wt 190 lb (86.2 kg)   SpO2 97%   BMI 35.90 kg/m     General appearance: alert, cooperative and appears stated age Head: normocephalic, without obvious abnormality, atraumatic Neck: no adenopathy, supple, symmetrical, trachea midline and thyroid normal to inspection and palpation Lungs: clear to auscultation bilaterally Breasts: normal appearance, no masses or tenderness, No nipple retraction or dimpling, No nipple discharge or bleeding, No axillary adenopathy Heart: regular rate  and rhythm Abdomen: soft, non-tender; no masses, no organomegaly Extremities: extremities normal, atraumatic, no cyanosis or edema Skin: skin color, texture, turgor normal. No rashes or lesions Lymph nodes: cervical, supraclavicular, and axillary nodes normal. Neurologic: grossly normal  Pelvic: External genitalia:  no lesions              No abnormal inguinal nodes palpated.              Urethra:  normal appearing urethra with no masses, tenderness or lesions              Bartholins and Skenes: normal                 Vagina:  Atrophy noted with erythema and orange discharge.              Cervix: absent              Pap taken: No. Bimanual Exam:  Uterus:  absent              Adnexa: no mass, fullness, tenderness              Rectal exam: Yes.  .  Confirms.              Anus:  normal sphincter tone, no lesions  Chaperone was present for exam.  Assessment:   Well woman visit with normal exam. Status post TAH/BSO.  Urinary incontinence.  FH breast, ovarian cancer, and colon cancer. Sister had ovarian cancer. Daughter had breast cancer and had negative genetic testing.  Vaginal atrophy.  Hair loss.   Plan: Mammogram screening discussed. Self breast awareness reviewed. Pap and HR HPV as above. Guidelines for Calcium, Vitamin D, regular exercise program including cardiovascular and weight bearing exercise. Start Vagifem.  Instructed in use.  I discussed potential effect on breast cancer.  Refill of Effexor for one year.  We discussed risks of restarting ERT - stroke, PE, DVT.  Referral to dermatology. Follow up annually and prn.

## 2020-06-18 ENCOUNTER — Encounter: Payer: Self-pay | Admitting: Family Medicine

## 2020-06-18 ENCOUNTER — Other Ambulatory Visit: Payer: Self-pay

## 2020-06-18 ENCOUNTER — Telehealth (INDEPENDENT_AMBULATORY_CARE_PROVIDER_SITE_OTHER): Payer: PRIVATE HEALTH INSURANCE | Admitting: Family Medicine

## 2020-06-18 VITALS — Ht 61.0 in | Wt 185.0 lb

## 2020-06-18 DIAGNOSIS — R059 Cough, unspecified: Secondary | ICD-10-CM | POA: Diagnosis not present

## 2020-06-18 DIAGNOSIS — R0600 Dyspnea, unspecified: Secondary | ICD-10-CM

## 2020-06-18 DIAGNOSIS — R0609 Other forms of dyspnea: Secondary | ICD-10-CM

## 2020-06-18 MED ORDER — AZITHROMYCIN 250 MG PO TABS: ORAL_TABLET | ORAL | 0 refills | Status: DC

## 2020-06-18 MED ORDER — BENZONATATE 200 MG PO CAPS: 200.0000 mg | ORAL_CAPSULE | Freq: Two times a day (BID) | ORAL | 0 refills | Status: DC | PRN

## 2020-06-18 NOTE — Progress Notes (Signed)
Virtual Visit via Telephone Note  I connected with Erika Wolf on 06/18/20 at 1:48 PM by telephone and verified that I am speaking with the correct person using two identifiers. Patient location: home  My location: office   I discussed the limitations, risks, security and privacy concerns of performing an evaluation and management service by telephone and the availability of in person appointments. I also discussed with the patient that there may be a patient responsible charge related to this service. The patient expressed understanding and agreed to proceed, consent obtained  Chief complaint: Chief Complaint  Patient presents with  . Cough    PT reports cough started around 05/10/2020. Pt reports Some congestion and SOB as well. Pt hasn't had a covid test recently . Pt reports these symptoms have been going on since 05/10/2020. Pt reports not being vaccinated. Pt took Augmentin for 7 days 2 and 1/2 weeks ago. Pt states she noticed no change.     History of Present Illness: Erika Wolf is a 65 y.o. female  Cough: Initially noted around 12/25 - some sort of virus. Cough has continued since that time.  More cough past few weeks. Persistent cough, waking up at night. Clear phlegm.  No fever.  Dyspnea with walking distance past week. No new leg swelling.  Hurts to cough, but no chest pain outside of coughing.  No calf pain/swelling.  teledoc visit through [facility] 2.5 weeks ago. Yellow mucus then.rx augmentin - finished about a week and a half ago.  Some sinus pain, no pain. No headache.  Suspected covid 19 infection exposure around Christmas. Did not test. DTR was positive.   Pulse ox about 92%. No lower.   Tx: benadryl at night. mucinex DM.    covid 19 infection in 01/2020, treated with monoclonal Ab at that time. No covid vaccination.      Patient Active Problem List   Diagnosis Date Noted  . Diverticulitis 03/18/2019  . History of revision of total replacement of  right hip joint 10/30/2018  . OA (osteoarthritis) of hip 10/18/2018  . History of hematuria 12/27/2013  . Essential hypertension 12/27/2013  . Surgical menopause on hormone replacement therapy 12/27/2013  . Obesity (BMI 30.0-34.9) 12/27/2013  . 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 HTN (hypertension)   . Urinary incontinence    cough, sneeze   Past Surgical History:  Procedure Laterality Date  . CESAREAN SECTION  5 total  . CHOLECYSTECTOMY    . GALLBLADDER SURGERY    . ROTATOR CUFF REPAIR    . TOTAL ABDOMINAL HYSTERECTOMY  2005   Fibroid ovaries and tubes removed also  . TOTAL HIP ARTHROPLASTY Right 10/18/2018   Procedure: TOTAL HIP ARTHROPLASTY ANTERIOR APPROACH;  Surgeon: Ollen Gross, MD;  Location: [facility];  Service: Orthopedics;  Laterality: Right;    No Known Allergies Prior to Admission medications   Medication Sig Start Date End Date Taking? Authorizing Provider  amLODipine (NORVASC) 5 MG tablet Take 1 tablet (5 mg total) by mouth daily. 12/28/19  Yes Shade Flood, MD  calcium-vitamin D (OSCAL WITH D) 500-200 MG-UNIT TABS tablet Take by mouth.   Yes [provider]  cetirizine (ZYRTEC) 10 MG tablet Take 10 mg by mouth daily.   Yes [provider]  Estradiol (VAGIFEM) 10 MCG TABS vaginal tablet Place 1 tablet (10 mcg total) vaginally 2 (two) times a week. Use every night before bed for two weeks when you first begin  this medicine, then after the first two weeks, begin using it twice a week. 04/21/20  Yes Amundson Shirley Friar, MD  fluticasone (FLONASE) 50 MCG/ACT nasal spray Place 1 Dose into the nose daily.   Yes [provider]  hydrochlorothiazide (HYDRODIURIL) 25 MG tablet Take 1 tablet (25 mg total) by mouth daily. 12/28/19  Yes Shade Flood, MD  losartan (COZAAR) 100 MG tablet Take 1 tablet (100 mg total) by mouth daily. 12/28/19  Yes Shade Flood, MD  metoprolol tartrate  (LOPRESSOR) 100 MG tablet Take 1 tablet (100 mg total) by mouth daily. 12/28/19  Yes Shade Flood, MD  naproxen sodium (ALEVE) 220 MG tablet Take 220 mg by mouth.   Yes [provider]  omeprazole (PRILOSEC) 20 MG capsule Take 1 capsule (20 mg total) by mouth daily. 08/24/19  Yes Shade Flood, MD  venlafaxine XR (EFFEXOR XR) 75 MG 24 hr capsule Take 1 capsule (75 mg total) by mouth daily with breakfast. 04/21/20  Yes Patton Salles, MD   Social History   Socioeconomic History  . Marital status: Married    Spouse name: Not on file  . Number of children: Not on file  . Years of education: Not on file  . Highest education level: Not on file  Occupational History  . Occupation: homemaker  Tobacco Use  . Smoking status: Never Smoker  . Smokeless tobacco: Never Used  Vaping Use  . Vaping Use: Never used  Substance and Sexual Activity  . Alcohol use: Yes    Alcohol/week: 1.0 standard drink    Types: 1 Glasses of wine per week  . Drug use: No  . Sexual activity: Yes    Birth control/protection: Surgical    Comment: hysterectomy  Other Topics Concern  . Not on file  Social History Narrative   Married;   5 children, 4 live in [place], 1 lives in [place].   2 grandchildren         Social Determinants of Corporate investment banker Strain: Not on file  Food Insecurity: Not on file  Transportation Needs: Not on file  Physical Activity: Not on file  Stress: Not on file  Social Connections: Not on file  Intimate Partner Violence: Not on file     Observations/Objective:Marland Kitchen Vitals:   06/18/20 1025  Weight: 185 lb (83.9 kg)  Height: 5\' 1"  (1.549 m)     Assessment and Plan: Cough - Plan: COVID-19, Flu A+B and RSV, DG Chest 2 View, azithromycin (ZITHROMAX) 250 MG tablet, benzonatate (TESSALON) 200 MG capsule  DOE (dyspnea on exertion) - Plan: DG Chest 2 View  -Prior COVID-19 infection in September 2021.  Exposure to COVID-19 over Christmas time with  persistent cough, some recent worsening of cough for past 2 weeks including after use of Augmentin.  Some dyspnea on exertion but not at rest.  O2 sat has remained above 90.  Differential includes bronchitis versus COVID-19 with pneumonia.    - Covid testing, flu, RSV testing as above.  -Try higher dose of Tessalon Perles, continue Mucinex DM, fluids, rest  -Some to cover atypicals.  Chest x-ray today.  ER precautions given.  2-day follow-up, consider respiratory clinic eval.  Follow Up Instructions:    Patient Instructions  Try higher dose of Tessalon Perles up to 3 times per day, okay to continue Mucinex DM.  I did send a different antibiotic to your pharmacy that may cover different infection.  Please have x-ray  done of the chest today, and depending on those results can decide on different changes.  Monitor your oxygen levels and if those drop below 90 or you have any increasing shortness of breath including at rest, confusion, disorientation or other acute worsening be seen in the emergency room.  If you are stable, or improving, follow-up with me in 2 days.   I discussed the assessment and treatment plan with the patient. The patient was provided an opportunity to ask questions and all were answered. The patient agreed with the plan and demonstrated an understanding of the instructions.   The patient was advised to call back or seek an in-person evaluation if the symptoms worsen or if the condition fails to improve as anticipated.  I provided 14 minutes of non-face-to-face time during this encounter.  Signed,   Meredith Staggers, MD Primary Care at [facility].  06/18/20

## 2020-06-18 NOTE — Patient Instructions (Signed)
Try higher dose of Tessalon Perles up to 3 times per day, okay to continue Mucinex DM.  I did send a different antibiotic to your pharmacy that may cover different infection.  Please have x-ray done of the chest today, and depending on those results can decide on different changes.  Monitor your oxygen levels and if those drop below 90 or you have any increasing shortness of breath including at rest, confusion, disorientation or other acute worsening be seen in the emergency room.  If you are stable, or improving, follow-up with me in 2 days.

## 2020-06-19 LAB — COVID-19, FLU A+B AND RSV
Influenza A, NAA: NOT DETECTED
Influenza B, NAA: NOT DETECTED
RSV, NAA: NOT DETECTED
SARS-CoV-2, NAA: NOT DETECTED

## 2020-06-20 ENCOUNTER — Ambulatory Visit
Admission: RE | Admit: 2020-06-20 | Discharge: 2020-06-20 | Disposition: A | Discharge status: Home / Self Care | Payer: No Typology Code available for payment source | Source: Ambulatory Visit | Attending: Family Medicine | Admitting: Family Medicine

## 2020-06-20 ENCOUNTER — Telehealth (INDEPENDENT_AMBULATORY_CARE_PROVIDER_SITE_OTHER): Payer: PRIVATE HEALTH INSURANCE | Admitting: Family Medicine

## 2020-06-20 ENCOUNTER — Other Ambulatory Visit: Payer: Self-pay

## 2020-06-20 VITALS — Ht 61.0 in | Wt 185.0 lb

## 2020-06-20 DIAGNOSIS — R059 Cough, unspecified: Secondary | ICD-10-CM | POA: Diagnosis not present

## 2020-06-20 DIAGNOSIS — R0609 Other forms of dyspnea: Secondary | ICD-10-CM

## 2020-06-20 DIAGNOSIS — R0600 Dyspnea, unspecified: Secondary | ICD-10-CM

## 2020-06-20 MED ORDER — HYDROCODONE-HOMATROPINE 5-1.5 MG/5ML PO SYRP: 5.0000 mL | ORAL_SOLUTION | Freq: Three times a day (TID) | ORAL | 0 refills | Status: DC | PRN

## 2020-06-20 NOTE — Patient Instructions (Signed)
° ° ° °  If you have lab work done today you will be contacted with your lab results within the next 2 weeks.  If you have not heard from us then please contact us. The fastest way to get your results is to register for My Chart. ° ° °IF you received an x-ray today, you will receive an invoice from [facility]. Please contact [facility] at [phone] with questions or concerns regarding your invoice.  ° °IF you received labwork today, you will receive an invoice from [facility]. Please contact [facility] at [phone] with questions or concerns regarding your invoice.  ° °Our billing staff will not be able to assist you with questions regarding bills from these companies. ° °You will be contacted with the lab results as soon as they are available. The fastest way to get your results is to activate your My Chart account. Instructions are located on the last page of this paperwork. If you have not heard from us regarding the results in 2 weeks, please contact this office. °  ° ° ° °

## 2020-06-20 NOTE — Progress Notes (Signed)
Virtual Visit via Telephone Note  I connected with Erika Wolf on 06/20/20 at 12:54 PM by telephone and verified that I am speaking with the correct person using two identifiers. Patient location:home My location: office   I discussed the limitations, risks, security and privacy concerns of performing an evaluation and management service by telephone and the availability of in person appointments. I also discussed with the patient that there may be a patient responsible charge related to this service. The patient expressed understanding and agreed to proceed, consent obtained  Chief complaint: Chief Complaint  Patient presents with  . Follow-up    On cough from last OV. Pt reports feeling a little better, but still coughing a lot.    History of Present Illness: Erika Wolf is a 65 y.o. female  Cough: Follow up from virtual visit 2 days ago. Cough improving, not out of the woods. Some dry, some phlegm with cough.  No fever. Unable to have CXR until negtaive covid testing. Had negative covid, flu,rsv testing on 06/18/20.  No dyspnea, still some cough fits. Taking azithromycin- no new side effects. Day #3/5.  Still taking mucinex DM.  Tinge of red in mucus this am only. No discolored phlegm.  No wheezing past few days. Min change in cough with higher tessalon perles.  Cough still during the night.     Patient Active Problem List   Diagnosis Date Noted  . Diverticulitis 03/18/2019  . History of revision of total replacement of right hip joint 10/30/2018  . OA (osteoarthritis) of hip 10/18/2018  . History of hematuria 12/27/2013  . Essential hypertension 12/27/2013  . Surgical menopause on hormone replacement therapy 12/27/2013  . Obesity (BMI 30.0-34.9) 12/27/2013  . 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 HTN (hypertension)   . Hypertension    Phreesia 06/20/2020  . Sleep apnea    Phreesia 06/20/2020  . Urinary  incontinence    cough, sneeze   Past Surgical History:  Procedure Laterality Date  . CESAREAN SECTION  5 total  . CESAREAN SECTION N/A    Phreesia 06/20/2020  . CHOLECYSTECTOMY    . FRACTURE SURGERY N/A    Phreesia 06/20/2020  . GALLBLADDER SURGERY    . JOINT REPLACEMENT N/A    Phreesia 06/20/2020  . ROTATOR CUFF REPAIR    . TOTAL ABDOMINAL HYSTERECTOMY  2005   Fibroid ovaries and tubes removed also  . TOTAL HIP ARTHROPLASTY Right 10/18/2018   Procedure: TOTAL HIP ARTHROPLASTY ANTERIOR APPROACH;  Surgeon: Ollen Gross, MD;  Location: [facility];  Service: Orthopedics;  Laterality: Right;    No Known Allergies Prior to Admission medications   Medication Sig Start Date End Date Taking? Authorizing Provider  amLODipine (NORVASC) 5 MG tablet Take 1 tablet (5 mg total) by mouth daily. 12/28/19  Yes Shade Flood, MD  azithromycin (ZITHROMAX) 250 MG tablet Take 2 pills by mouth on day 1, then 1 pill by mouth per day on days 2 through 5. 06/18/20  Yes Shade Flood, MD  benzonatate (TESSALON) 200 MG capsule Take 1 capsule (200 mg total) by mouth 2 (two) times daily as needed for cough. 06/18/20  Yes Shade Flood, MD  calcium-vitamin D (OSCAL WITH D) 500-200 MG-UNIT TABS tablet Take by mouth.   Yes [provider]  cetirizine (ZYRTEC) 10 MG tablet Take 10 mg by mouth daily.   Yes [provider]  Estradiol (VAGIFEM) 10 MCG TABS vaginal tablet Place  1 tablet (10 mcg total) vaginally 2 (two) times a week. Use every night before bed for two weeks when you first begin this medicine, then after the first two weeks, begin using it twice a week. 04/21/20  Yes Amundson Shirley Friar, MD  fluticasone (FLONASE) 50 MCG/ACT nasal spray Place 1 Dose into the nose daily.   Yes [provider]  hydrochlorothiazide (HYDRODIURIL) 25 MG tablet Take 1 tablet (25 mg total) by mouth daily. 12/28/19  Yes Shade Flood, MD  losartan (COZAAR) 100 MG tablet Take 1 tablet (100  mg total) by mouth daily. 12/28/19  Yes Shade Flood, MD  metoprolol tartrate (LOPRESSOR) 100 MG tablet Take 1 tablet (100 mg total) by mouth daily. 12/28/19  Yes Shade Flood, MD  naproxen sodium (ALEVE) 220 MG tablet Take 220 mg by mouth.   Yes [provider]  omeprazole (PRILOSEC) 20 MG capsule Take 1 capsule (20 mg total) by mouth daily. 08/24/19  Yes Shade Flood, MD  venlafaxine XR (EFFEXOR XR) 75 MG 24 hr capsule Take 1 capsule (75 mg total) by mouth daily with breakfast. 04/21/20  Yes Patton Salles, MD   Social History   Socioeconomic History  . Marital status: Married    Spouse name: Not on file  . Number of children: Not on file  . Years of education: Not on file  . Highest education level: Not on file  Occupational History  . Occupation: homemaker  Tobacco Use  . Smoking status: Never Smoker  . Smokeless tobacco: Never Used  Vaping Use  . Vaping Use: Never used  Substance and Sexual Activity  . Alcohol use: Yes    Alcohol/week: 1.0 standard drink    Types: 1 Glasses of wine per week  . Drug use: No  . Sexual activity: Yes    Birth control/protection: Surgical    Comment: hysterectomy  Other Topics Concern  . Not on file  Social History Narrative   Married;   5 children, 4 live in [place], 1 lives in [place].   2 grandchildren         Social Determinants of Corporate investment banker Strain: Not on file  Food Insecurity: Not on file  Transportation Needs: Not on file  Physical Activity: Not on file  Stress: Not on file  Social Connections: Not on file  Intimate Partner Violence: Not on file     Observations/Objective: Vitals:   06/20/20 1150  Weight: 185 lb (83.9 kg)  Height: 5\' 1"  (1.549 m)  Speaking in full sentences, no respiratory distress, appropriate responses.  Few coughs during her history.  No audible wheeze/stridor.  All questions answered with understanding plan expressed.  Assessment and Plan: Cough -  Plan: HYDROcodone-homatropine (HYCODAN) 5-1.5 MG/5ML syrup Some improvement since starting azithromycin.  Minimal change in cough with Tessalon Perles.  Possible lower respiratory tract infection/pneumonia.    -Will check chest x-ray now she has negative Covid testing should be able to have that done at [facility].    -Hycodan cough syrup provided for bedtime, up to every 8 hours as needed.  May be getting into cyclical cough syndrome.  Mucinex DM discussed, other symptomatic care discussed  - urgent care/ER precautions if any worsening over the weekend with recheck in 5 days.  Follow Up Instructions: 5 days.    I discussed the assessment and treatment plan with the patient. The patient was provided an opportunity to ask questions and all  were answered. The patient agreed with the plan and demonstrated an understanding of the instructions.   The patient was advised to call back or seek an in-person evaluation if the symptoms worsen or if the condition fails to improve as anticipated.  I provided 10 minutes of non-face-to-face time during this encounter.  Signed,   Meredith Staggers, MD Primary Care at [facility].  06/20/20

## 2020-06-22 ENCOUNTER — Encounter: Payer: Self-pay | Admitting: Family Medicine

## 2020-06-27 ENCOUNTER — Encounter: Payer: Self-pay | Admitting: Family Medicine

## 2020-06-27 ENCOUNTER — Other Ambulatory Visit: Payer: Self-pay

## 2020-06-27 ENCOUNTER — Ambulatory Visit (INDEPENDENT_AMBULATORY_CARE_PROVIDER_SITE_OTHER): Payer: PRIVATE HEALTH INSURANCE | Admitting: Family Medicine

## 2020-06-27 VITALS — BP 130/86 | HR 74 | Temp 98.0°F | Ht 61.0 in | Wt 185.0 lb

## 2020-06-27 DIAGNOSIS — H6593 Unspecified nonsuppurative otitis media, bilateral: Secondary | ICD-10-CM

## 2020-06-27 DIAGNOSIS — I10 Essential (primary) hypertension: Secondary | ICD-10-CM | POA: Diagnosis not present

## 2020-06-27 DIAGNOSIS — Z0001 Encounter for general adult medical examination with abnormal findings: Secondary | ICD-10-CM

## 2020-06-27 DIAGNOSIS — Z0000 Encounter for general adult medical examination without abnormal findings: Secondary | ICD-10-CM

## 2020-06-27 DIAGNOSIS — R059 Cough, unspecified: Secondary | ICD-10-CM

## 2020-06-27 DIAGNOSIS — H9193 Unspecified hearing loss, bilateral: Secondary | ICD-10-CM | POA: Diagnosis not present

## 2020-06-27 DIAGNOSIS — E785 Hyperlipidemia, unspecified: Secondary | ICD-10-CM

## 2020-06-27 DIAGNOSIS — K219 Gastro-esophageal reflux disease without esophagitis: Secondary | ICD-10-CM

## 2020-06-27 MED ORDER — AMLODIPINE BESYLATE 5 MG PO TABS: 5.0000 mg | ORAL_TABLET | Freq: Every day | ORAL | 1 refills | Status: DC

## 2020-06-27 MED ORDER — HYDROCHLOROTHIAZIDE 25 MG PO TABS: 25.0000 mg | ORAL_TABLET | Freq: Every day | ORAL | 1 refills | Status: DC

## 2020-06-27 MED ORDER — LOSARTAN POTASSIUM 100 MG PO TABS: 100.0000 mg | ORAL_TABLET | Freq: Every day | ORAL | 1 refills | Status: DC

## 2020-06-27 MED ORDER — OMEPRAZOLE 20 MG PO CPDR: 20.0000 mg | DELAYED_RELEASE_CAPSULE | Freq: Every day | ORAL | 1 refills | Status: DC

## 2020-06-27 MED ORDER — METOPROLOL TARTRATE 100 MG PO TABS: 100.0000 mg | ORAL_TABLET | Freq: Every day | ORAL | 1 refills | Status: DC

## 2020-06-27 NOTE — Patient Instructions (Addendum)
Glad to hear that cough is better. If worsens, schedule follow up.  I do recommend eye exam.  Goal exercise of 150 minutes per week, spread out over most days.   Try flonase nasal spray 1-2 sprays in each nostril daily.  Afrin nasal spray 1-2 days to see if that will help ears.  I will refer you to ear specialist.  Return to the clinic or go to the nearest emergency room if any of your symptoms worsen or new symptoms occur.  Keeping You Healthy  Get These Tests  Blood Pressure- Have your blood pressure checked by your healthcare provider at least once a year.  Normal blood pressure is 120/80.  Weight- Have your body mass index (BMI) calculated to screen for obesity.  BMI is a measure of body fat based on height and weight.  You can calculate your own BMI at https://www.west-esparza.com/  Cholesterol- Have your cholesterol checked every year.  Diabetes- Have your blood sugar checked every year if you have high blood pressure, high cholesterol, a family history of diabetes or if you are overweight.  Pap Test - Have a pap test every 1 to 5 years if you have been sexually active.  If you are older than 65 and recent pap tests have been normal you may not need additional pap tests.  In addition, if you have had a hysterectomy  for benign disease additional pap tests are not necessary.  Mammogram-Yearly mammograms are essential for early detection of breast cancer  Screening for Colon Cancer- Colonoscopy starting at age 56. Screening may begin sooner depending on your family history and other health conditions.  Follow up colonoscopy as directed by your Gastroenterologist.  Screening for Osteoporosis- Screening begins at age 8 with bone density scanning, sooner if you are at higher risk for developing Osteoporosis.  Get these medicines  Calcium with Vitamin D- Your body requires 1200-1500 mg of Calcium a day and [phone] IU of Vitamin D a day.  You can only absorb 500 mg of Calcium at a time  therefore Calcium must be taken in 2 or 3 separate doses throughout the day.  Hormones- Hormone therapy has been associated with increased risk for certain cancers and heart disease.  Talk to your healthcare provider about if you need relief from menopausal symptoms.  Aspirin- Ask your healthcare provider about taking Aspirin to prevent Heart Disease and Stroke.  Get these Immuniztions  Flu shot- Every fall  Pneumonia shot- Once after the age of 68; if you are younger ask your healthcare provider if you need a pneumonia shot.  Tetanus- Every ten years.  Shingles vaccine - after the age of 45 to prevent shingles.  Take these steps  Don't smoke- Your healthcare provider can help you quit. For tips on how to quit, ask your healthcare provider or go to www.smokefree.gov or call 1-800 QUIT-NOW.  Be physically active- Exercise 5 days a week for a minimum of 30 minutes.  If you are not already physically active, start slow and gradually work up to 30 minutes of moderate physical activity.  Try walking, dancing, bike riding, swimming, etc.  Eat a healthy diet- Eat a variety of healthy foods such as fruits, vegetables, whole grains, low fat milk, low fat cheeses, yogurt, lean meats, chicken, fish, eggs, dried beans, tofu, etc.  For more information go to www.thenutritionsource.org  Dental visit- Brush and floss teeth twice daily; visit your dentist twice a year.  Eye exam- Visit your Optometrist or Ophthalmologist yearly.  Drink alcohol in moderation- Limit alcohol intake to one drink or less a day.  Never drink and drive.  Depression- Your emotional health is as important as your physical health.  If you're feeling down or losing interest in things you normally enjoy, please talk to your healthcare provider.  Seat Belts- can save your life; always wear one  Smoke/Carbon Monoxide detectors- These detectors need to be installed on the appropriate level of your home.  Replace batteries at  least once a year.  Violence- If anyone is threatening or hurting you, please tell your healthcare provider.  Living Will/ Health care power of attorney- Discuss with your healthcare provider and family.  If you have lab work done today you will be contacted with your lab results within the next 2 weeks.  If you have not heard from [place] then please contact us. The fastest way to get your results is to register for My Chart.   IF you received an x-ray today, you will receive an invoice from [facility]. Please contact [facility] at [phone] with questions or concerns regarding your invoice.   IF you received labwork today, you will receive an invoice from [place]. Please contact [facility] at [phone] with questions or concerns regarding your invoice.   Our billing staff will not be able to assist you with questions regarding bills from these companies.  You will be contacted with the lab results as soon as they are available. The fastest way to get your results is to activate your My Chart account. Instructions are located on the last page of this paperwork. If you have not heard from [place] regarding the results in 2 weeks, please contact this office.

## 2020-06-27 NOTE — Progress Notes (Signed)
Subjective:  Patient ID: Erika Wolf, female    DOB: June 07, 1955  Age: 65 y.o. MRN: [id number]  CC:  Chief Complaint  Patient presents with  . Annual Exam    Pt reports she feels well today, but reports some hearing loss in her ears.PT reports some pain in her L inner ear at night sometimes. No blood or drainage to reports from ears. Pt reports no issues with BP since last OV.    HPI Erika Wolf presents for   Annual physical exam, other concerns above  Hearing loss: Ears feel blocked past few weeks with recent illness. Chronic R ear hearing loss - no prior testing/eval. R ear about same, but left ear  Blocked past few weeks. Pain in left ear at times past few weeks. Not there currently- last felt 3 nights ago. Finished Zpak about 5 days ago. Has felt blocked in left ear past 2 weeks. No bleeding/discharge. No treatments.  No recent zyrtec, no nasal sprays. Has had nasal congestion past few weeks, clicking/popping noise in left ear at times.   Rare use of omeprazole for heartburn - doing well.   Cough: See recent telemedicine visits.  Negative COVID, flu, RSV testing February 2.  Azithromycin recently prescribed, day number 3 out of 5 on February 4.  Wheezing had improved.  Tessalon Perles with minimal change.  Cough at night.  Mucinex DM continued.Hycodan cough syrup provided for possible cyclical cough syndrome. Chest x-ray February 4 indicated hazy lingular airspace disease concerning for atelectasis but otherwise no acute cardiopulmonary disease. Has been feeling much better. Cough has nearly resolved. Only few times per day -no need for hycodan at this time.   Hypertension: HCTZ 25 mg daily, losartan 100 g daily, metoprolol 100 mg daily amlodipine 5 mg daily.  Metoprolol was restarted in August of last year.  Option of twice daily dosing. Taking once per day.  No new side effects.  Home readings:130/80. BP Readings from Last 3 Encounters:  06/27/20 130/86  04/21/20 138/74   01/29/20 130/76   Lab Results  Component Value Date   CREATININE 0.83 12/28/2019   Has been treated with Effexor 75 mg daily, prescribed by OB/GYN with postmenopausal symptoms. Working well.  Depression screen [facility] 2/9 06/27/2020 06/18/2020 12/28/2019 05/15/2019 04/06/2019  Decreased Interest 0 0 0 0 0  Down, Depressed, Hopeless 0 0 0 0 0  PHQ - 2 Score 0 0 0 0 0    Hyperlipidemia: Improved with diet/weight loss in August.  No current statin. Fasting.  Wt Readings from Last 3 Encounters:  06/27/20 185 lb (83.9 kg)  06/20/20 185 lb (83.9 kg)  06/18/20 185 lb (83.9 kg)    Lab Results  Component Value Date   CHOL 184 12/28/2019   HDL 72 12/28/2019   LDLCALC 98 12/28/2019   TRIG 78 12/28/2019   CHOLHDL 2.6 12/28/2019   Lab Results  Component Value Date   ALT 27 12/28/2019   AST 28 12/28/2019   ALKPHOS 83 12/28/2019   BILITOT 0.4 12/28/2019    Cancer screening: Colonoscopy 02/2019. Repeat 5 yrs.  Mammogram 01/2020. Pap in 2020- followed by OBGYN, Dr. Edward Wolf.   Immunization History  Administered Date(s) Administered  . Influenza,inj,quad, With Preservative 02/14/2018  declines covid vaccine, flu vaccine, tetanus.  Declines HIV, HEP C testing.   No exam data present No recent optho eval - recommended.   Dental: every 6 months.   Exercise: less with recent illness, prior walking 3 times per week.  History Patient Active Problem List   Diagnosis Date Noted  . Diverticulitis 03/18/2019  . History of revision of total replacement of right hip joint 10/30/2018  . OA (osteoarthritis) of hip 10/18/2018  . History of hematuria 12/27/2013  . Essential hypertension 12/27/2013  . Surgical menopause on hormone replacement therapy 12/27/2013  . Obesity (BMI 30.0-34.9) 12/27/2013  . 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 HTN (hypertension)   . Hypertension    Phreesia 06/20/2020  . Sleep apnea    Phreesia 06/20/2020   . Urinary incontinence    cough, sneeze   Past Surgical History:  Procedure Laterality Date  . CESAREAN SECTION  5 total  . CESAREAN SECTION N/A    Phreesia 06/20/2020  . CHOLECYSTECTOMY    . FRACTURE SURGERY N/A    [employer]reesia 06/20/2020  . GALLBLADDER SURGERY    . JOINT REPLACEMENT N/A    Phreesia 06/20/2020  . ROTATOR CUFF REPAIR    . TOTAL ABDOMINAL HYSTERECTOMY  2005   Fibroid ovaries and tubes removed also  . TOTAL HIP ARTHROPLASTY Right 10/18/2018   Procedure: TOTAL HIP ARTHROPLASTY ANTERIOR APPROACH;  Surgeon: Ollen Gross, MD;  Location: [facility];  Service: Orthopedics;  Laterality: Right;    No Known Allergies Prior to Admission medications   Medication Sig Start Date End Date Taking? Authorizing Provider  amLODipine (NORVASC) 5 MG tablet Take 1 tablet (5 mg total) by mouth daily. 12/28/19  Yes Shade Flood, MD  azithromycin (ZITHROMAX) 250 MG tablet Take 2 pills by mouth on day 1, then 1 pill by mouth per day on days 2 through 5. 06/18/20  Yes Shade Flood, MD  benzonatate (TESSALON) 200 MG capsule Take 1 capsule (200 mg total) by mouth 2 (two) times daily as needed for cough. 06/18/20  Yes Shade Flood, MD  calcium-vitamin D (OSCAL WITH D) 500-200 MG-UNIT TABS tablet Take by mouth.   Yes [provider]  cetirizine (ZYRTEC) 10 MG tablet Take 10 mg by mouth daily.   Yes [provider]  fluticasone (FLONASE) 50 MCG/ACT nasal spray Place 1 Dose into the nose daily.   Yes [provider]  hydrochlorothiazide (HYDRODIURIL) 25 MG tablet Take 1 tablet (25 mg total) by mouth daily. 12/28/19  Yes Shade Flood, MD  HYDROcodone-homatropine [facility]) 5-1.5 MG/5ML syrup Take 5 mLs by mouth every 8 (eight) hours as needed for cough (start at bedtime as needed). 06/20/20  Yes Shade Flood, MD  losartan (COZAAR) 100 MG tablet Take 1 tablet (100 mg total) by mouth daily. 12/28/19  Yes Shade Flood, MD  metoprolol tartrate (LOPRESSOR) 100  MG tablet Take 1 tablet (100 mg total) by mouth daily. 12/28/19  Yes Shade Flood, MD  naproxen sodium (ALEVE) 220 MG tablet Take 220 mg by mouth.   Yes [provider]  omeprazole (PRILOSEC) 20 MG capsule Take 1 capsule (20 mg total) by mouth daily. 08/24/19  Yes Shade Flood, MD  venlafaxine XR (EFFEXOR XR) 75 MG 24 hr capsule Take 1 capsule (75 mg total) by mouth daily with breakfast. 04/21/20  Yes Patton Salles, MD  Estradiol (VAGIFEM) 10 MCG TABS vaginal tablet Place 1 tablet (10 mcg total) vaginally 2 (two) times a week. Use every night before bed for two weeks when you first begin this medicine, then after the first two weeks, begin using it twice a week. 04/21/20   Ardell Isaacs, Brook  E, MD   Social History   Socioeconomic History  . Marital status: Married    Spouse name: Not on file  . Number of children: Not on file  . Years of education: Not on file  . Highest education level: Not on file  Occupational History  . Occupation: homemaker  Tobacco Use  . Smoking status: Never Smoker  . Smokeless tobacco: Never Used  Vaping Use  . Vaping Use: Never used  Substance and Sexual Activity  . Alcohol use: Yes    Alcohol/week: 1.0 standard drink    Types: 1 Glasses of wine per week  . Drug use: No  . Sexual activity: Yes    Birth control/protection: Surgical    Comment: hysterectomy  Other Topics Concern  . Not on file  Social History Narrative   Married;   5 children, 4 live in [place], 1 lives in [place].   2 grandchildren         Social Determinants of Corporate investment banker Strain: Not on file  Food Insecurity: Not on file  Transportation Needs: Not on file  Physical Activity: Not on file  Stress: Not on file  Social Connections: Not on file  Intimate Partner Violence: Not on file    Review of Systems 13 point review of systems per patient health survey noted.  Negative other than as indicated above or in HPI.    Objective:    Vitals:   06/27/20 1003 06/27/20 1007  BP: (!) 146/85 130/86  Pulse: 74   Temp: 98 F (36.7 C)   TempSrc: Temporal   SpO2: 96%   Weight: 185 lb (83.9 kg)   Height: 5\' 1"  (1.549 m)      Physical Exam Vitals reviewed.  Constitutional:      Appearance: She is well-developed and well-nourished.  HENT:     Head: Normocephalic and atraumatic.     Right Ear: External ear normal.     Left Ear: External ear normal.     Ears:     Comments: Patent canals bilaterally, TMs pearly gray with small effusion bilaterally clear to white.  No injection, or erythema of TM.  External ears nontender.    Mouth/Throat:     Mouth: Oropharynx is clear and moist.  Eyes:     Extraocular Movements: EOM normal.     Conjunctiva/sclera: Conjunctivae normal.     Pupils: Pupils are equal, round, and reactive to light.  Neck:     Thyroid: No thyromegaly.     Vascular: No carotid bruit.  Cardiovascular:     Rate and Rhythm: Normal rate and regular rhythm.     Pulses: Intact distal pulses.     Heart sounds: Normal heart sounds. No murmur heard.   Pulmonary:     Effort: Pulmonary effort is normal. No respiratory distress.     Breath sounds: Normal breath sounds. No stridor. No wheezing or rhonchi.  Abdominal:     General: Bowel sounds are normal.     Palpations: Abdomen is soft. There is no pulsatile mass.     Tenderness: There is no abdominal tenderness.  Musculoskeletal:        General: No tenderness or edema. Normal range of motion.     Cervical back: Normal range of motion and neck supple.  Lymphadenopathy:     Cervical: No cervical adenopathy.  Skin:    General: Skin is warm and dry.     Findings: No rash.  Neurological:     Mental  Status: She is alert and oriented to person, place, and time.  Psychiatric:        Mood and Affect: Mood and affect normal.        Behavior: Behavior normal.        Thought Content: Thought content normal.        Assessment & Plan:  Yalina Peagler is a  65 y.o. female . Annual physical exam  - -anticipatory guidance as below in AVS, screening labs above. Health maintenance items as above in HPI discussed/recommended as applicable.   Essential hypertension - Plan: amLODipine (NORVASC) 5 MG tablet, hydrochlorothiazide (HYDRODIURIL) 25 MG tablet, losartan (COZAAR) 100 MG tablet, metoprolol tartrate (LOPRESSOR) 100 MG tablet, Comprehensive metabolic panel  -  Stable, tolerating current regimen. Medications refilled. Labs pending as above.   Gastroesophageal reflux disease - Plan: omeprazole (PRILOSEC) 20 MG capsule  - stable. Continue intermittent PPI.   Hyperlipidemia, unspecified hyperlipidemia type - Plan: Lipid panel  - better last year, no meds at this time. Repeat labs.   Cough  - improved.  Lungs clear on exam, RTC precautions.  Fluid level behind tympanic membrane of both ears - Plan: Ambulatory referral to ENT Decreased hearing of both ears - Plan: Ambulatory referral to ENT  -Longstanding decreased hearing behind right ear.  No further symptoms with recent infection, suspect effusion.  Just finished azithromycin, today would be day 10 after starting medication.  No definite sign of infection at this time.  Possible component of eustachian tube dysfunction with sinus congestion.  Start Flonase, few days of Afrin, refer to ENT with RTC precautions.   Meds ordered this encounter  Medications  . amLODipine (NORVASC) 5 MG tablet    Sig: Take 1 tablet (5 mg total) by mouth daily.    Dispense:  90 tablet    Refill:  1  . hydrochlorothiazide (HYDRODIURIL) 25 MG tablet    Sig: Take 1 tablet (25 mg total) by mouth daily.    Dispense:  90 tablet    Refill:  1  . losartan (COZAAR) 100 MG tablet    Sig: Take 1 tablet (100 mg total) by mouth daily.    Dispense:  90 tablet    Refill:  1  . metoprolol tartrate (LOPRESSOR) 100 MG tablet    Sig: Take 1 tablet (100 mg total) by mouth daily.    Dispense:  90 tablet    Refill:  1  .  omeprazole (PRILOSEC) 20 MG capsule    Sig: Take 1 capsule (20 mg total) by mouth daily.    Dispense:  90 capsule    Refill:  1   Patient Instructions   Glad to hear that cough is better. If worsens, schedule follow up.  I do recommend eye exam.  Goal exercise of 150 minutes per week, spread out over most days.   Try flonase nasal spray 1-2 sprays in each nostril daily.  Afrin nasal spray 1-2 days to see if that will help ears.  I will refer you to ear specialist.  Return to the clinic or go to the nearest emergency room if any of your symptoms worsen or new symptoms occur.  Keeping You Healthy  Get These Tests  Blood Pressure- Have your blood pressure checked by your healthcare provider at least once a year.  Normal blood pressure is 120/80.  Weight- Have your body mass index (BMI) calculated to screen for obesity.  BMI is a measure of body fat based on height and weight.  You can calculate your own BMI at https://www.west-esparza.com/www.nhlbisupport.com/bmi/  Cholesterol- Have your cholesterol checked every year.  Diabetes- Have your blood sugar checked every year if you have high blood pressure, high cholesterol, a family history of diabetes or if you are overweight.  Pap Test - Have a pap test every 1 to 5 years if you have been sexually active.  If you are older than 65 and recent pap tests have been normal you may not need additional pap tests.  In addition, if you have had a hysterectomy  for benign disease additional pap tests are not necessary.  Mammogram-Yearly mammograms are essential for early detection of breast cancer  Screening for Colon Cancer- Colonoscopy starting at age 65. Screening may begin sooner depending on your family history and other health conditions.  Follow up colonoscopy as directed by your Gastroenterologist.  Screening for Osteoporosis- Screening begins at age 65 with bone density scanning, sooner if you are at higher risk for developing Osteoporosis.  Get these  medicines  Calcium with Vitamin D- Your body requires 1200-1500 mg of Calcium a day and [phone] IU of Vitamin D a day.  You can only absorb 500 mg of Calcium at a time therefore Calcium must be taken in 2 or 3 separate doses throughout the day.  Hormones- Hormone therapy has been associated with increased risk for certain cancers and heart disease.  Talk to your healthcare provider about if you need relief from menopausal symptoms.  Aspirin- Ask your healthcare provider about taking Aspirin to prevent Heart Disease and Stroke.  Get these Immuniztions  Flu shot- Every fall  Pneumonia shot- Once after the age of 65; if you are younger ask your healthcare provider if you need a pneumonia shot.  Tetanus- Every ten years.  Shingles vaccine - after the age of 65 to prevent shingles.  Take these steps  Don't smoke- Your healthcare provider can help you quit. For tips on how to quit, ask your healthcare provider or go to www.smokefree.gov or call 1-800 QUIT-NOW.  Be physically active- Exercise 5 days a week for a minimum of 30 minutes.  If you are not already physically active, start slow and gradually work up to 30 minutes of moderate physical activity.  Try walking, dancing, bike riding, swimming, etc.  Eat a healthy diet- Eat a variety of healthy foods such as fruits, vegetables, whole grains, low fat milk, low fat cheeses, yogurt, lean meats, chicken, fish, eggs, dried beans, tofu, etc.  For more information go to www.thenutritionsource.org  Dental visit- Brush and floss teeth twice daily; visit your dentist twice a year.  Eye exam- Visit your Optometrist or Ophthalmologist yearly.  Drink alcohol in moderation- Limit alcohol intake to one drink or less a day.  Never drink and drive.  Depression- Your emotional health is as important as your physical health.  If you're feeling down or losing interest in things you normally enjoy, please talk to your healthcare provider.  Seat Belts- can  save your life; always wear one  Smoke/Carbon Monoxide detectors- These detectors need to be installed on the appropriate level of your home.  Replace batteries at least once a year.  Violence- If anyone is threatening or hurting you, please tell your healthcare provider.  Living Will/ Health care power of attorney- Discuss with your healthcare provider and family.  If you have lab work done today you will be contacted with your lab results within the next 2 weeks.  If you have not heard from us  then please contact us. The fastest way to get your results is to register for My Chart.   IF you received an x-ray today, you will receive an invoice from [facility]. Please contact [facility] at [phone] with questions or concerns regarding your invoice.   IF you received labwork today, you will receive an invoice from [place]. Please contact [facility] at [phone] with questions or concerns regarding your invoice.   Our billing staff will not be able to assist you with questions regarding bills from these companies.  You will be contacted with the lab results as soon as they are available. The fastest way to get your results is to activate your My Chart account. Instructions are located on the last page of this paperwork. If you have not heard from [place] regarding the results in 2 weeks, please contact this office.         Signed, Merri Ray, MD [facility]

## 2020-06-28 LAB — COMPREHENSIVE METABOLIC PANEL
ALT: 22 IU/L (ref 0–32)
AST: 16 IU/L (ref 0–40)
Albumin/Globulin Ratio: 1.1 — ABNORMAL LOW (ref 1.2–2.2)
Albumin: 4.1 g/dL (ref 3.8–4.8)
Alkaline Phosphatase: 96 IU/L (ref 44–121)
BUN/Creatinine Ratio: 22 (ref 12–28)
BUN: 17 mg/dL (ref 8–27)
Bilirubin Total: 0.3 mg/dL (ref 0.0–1.2)
CO2: 24 mmol/L (ref 20–29)
Calcium: 10.1 mg/dL (ref 8.7–10.3)
Chloride: 99 mmol/L (ref 96–106)
Creatinine, Ser: 0.78 mg/dL (ref 0.57–1.00)
GFR calc Af Amer: 93 mL/min/{1.73_m2} (ref >59)
GFR calc non Af Amer: 81 mL/min/{1.73_m2} (ref >59)
Globulin, Total: 3.7 g/dL (ref 1.5–4.5)
Glucose: 101 mg/dL — ABNORMAL HIGH (ref 65–99)
Potassium: 4.1 mmol/L (ref 3.5–5.2)
Sodium: 140 mmol/L (ref 134–144)
Total Protein: 7.8 g/dL (ref 6.0–8.5)

## 2020-06-28 LAB — LIPID PANEL
Chol/HDL Ratio: 4 ratio (ref 0.0–4.4)
Cholesterol, Total: 206 mg/dL — ABNORMAL HIGH (ref 100–199)
HDL: 52 mg/dL (ref >39)
LDL Chol Calc (NIH): 133 mg/dL — ABNORMAL HIGH (ref 0–99)
Triglycerides: 118 mg/dL (ref 0–149)
VLDL Cholesterol Cal: 21 mg/dL (ref 5–40)

## 2020-07-03 ENCOUNTER — Ambulatory Visit (INDEPENDENT_AMBULATORY_CARE_PROVIDER_SITE_OTHER): Payer: PRIVATE HEALTH INSURANCE | Admitting: Family Medicine

## 2020-07-03 ENCOUNTER — Other Ambulatory Visit: Payer: Self-pay

## 2020-07-03 ENCOUNTER — Encounter: Payer: Self-pay | Admitting: Family Medicine

## 2020-07-03 VITALS — BP 145/84 | HR 76 | Temp 97.4°F | Ht 61.0 in | Wt 188.0 lb

## 2020-07-03 DIAGNOSIS — H6593 Unspecified nonsuppurative otitis media, bilateral: Secondary | ICD-10-CM | POA: Diagnosis not present

## 2020-07-03 DIAGNOSIS — R059 Cough, unspecified: Secondary | ICD-10-CM | POA: Diagnosis not present

## 2020-07-03 DIAGNOSIS — I10 Essential (primary) hypertension: Secondary | ICD-10-CM

## 2020-07-03 MED ORDER — PREDNISONE 20 MG PO TABS: ORAL_TABLET | ORAL | 0 refills | Status: AC

## 2020-07-03 MED ORDER — ALBUTEROL SULFATE HFA 108 (90 BASE) MCG/ACT IN AERS: 2.0000 | INHALATION_SPRAY | Freq: Four times a day (QID) | RESPIRATORY_TRACT | 3 refills | Status: AC | PRN

## 2020-07-03 NOTE — Progress Notes (Signed)
2/17/202211:20 AM  Erika Wolf 11-07-1955, 65 y.o., female [id number]  Chief Complaint  Patient presents with  . Cough    Ongoing since christmas and has worsened again after z pack completion , has wheezing and some sob , hx of exercise induced asthma     HPI:   Patient is a 65 y.o. female with past medical history significant for HTN, Allergies, asthma, GERD who presents today for Cough.  Has had a cough since Christmas Saw Dr. Neva Seat last week Cough was improved at that time Still having congestion and fluid behind ears Has not been taking anything for her cough  CXR done 2/4 IMPRESSION: Hazy lingular airspace disease concerning for atelectasis. Otherwise no acute cardiopulmonary disease. Cough syrup provided  History of Asthma and allergies Takes Zyrtec daily Has the albuterol inhaler Was given antibiotics Azithromycin on 2/2    Depression screen [facility] 2/9 06/27/2020 06/18/2020 12/28/2019  Decreased Interest 0 0 0  Down, Depressed, Hopeless 0 0 0  PHQ - 2 Score 0 0 0    Fall Risk  06/27/2020 06/20/2020 06/18/2020 12/28/2019 05/15/2019  Falls in the past year? 0 0 0 0 0  Comment - - - - -  Number falls in past yr: - - - 0 0  Injury with Fall? - - - 0 -  Risk for fall due to : - - - No Fall Risks -  Follow up Falls evaluation completed Falls evaluation completed Falls evaluation completed Falls evaluation completed -     No Known Allergies  Prior to Admission medications   Medication Sig Start Date End Date Taking? Authorizing Provider  albuterol (VENTOLIN HFA) 108 (90 Base) MCG/ACT inhaler Inhale into the lungs every 6 (six) hours as needed for wheezing or shortness of breath.   Yes [provider]  amLODipine (NORVASC) 5 MG tablet Take 1 tablet (5 mg total) by mouth daily. 06/27/20  Yes Shade Flood, MD  benzonatate (TESSALON) 200 MG capsule Take 1 capsule (200 mg total) by mouth 2 (two) times daily as needed for cough. 06/18/20  Yes Shade Flood,  MD  calcium-vitamin D (OSCAL WITH D) 500-200 MG-UNIT TABS tablet Take by mouth.   Yes [provider]  cetirizine (ZYRTEC) 10 MG tablet Take 10 mg by mouth daily.   Yes [provider]  fluticasone (FLONASE) 50 MCG/ACT nasal spray Place 1 Dose into the nose daily.   Yes [provider]  hydrochlorothiazide (HYDRODIURIL) 25 MG tablet Take 1 tablet (25 mg total) by mouth daily. 06/27/20  Yes Shade Flood, MD  HYDROcodone-homatropine [facility]) 5-1.5 MG/5ML syrup Take 5 mLs by mouth every 8 (eight) hours as needed for cough (start at bedtime as needed). 06/20/20  Yes Shade Flood, MD  losartan (COZAAR) 100 MG tablet Take 1 tablet (100 mg total) by mouth daily. 06/27/20  Yes Shade Flood, MD  metoprolol tartrate (LOPRESSOR) 100 MG tablet Take 1 tablet (100 mg total) by mouth daily. 06/27/20  Yes Shade Flood, MD  naproxen sodium (ALEVE) 220 MG tablet Take 220 mg by mouth.   Yes [provider]  Nebulizer MISC by Does not apply route.   Yes [provider]  omeprazole (PRILOSEC) 20 MG capsule Take 1 capsule (20 mg total) by mouth daily. 06/27/20  Yes Shade Flood, MD  venlafaxine XR (EFFEXOR XR) 75 MG 24 hr capsule Take 1 capsule (75 mg total) by mouth daily with breakfast. 04/21/20  Yes Ardell Isaacs, Brook  E, MD    Past Medical History:  Diagnosis Date  . HTN (hypertension)   . Hypertension    Phreesia 06/20/2020  . Sleep apnea    Phreesia 06/20/2020  . Urinary incontinence    cough, sneeze    Past Surgical History:  Procedure Laterality Date  . CESAREAN SECTION  5 total  . CESAREAN SECTION N/A    Phreesia 06/20/2020  . CHOLECYSTECTOMY    . FRACTURE SURGERY N/A    Phreesia 06/20/2020  . GALLBLADDER SURGERY    . JOINT REPLACEMENT N/A    Phreesia 06/20/2020  . ROTATOR CUFF REPAIR    . TOTAL ABDOMINAL HYSTERECTOMY  2005   Fibroid ovaries and tubes removed also  . TOTAL HIP ARTHROPLASTY Right 10/18/2018   Procedure: TOTAL  HIP ARTHROPLASTY ANTERIOR APPROACH;  Surgeon: Ollen Gross, MD;  Location: [facility];  Service: Orthopedics;  Laterality: Right;     Social History   Tobacco Use  . Smoking status: Never Smoker  . Smokeless tobacco: Never Used  Substance Use Topics  . Alcohol use: Yes    Alcohol/week: 1.0 standard drink    Types: 1 Glasses of wine per week    Family History  Problem Relation Age of Onset  . Hyperlipidemia Mother   . Hypertension Mother   . Dementia Mother   . Cancer Father        colon  . Aneurysm Father        brain  . Cancer Sister        ovarian  . Hyperlipidemia Brother   . Breast cancer Daughter     Review of Systems  Constitutional: Negative for chills, fever and malaise/fatigue.  HENT: Positive for congestion. Negative for ear discharge, ear pain, sinus pain and sore throat.   Respiratory: Positive for cough, sputum production and wheezing. Negative for shortness of breath.   Cardiovascular: Negative for chest pain, palpitations and leg swelling.  Gastrointestinal: Negative for abdominal pain, constipation, diarrhea, heartburn, nausea and vomiting.  Genitourinary: Negative for dysuria, frequency and urgency.  Musculoskeletal: Negative for back pain, joint pain and myalgias.  Skin: Negative for rash.  Neurological: Negative for dizziness, weakness and headaches.     OBJECTIVE:  Today's Vitals   07/03/20 1102  BP: (!) 145/84  Pulse: 76  Temp: (!) 97.4 F (36.3 C)  SpO2: 94%  Weight: 188 lb (85.3 kg)  Height: 5\' 1"  (1.549 m)   Body mass index is 35.52 kg/m. BP Readings from Last 3 Encounters:  07/03/20 (!) 145/84  06/27/20 130/86  04/21/20 138/74     Physical Exam Constitutional:      General: She is not in acute distress.    Appearance: Normal appearance. She is not ill-appearing.  HENT:     Head: Normocephalic.     Right Ear: Tympanic membrane, ear canal and external ear normal. There is no impacted cerumen.     Left Ear: Tympanic  membrane, ear canal and external ear normal. There is no impacted cerumen.  Cardiovascular:     Rate and Rhythm: Normal rate and regular rhythm.     Pulses: Normal pulses.     Heart sounds: Normal heart sounds. No murmur heard. No friction rub. No gallop.   Pulmonary:     Effort: Pulmonary effort is normal. No respiratory distress.     Breath sounds: No stridor. Wheezing present. No rhonchi or rales.  Abdominal:     General: Bowel sounds are normal.     Palpations: Abdomen is  soft.     Tenderness: There is no abdominal tenderness.  Musculoskeletal:     Right lower leg: No edema.     Left lower leg: No edema.  Skin:    General: Skin is warm and dry.  Neurological:     Mental Status: She is alert and oriented to person, place, and time.  Psychiatric:        Mood and Affect: Mood normal.        Behavior: Behavior normal.     No results found for this or any previous visit (from the past 24 hour(s)).  No results found.   ASSESSMENT and PLAN  Problem List Items Addressed This Visit      Cardiovascular and Mediastinum   Essential hypertension    Other Visit Diagnoses    Cough    -  Primary   Relevant Medications   predniSONE (DELTASONE) 20 MG tablet   albuterol (VENTOLIN HFA) 108 (90 Base) MCG/ACT inhaler   Other Relevant Orders   CBC with Differential   Fluid level behind tympanic membrane of both ears          Plan . Will follow up with lab results . Prednisone and albuterol refills sent, r/se/b of medication discussed . Continue Mucinex DM BID . Discussed the use of OTC decongestants and nasal sprays for fluid behind ears . RTC/ED precautions provided   Return in about 3 months (around 09/30/2020).    Macario Carls Naiyah Klostermann, FNP-BC Primary Care at [facility] [address] [place], [place] [address] Ph.  [phone] Fax [phone]

## 2020-07-03 NOTE — Patient Instructions (Signed)
Over the counter Mucinex DM twice a day (Guaifenesin)   Cough, Adult A cough helps to clear your throat and lungs. A cough may be a sign of an illness or another medical condition. An acute cough may only last 2-3 weeks, while a chronic cough may last 8 or more weeks. Many things can cause a cough. They include:  Germs (viruses or bacteria) that attack the airway.  Breathing in things that bother (irritate) your lungs.  Allergies.  Asthma.  Mucus that runs down the back of your throat (postnasal drip).  Smoking.  Acid backing up from the stomach into the tube that moves food from the mouth to the stomach (gastroesophageal reflux).  Some medicines.  Lung problems.  Other medical conditions, such as heart failure or a blood clot in the lung (pulmonary embolism). Follow these instructions at home: Medicines  Take over-the-counter and prescription medicines only as told by your doctor.  Talk with your doctor before you take medicines that stop a cough (cough suppressants). Lifestyle  Do not smoke, and try not to be around smoke. Do not use any products that contain nicotine or tobacco, such as cigarettes, e-cigarettes, and chewing tobacco. If you need help quitting, ask your doctor.  Drink enough fluid to keep your pee (urine) pale yellow.  Avoid caffeine.  Do not drink alcohol if your doctor tells you not to drink.   General instructions  Watch for any changes in your cough. Tell your doctor about them.  Always cover your mouth when you cough.  Stay away from things that make you cough, such as perfume, candles, campfire smoke, or cleaning products.  If the air is dry, use a cool mist vaporizer or humidifier in your home.  If your cough is worse at night, try using extra pillows to raise your head up higher while you sleep.  Rest as needed.  Keep all follow-up visits as told by your doctor. This is important.   Contact a doctor if:  You have new  symptoms.  You cough up pus.  Your cough does not get better after 2-3 weeks, or your cough gets worse.  Cough medicine does not help your cough and you are not sleeping well.  You have pain that gets worse or pain that is not helped with medicine.  You have a fever.  You are losing weight and you do not know why.  You have night sweats. Get help right away if:  You cough up blood.  You have trouble breathing.  Your heartbeat is very fast. These symptoms may be an emergency. Do not wait to see if the symptoms will go away. Get medical help right away. Call your local emergency services (911 in the [place] Do not drive yourself to the hospital. Summary  A cough helps to clear your throat and lungs. Many things can cause a cough.  Take over-the-counter and prescription medicines only as told by your doctor.  Always cover your mouth when you cough.  Contact a doctor if you have new symptoms or you have a cough that does not get better or gets worse. This information is not intended to replace advice given to you by your health care provider. Make sure you discuss any questions you have with your health care provider. Document Revised: 06/22/2019 Document Reviewed: 05/22/2018 Elsevier Patient Education  2021 [employer].

## 2020-07-04 LAB — CBC WITH DIFFERENTIAL/PLATELET
Basophils Absolute: 0.1 10*3/uL (ref 0.0–0.2)
Basos: 1 %
EOS (ABSOLUTE): 0.7 10*3/uL — ABNORMAL HIGH (ref 0.0–0.4)
Eos: 7 %
Hematocrit: 43.6 % (ref 34.0–46.6)
Hemoglobin: 14.6 g/dL (ref 11.1–15.9)
Immature Grans (Abs): 0 10*3/uL (ref 0.0–0.1)
Immature Granulocytes: 0 %
Lymphocytes Absolute: 1.6 10*3/uL (ref 0.7–3.1)
Lymphs: 17 %
MCH: 29.9 pg (ref 26.6–33.0)
MCHC: 33.5 g/dL (ref 31.5–35.7)
MCV: 89 fL (ref 79–97)
Monocytes Absolute: 1.3 10*3/uL — ABNORMAL HIGH (ref 0.1–0.9)
Monocytes: 13 %
Neutrophils Absolute: 6.1 10*3/uL (ref 1.4–7.0)
Neutrophils: 62 %
Platelets: 401 10*3/uL (ref 150–450)
RBC: 4.88 x10E6/uL (ref 3.77–5.28)
RDW: 13.2 % (ref 11.7–15.4)
WBC: 9.8 10*3/uL (ref 3.4–10.8)

## 2020-07-17 ENCOUNTER — Ambulatory Visit (INDEPENDENT_AMBULATORY_CARE_PROVIDER_SITE_OTHER): Payer: No Typology Code available for payment source | Admitting: Otolaryngology

## 2020-07-17 ENCOUNTER — Encounter (INDEPENDENT_AMBULATORY_CARE_PROVIDER_SITE_OTHER): Payer: Self-pay | Admitting: Otolaryngology

## 2020-07-17 ENCOUNTER — Other Ambulatory Visit: Payer: Self-pay

## 2020-07-17 VITALS — Temp 97.3°F

## 2020-07-17 DIAGNOSIS — J310 Chronic rhinitis: Secondary | ICD-10-CM | POA: Diagnosis not present

## 2020-07-17 DIAGNOSIS — H6983 Other specified disorders of Eustachian tube, bilateral: Secondary | ICD-10-CM

## 2020-07-17 NOTE — Progress Notes (Signed)
HPI: Erika Wolf is a 65 y.o. female who presents is referred by her PCP Dr. Neva Seat for evaluation of possible serous otitis media.  She was treated for a cough a couple months ago with Z-Pak and steroids.  The cough is doing better but now she has some crackling in her ears when she blows her nose.  There is a question as to whether she has fluid behind her ears.  She has noted a longstanding hearing loss in the right ear more than the left ear for the past 10 to 15 years.  She has had no pain in the ears and no drainage from the ears. .  Past Medical History:  Diagnosis Date  . HTN (hypertension)   . Hypertension    Phreesia 06/20/2020  . Sleep apnea    Phreesia 06/20/2020  . Urinary incontinence    cough, sneeze   Past Surgical History:  Procedure Laterality Date  . CESAREAN SECTION  5 total  . CESAREAN SECTION N/A    Phreesia 06/20/2020  . CHOLECYSTECTOMY    . FRACTURE SURGERY N/A    Phreesia 06/20/2020  . GALLBLADDER SURGERY    . JOINT REPLACEMENT N/A    Phreesia 06/20/2020  . ROTATOR CUFF REPAIR    . TOTAL ABDOMINAL HYSTERECTOMY  2005   Fibroid ovaries and tubes removed also  . TOTAL HIP ARTHROPLASTY Right 10/18/2018   Procedure: TOTAL HIP ARTHROPLASTY ANTERIOR APPROACH;  Surgeon: Ollen Gross, MD;  Location: [facility];  Service: Orthopedics;  Laterality: Right;    Social History   Socioeconomic History  . Marital status: Married    Spouse name: Not on file  . Number of children: Not on file  . Years of education: Not on file  . Highest education level: Not on file  Occupational History  . Occupation: homemaker  Tobacco Use  . Smoking status: Never Smoker  . Smokeless tobacco: Never Used  Vaping Use  . Vaping Use: Never used  Substance and Sexual Activity  . Alcohol use: Yes    Alcohol/week: 1.0 standard drink    Types: 1 Glasses of wine per week  . Drug use: No  . Sexual activity: Yes    Birth control/protection: Surgical    Comment: hysterectomy   Other Topics Concern  . Not on file  Social History Narrative   Married;   5 children, 4 live in [place], 1 lives in [place].   2 grandchildren         Social Determinants of Corporate investment banker Strain: Not on file  Food Insecurity: Not on file  Transportation Needs: Not on file  Physical Activity: Not on file  Stress: Not on file  Social Connections: Not on file   Family History  Problem Relation Age of Onset  . Hyperlipidemia Mother   . Hypertension Mother   . Dementia Mother   . Cancer Father        colon  . Aneurysm Father        brain  . Cancer Sister        ovarian  . Hyperlipidemia Brother   . Breast cancer Daughter    No Known Allergies Prior to Admission medications   Medication Sig Start Date End Date Taking? Authorizing Provider  albuterol (VENTOLIN HFA) 108 (90 Base) MCG/ACT inhaler Inhale 2 puffs into the lungs every 6 (six) hours as needed for wheezing or shortness of breath. 07/03/20   Just, Azalee Course, FNP  amLODipine (NORVASC) 5 MG tablet  Take 1 tablet (5 mg total) by mouth daily. 06/27/20   Shade Flood, MD  benzonatate (TESSALON) 200 MG capsule Take 1 capsule (200 mg total) by mouth 2 (two) times daily as needed for cough. 06/18/20   Shade Flood, MD  calcium-vitamin D (OSCAL WITH D) 500-200 MG-UNIT TABS tablet Take by mouth.    [provider]  cetirizine (ZYRTEC) 10 MG tablet Take 10 mg by mouth daily.    [provider]  fluticasone (FLONASE) 50 MCG/ACT nasal spray Place 1 Dose into the nose daily.    [provider]  hydrochlorothiazide (HYDRODIURIL) 25 MG tablet Take 1 tablet (25 mg total) by mouth daily. 06/27/20   Shade Flood, MD  HYDROcodone-homatropine [facility]) 5-1.5 MG/5ML syrup Take 5 mLs by mouth every 8 (eight) hours as needed for cough (start at bedtime as needed). 06/20/20   Shade Flood, MD  losartan (COZAAR) 100 MG tablet Take 1 tablet (100 mg total) by mouth daily. 06/27/20   Shade Flood, MD  metoprolol tartrate (LOPRESSOR) 100 MG tablet Take 1 tablet (100 mg total) by mouth daily. 06/27/20   Shade Flood, MD  naproxen sodium (ALEVE) 220 MG tablet Take 220 mg by mouth.    [provider]  Nebulizer MISC by Does not apply route.    [provider]  omeprazole (PRILOSEC) 20 MG capsule Take 1 capsule (20 mg total) by mouth daily. 06/27/20   Shade Flood, MD  venlafaxine XR (EFFEXOR XR) 75 MG 24 hr capsule Take 1 capsule (75 mg total) by mouth daily with breakfast. 04/21/20   Patton Salles, MD     Positive ROS: Otherwise negative  All other systems have been reviewed and were otherwise negative with the exception of those mentioned in the HPI and as above.  Physical Exam: Constitutional: Alert, well-appearing, no acute distress Ears: External ears without lesions or tenderness.  On microscopic exam both ear canals are clear.  She has tympanosclerosis of both TMs making assessment of the middle ear little bit difficult.  But the TMs had good mobility on pneumatic otoscopy bilaterally.  On tuning fork testing AC was greater than BC bilaterally with the 512 4.  Subjectively she heard better in the left ear compared to the right with the 1024 tuning fork.  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  No significant conductive component and no clinical evidence of serous otitis media. Nasal: External nose without lesions. Septum relatively midline. Clear nasal passages Oral: Lips and gums without lesions. Tongue and palate mucosa without lesions. Posterior oropharynx clear. Neck: No palpable adenopathy or masses Respiratory: Breathing comfortably  Skin: No facial/neck lesions or rash noted.  Procedures  Assessment: The crackling and popping in her ears are probably more related to eustachian tube dysfunction and rhinitis than serous otitis media. 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  Plan: Rebeccah was not scheduled for audiogram today and really was not interested in getting audiogram at this time as she is noted that she has had hearing loss in the right ear for the last 10 to 15 years. For the crackling and popping in her ears recommended use of nasal steroid spray which she already has Nasacort 2 sprays each nostril at night as well as use  of saline irrigation as needed.  She can also try antihistamines as needed.   Narda Bonds, MD   CC:

## 2020-12-26 ENCOUNTER — Ambulatory Visit: Payer: Self-pay | Admitting: Family Medicine

## 2020-12-31 ENCOUNTER — Ambulatory Visit: Payer: No Typology Code available for payment source | Admitting: Family Medicine

## 2021-03-18 ENCOUNTER — Other Ambulatory Visit: Payer: Self-pay

## 2021-03-18 ENCOUNTER — Ambulatory Visit (INDEPENDENT_AMBULATORY_CARE_PROVIDER_SITE_OTHER): Payer: Medicare HMO | Admitting: Family Medicine

## 2021-03-18 ENCOUNTER — Encounter: Payer: Self-pay | Admitting: Family Medicine

## 2021-03-18 VITALS — BP 122/78 | HR 71 | Temp 98.2°F | Resp 16 | Ht 61.0 in | Wt 192.4 lb

## 2021-03-18 DIAGNOSIS — K219 Gastro-esophageal reflux disease without esophagitis: Secondary | ICD-10-CM

## 2021-03-18 DIAGNOSIS — M545 Low back pain, unspecified: Secondary | ICD-10-CM | POA: Diagnosis not present

## 2021-03-18 DIAGNOSIS — I10 Essential (primary) hypertension: Secondary | ICD-10-CM

## 2021-03-18 MED ORDER — CYCLOBENZAPRINE HCL 5 MG PO TABS: 5.0000 mg | ORAL_TABLET | Freq: Three times a day (TID) | ORAL | 1 refills | Status: DC | PRN

## 2021-03-18 MED ORDER — AMLODIPINE BESYLATE 5 MG PO TABS: 5.0000 mg | ORAL_TABLET | Freq: Every day | ORAL | 1 refills | Status: DC

## 2021-03-18 MED ORDER — METOPROLOL TARTRATE 100 MG PO TABS: 100.0000 mg | ORAL_TABLET | Freq: Every day | ORAL | 1 refills | Status: DC

## 2021-03-18 MED ORDER — OMEPRAZOLE 20 MG PO CPDR: 20.0000 mg | DELAYED_RELEASE_CAPSULE | Freq: Every day | ORAL | 1 refills | Status: AC

## 2021-03-18 MED ORDER — MELOXICAM 7.5 MG PO TABS: 7.5000 mg | ORAL_TABLET | Freq: Every day | ORAL | 0 refills | Status: DC

## 2021-03-18 MED ORDER — LOSARTAN POTASSIUM-HCTZ 100-25 MG PO TABS: 1.0000 | ORAL_TABLET | Freq: Every day | ORAL | 1 refills | Status: DC

## 2021-03-18 NOTE — Progress Notes (Signed)
Subjective:  Patient ID: Erika Wolf, female    DOB: [birth year]  Age: 65 y.o. MRN: [id number]  CC:  Chief Complaint  Patient presents with   Gastroesophageal Reflux    Pt reports doing well, not having any issues at this time    Hypertension    Pt denies physical sxs, notes she has not taken home BP in some time due to being very busy no concerns    Back Pain    Pt has been having back pain for apx 1 year now, notes no injury, lower back, burning pain, mostly constant, ibuprofen will take edge off of pain but not eliminate this.     HPI [facility] presents for   Hypertension: Amlodipine 5 mg daily, losartan 100 mg daily, metoprolol 100 mg daily, HCTZ 25 mg daily. Home readings:none.  No new side effects with meds.  Finished kitchen remodel from the summer.  BP Readings from Last 3 Encounters:  03/18/21 122/78  07/03/20 (!) 145/84  06/27/20 130/86   Lab Results  Component Value Date   CREATININE 0.78 06/27/2020   GERD: Omeprazole 20 mg daily.  Taking daily.  No hx of PUD.   Low back pain Past 1 year or so.  No known injury, no prior surgery. No prior treatment.  Left low back, worse past few months. No change in activity - limited by pain.  No leg radiation.  Burning pain. No rash.  No bowel or bladder incontinence, no saddle anesthesia, no lower extremity weakness.  Tx: ibuprofen helpful 400mg  in am. Pain returns. Topical analgesic - min benefit.  No impact on sleep.    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   Past Medical History:  Diagnosis Date   HTN (hypertension)     Hypertension    [employer]rees[employer] 06/20/2020   Sleep apnea    [employer] 06/20/2020   Urinary incontinence    cough, sneeze   Past Surgical History:  Procedure Laterality Date   CESAREAN SECTION  5 total   CESAREAN SECTION N/A    [employer] 06/20/2020   CHOLECYSTECTOMY     FRACTURE SURGERY N/A    [employer] 06/20/2020   GALLBLADDER SURGERY     JOINT REPLACEMENT N/A    [employer] 06/20/2020   ROTATOR CUFF REPAIR     TOTAL ABDOMINAL HYSTERECTOMY  2005   Fibroid ovaries and tubes removed also   TOTAL HIP ARTHROPLASTY Right 10/18/2018   Procedure: TOTAL HIP ARTHROPLASTY ANTERIOR APPROACH;  Surgeon: Gaynelle Arabian, MD;  Location: [facility];  Service: Orthopedics;  Laterality: Right;  11min   No Known Allergies Prior to Admission medications   Medication Sig Start Date End Date Taking? Authorizing Provider  albuterol (VENTOLIN HFA) 108 (90 Base) MCG/ACT inhaler Inhale 2 puffs into the lungs every 6 (six) hours as needed for wheezing or shortness of breath. 07/03/20  Yes Just, Laurita Quint, FNP  amLODipine (NORVASC) 5 MG tablet Take 1 tablet (5 mg total) by mouth daily. 06/27/20  Yes Wendie Agreste, MD  calcium-vitamin D (OSCAL WITH D) 500-200 MG-UNIT TABS tablet Take by mouth.   Yes [provider]  cetirizine (ZYRTEC) 10 MG tablet  Take 10 mg by mouth daily.   Yes [provider]  fluticasone (FLONASE) 50 MCG/ACT nasal spray Place 1 Dose into the nose daily.   Yes [provider]  hydrochlorothiazide (HYDRODIURIL) 25 MG tablet Take 1 tablet (25 mg total) by mouth daily. 06/27/20  Yes Shade FloodGreene, Naly Schwanz R, MD  losartan (COZAAR) 100 MG tablet Take 1 tablet (100 mg total) by mouth daily. 06/27/20  Yes Shade FloodGreene, Fredonia Casalino R, MD  metoprolol tartrate (LOPRESSOR) 100 MG tablet Take 1 tablet (100 mg total) by mouth daily. 06/27/20  Yes Shade FloodGreene, Emrys Mckamie R, MD  omeprazole (PRILOSEC) 20 MG capsule Take 1 capsule (20 mg total) by mouth daily. 06/27/20  Yes Shade FloodGreene, Najai Waszak R, MD  venlafaxine XR (EFFEXOR XR)  75 MG 24 hr capsule Take 1 capsule (75 mg total) by mouth daily with breakfast. 04/21/20  Yes Patton SallesAmundson C Silva, Brook E, MD  benzonatate (TESSALON) 200 MG capsule Take 1 capsule (200 mg total) by mouth 2 (two) times daily as needed for cough. 06/18/20   Shade FloodGreene, Shavon Zenz R, MD  HYDROcodone-homatropine [facility](HYCODAN) 5-1.5 MG/5ML syrup Take 5 mLs by mouth every 8 (eight) hours as needed for cough (start at bedtime as needed). 06/20/20   Shade FloodGreene, Antionio Negron R, MD  naproxen sodium (ALEVE) 220 MG tablet Take 220 mg by mouth.    [provider]  Nebulizer MISC by Does not apply route.    [provider]   Social History   Socioeconomic History   Marital status: Married    Spouse name: Not on file   Number of children: Not on file   Years of education: Not on file   Highest education level: Not on file  Occupational History   Occupation: homemaker  Tobacco Use   Smoking status: Never   Smokeless tobacco: Never  Vaping Use   Vaping Use: Never used  Substance and Sexual Activity   Alcohol use: Yes    Alcohol/week: 1.0 standard drink    Types: 1 Glasses of wine per week   Drug use: No   Sexual activity: Yes    Birth control/protection: Surgical    Comment: hysterectomy  Other Topics Concern   Not on file  Social History Narrative   Married;   5 children, 4 live in [place], 1 lives in [place]   2 grandchildren         Social Determinants of Corporate investment bankerHealth   Financial Resource Strain: Not on file  Food Insecurity: Not on file  Transportation Needs: Not on file  Physical Activity: Not on file  Stress: Not on file  Social Connections: Not on file  Intimate Partner Violence: Not on file    Review of Systems  Constitutional:  Negative for fatigue and unexpected weight change.  Respiratory:  Negative for chest tightness and shortness of breath.   Cardiovascular:  Negative for chest pain, palpitations and leg swelling.  Gastrointestinal:  Negative for abdominal pain and blood in stool.   Neurological:  Negative for dizziness, syncope, light-headedness and headaches.    Objective:   Vitals:   03/18/21 1346  BP: 122/78  Pulse: 71  Resp: 16  Temp: 98.2 F (36.8 C)  TempSrc: Temporal  SpO2: 96%  Weight: 192 lb 6.4 oz (87.3 kg)  Height: 5\' 1"  (1.549 m)     Physical Exam Vitals reviewed.  Constitutional:      Appearance: Normal appearance. She is well-developed.  HENT:     Head: Normocephalic and atraumatic.  Eyes:     Conjunctiva/sclera: Conjunctivae  normal.     Pupils: Pupils are equal, round, and reactive to light.  Neck:     Vascular: No carotid bruit.  Cardiovascular:     Rate and Rhythm: Normal rate and regular rhythm.     Heart sounds: Normal heart sounds.  Pulmonary:     Effort: Pulmonary effort is normal.     Breath sounds: Normal breath sounds.  Abdominal:     Palpations: Abdomen is soft. There is no pulsatile mass.     Tenderness: There is no abdominal tenderness.  Musculoskeletal:     Right lower leg: No edema.     Left lower leg: No edema.     Comments: Lumbar spine: No midline bony ttp.  Left lower paraspinals with some spasm, ttp. Flex to 90, slight decr left lat flex, o/w intact rom.  Able to heel/toe walk Reflexes 2+ patella, achilles.  Neg seated SLR bilat  Skin:    General: Skin is warm and dry.  Neurological:     Mental Status: She is alert and oriented to person, place, and time.  Psychiatric:        Mood and Affect: Mood normal.        Behavior: Behavior normal.       Assessment & Plan:  Erika Wolf is a 65 y.o. female . Left-sided low back pain without sciatica, unspecified chronicity - Plan: meloxicam (MOBIC) 7.5 MG tablet, cyclobenzaprine (FLEXERIL) 5 MG tablet, DG Lumbar Spine Complete  -Likely mechanical low back pain, suspect component of degenerative disc disease but will check imaging, handout given on home treatment options, short-term meloxicam, Flexeril if needed with potential side effects discussed.   Recheck 1 month  Essential hypertension - Plan: metoprolol tartrate (LOPRESSOR) 100 MG tablet, amLODipine (NORVASC) 5 MG tablet  -  Stable, tolerating current regimen. Medications refilled. Labs pending as above.   Gastroesophageal reflux disease, unspecified whether esophagitis present - Plan: omeprazole (PRILOSEC) 20 MG capsule  -  Stable, tolerating current regimen. Medications refilled. Labs pending as above.    Meds ordered this encounter  Medications   metoprolol tartrate (LOPRESSOR) 100 MG tablet    Sig: Take 1 tablet (100 mg total) by mouth daily.    Dispense:  90 tablet    Refill:  1   omeprazole (PRILOSEC) 20 MG capsule    Sig: Take 1 capsule (20 mg total) by mouth daily.    Dispense:  90 capsule    Refill:  1   amLODipine (NORVASC) 5 MG tablet    Sig: Take 1 tablet (5 mg total) by mouth daily.    Dispense:  90 tablet    Refill:  1   meloxicam (MOBIC) 7.5 MG tablet    Sig: Take 1 tablet (7.5 mg total) by mouth daily.    Dispense:  30 tablet    Refill:  0   losartan-hydrochlorothiazide (HYZAAR) 100-25 MG tablet    Sig: Take 1 tablet by mouth daily.    Dispense:  90 tablet    Refill:  1   cyclobenzaprine (FLEXERIL) 5 MG tablet    Sig: Take 1 tablet (5 mg total) by mouth 3 (three) times daily as needed for muscle spasms (start qhs prn due to sedation).    Dispense:  15 tablet    Refill:  1   Patient Instructions   Try meloxicam once per day for low back pain, do not take ibuprofen, Aleve or other NSAIDs while taking that medication.  Cyclobenzaprine muscle relaxant only if needed as  that can cause sedation.  Do not operate machinery or drive on that medication.  Have x-ray performed at the [facility] office.  Follow-up in 1 month for back pain, sooner if worsening or new symptoms.  I will check labs, no new meds today.  I did combine the losartan/HCTZ.  Thanks for coming in and take care.   Chronic Back Pain When back pain lasts longer than 3 months, it is called  chronic back pain. The cause of your back pain may not be known. Some common causes include: Wear and tear (degenerative disease) of the bones, ligaments, or disks in your back. Inflammation and stiffness in your back (arthritis). People who have chronic back pain often go through certain periods in which the pain is more intense (flare-ups). Many people can learn to manage the pain with home care. Follow these instructions at home: Pay attention to any changes in your symptoms. Take these actions to help with your pain: Managing pain and stiffness   If directed, apply ice to the painful area. Your health care provider may recommend applying ice during the first 24-48 hours after a flare-up begins. To do this: Put ice in a plastic bag. Place a towel between your skin and the bag. Leave the ice on for 20 minutes, 2-3 times per day. If directed, apply heat to the affected area as often as told by your health care provider. Use the heat source that your health care provider recommends, such as a moist heat pack or a heating pad. Place a towel between your skin and the heat source. Leave the heat on for 20-30 minutes. Remove the heat if your skin turns bright red. This is especially important if you are unable to feel pain, heat, or cold. You may have a greater risk of getting burned. Try soaking in a warm tub. Activity  Avoid bending and other activities that make the problem worse. Maintain a proper position when standing or sitting: When standing, keep your upper back and neck straight, with your shoulders pulled back. Avoid slouching. When sitting, keep your back straight and relax your shoulders. Do not round your shoulders or pull them backward. Do not sit or stand in one place for long periods of time. Take brief periods of rest throughout the day. This will reduce your pain. Resting in a lying or standing position is usually better than sitting to rest. When you are resting for longer  periods, mix in some mild activity or stretching between periods of rest. This will help to prevent stiffness and pain. Get regular exercise. Ask your health care provider what activities are safe for you. Do not lift anything that is heavier than 10 lb (4.5 kg), or the limit that you are told, until your health care provider says that it is safe. Always use proper lifting technique, which includes: Bending your knees. Keeping the load close to your body. Avoiding twisting. Sleep on a firm mattress in a comfortable position. Try lying on your side with your knees slightly bent. If you lie on your back, put a pillow under your knees. Medicines Treatment may include medicines for pain and inflammation taken by mouth or applied to the skin, prescription pain medicine, or muscle relaxants. Take over-the-counter and prescription medicines only as told by your health care provider. Ask your health care provider if the medicine prescribed to you: Requires you to avoid driving or using machinery. Can cause constipation. You may need to take these actions to prevent  or treat constipation: Drink enough fluid to keep your urine pale yellow. Take over-the-counter or prescription medicines. Eat foods that are high in fiber, such as beans, whole grains, and fresh fruits and vegetables. Limit foods that are high in fat and processed sugars, such as fried or sweet foods. General instructions Do not use any products that contain nicotine or tobacco, such as cigarettes, e-cigarettes, and chewing tobacco. If you need help quitting, ask your health care provider. Keep all follow-up visits as told by your health care provider. This is important. Contact a health care provider if: You have pain that is not relieved with rest or medicine. Your pain gets worse, or you have new pain. You have a high fever. You have rapid weight loss. You have trouble doing your normal activities. Get help right away if: You have  weakness or numbness in one or both of your legs or feet. You have trouble controlling your bladder or your bowels. You have severe back pain and have any of the following: Nausea or vomiting. Pain in your abdomen. Shortness of breath or you faint. Summary Chronic back pain is back pain that lasts longer than 3 months. When a flare-up begins, apply ice to the painful area for the first 24-48 hours. Apply a moist heat pad or use a heating pad on the painful area as directed by your health care provider. When you are resting for longer periods, mix in some mild activity or stretching between periods of rest. This will help to prevent stiffness and pain. This information is not intended to replace advice given to you by your health care provider. Make sure you discuss any questions you have with your health care provider. Document Revised: 06/13/2019 Document Reviewed: 06/13/2019 Elsevier Patient Education  2022 [place],   Merri Ray, MD [place] 03/18/21 9:46 PM

## 2021-03-18 NOTE — Patient Instructions (Addendum)
Try meloxicam once per day for low back pain, do not take ibuprofen, Aleve or other NSAIDs while taking that medication.  Cyclobenzaprine muscle relaxant only if needed as that can cause sedation.  Do not operate machinery or drive on that medication.  Have x-ray performed at the [facility] office.  Follow-up in 1 month for back pain, sooner if worsening or new symptoms.  I will check labs, no new meds today.  I did combine the losartan/HCTZ.  Thanks for coming in and take care.   Chronic Back Pain When back pain lasts longer than 3 months, it is called chronic back pain. The cause of your back pain may not be known. Some common causes include: Wear and tear (degenerative disease) of the bones, ligaments, or disks in your back. Inflammation and stiffness in your back (arthritis). People who have chronic back pain often go through certain periods in which the pain is more intense (flare-ups). Many people can learn to manage the pain with home care. Follow these instructions at home: Pay attention to any changes in your symptoms. Take these actions to help with your pain: Managing pain and stiffness   If directed, apply ice to the painful area. Your health care provider may recommend applying ice during the first 24-48 hours after a flare-up begins. To do this: Put ice in a plastic bag. Place a towel between your skin and the bag. Leave the ice on for 20 minutes, 2-3 times per day. If directed, apply heat to the affected area as often as told by your health care provider. Use the heat source that your health care provider recommends, such as a moist heat pack or a heating pad. Place a towel between your skin and the heat source. Leave the heat on for 20-30 minutes. Remove the heat if your skin turns bright red. This is especially important if you are unable to feel pain, heat, or cold. You may have a greater risk of getting burned. Try soaking in a warm tub. Activity  Avoid bending and  other activities that make the problem worse. Maintain a proper position when standing or sitting: When standing, keep your upper back and neck straight, with your shoulders pulled back. Avoid slouching. When sitting, keep your back straight and relax your shoulders. Do not round your shoulders or pull them backward. Do not sit or stand in one place for long periods of time. Take brief periods of rest throughout the day. This will reduce your pain. Resting in a lying or standing position is usually better than sitting to rest. When you are resting for longer periods, mix in some mild activity or stretching between periods of rest. This will help to prevent stiffness and pain. Get regular exercise. Ask your health care provider what activities are safe for you. Do not lift anything that is heavier than 10 lb (4.5 kg), or the limit that you are told, until your health care provider says that it is safe. Always use proper lifting technique, which includes: Bending your knees. Keeping the load close to your body. Avoiding twisting. Sleep on a firm mattress in a comfortable position. Try lying on your side with your knees slightly bent. If you lie on your back, put a pillow under your knees. Medicines Treatment may include medicines for pain and inflammation taken by mouth or applied to the skin, prescription pain medicine, or muscle relaxants. Take over-the-counter and prescription medicines only as told by your health care provider. Ask your health  care provider if the medicine prescribed to you: Requires you to avoid driving or using machinery. Can cause constipation. You may need to take these actions to prevent or treat constipation: Drink enough fluid to keep your urine pale yellow. Take over-the-counter or prescription medicines. Eat foods that are high in fiber, such as beans, whole grains, and fresh fruits and vegetables. Limit foods that are high in fat and processed sugars, such as fried  or sweet foods. General instructions Do not use any products that contain nicotine or tobacco, such as cigarettes, e-cigarettes, and chewing tobacco. If you need help quitting, ask your health care provider. Keep all follow-up visits as told by your health care provider. This is important. Contact a health care provider if: You have pain that is not relieved with rest or medicine. Your pain gets worse, or you have new pain. You have a high fever. You have rapid weight loss. You have trouble doing your normal activities. Get help right away if: You have weakness or numbness in one or both of your legs or feet. You have trouble controlling your bladder or your bowels. You have severe back pain and have any of the following: Nausea or vomiting. Pain in your abdomen. Shortness of breath or you faint. Summary Chronic back pain is back pain that lasts longer than 3 months. When a flare-up begins, apply ice to the painful area for the first 24-48 hours. Apply a moist heat pad or use a heating pad on the painful area as directed by your health care provider. When you are resting for longer periods, mix in some mild activity or stretching between periods of rest. This will help to prevent stiffness and pain. This information is not intended to replace advice given to you by your health care provider. Make sure you discuss any questions you have with your health care provider. Document Revised: 06/13/2019 Document Reviewed: 06/13/2019 Elsevier Patient Education  2022 [employer].

## 2021-03-20 ENCOUNTER — Other Ambulatory Visit: Payer: Self-pay

## 2021-03-20 ENCOUNTER — Ambulatory Visit (INDEPENDENT_AMBULATORY_CARE_PROVIDER_SITE_OTHER)
Admission: RE | Admit: 2021-03-20 | Discharge: 2021-03-20 | Disposition: A | Discharge status: Home / Self Care | Payer: Medicare HMO | Source: Ambulatory Visit | Attending: Family Medicine | Admitting: Family Medicine

## 2021-03-20 DIAGNOSIS — M545 Low back pain, unspecified: Secondary | ICD-10-CM | POA: Diagnosis not present

## 2021-03-24 DIAGNOSIS — Z1231 Encounter for screening mammogram for malignant neoplasm of breast: Secondary | ICD-10-CM | POA: Diagnosis not present

## 2021-03-24 LAB — HM MAMMOGRAPHY

## 2021-03-27 ENCOUNTER — Encounter: Payer: Self-pay | Admitting: Family Medicine

## 2021-04-21 DIAGNOSIS — Z803 Family history of malignant neoplasm of breast: Secondary | ICD-10-CM | POA: Diagnosis not present

## 2021-04-21 DIAGNOSIS — Z8249 Family history of ischemic heart disease and other diseases of the circulatory system: Secondary | ICD-10-CM | POA: Diagnosis not present

## 2021-04-21 DIAGNOSIS — Z96649 Presence of unspecified artificial hip joint: Secondary | ICD-10-CM | POA: Diagnosis not present

## 2021-04-21 DIAGNOSIS — Z791 Long term (current) use of non-steroidal anti-inflammatories (NSAID): Secondary | ICD-10-CM | POA: Diagnosis not present

## 2021-04-21 DIAGNOSIS — K219 Gastro-esophageal reflux disease without esophagitis: Secondary | ICD-10-CM | POA: Diagnosis not present

## 2021-04-21 DIAGNOSIS — Z6836 Body mass index (BMI) 36.0-36.9, adult: Secondary | ICD-10-CM | POA: Diagnosis not present

## 2021-04-21 DIAGNOSIS — I10 Essential (primary) hypertension: Secondary | ICD-10-CM | POA: Diagnosis not present

## 2021-04-21 DIAGNOSIS — J45909 Unspecified asthma, uncomplicated: Secondary | ICD-10-CM | POA: Diagnosis not present

## 2021-04-21 DIAGNOSIS — G8929 Other chronic pain: Secondary | ICD-10-CM | POA: Diagnosis not present

## 2021-04-21 DIAGNOSIS — R69 Illness, unspecified: Secondary | ICD-10-CM | POA: Diagnosis not present

## 2021-04-27 ENCOUNTER — Encounter: Payer: Self-pay | Admitting: Family Medicine

## 2021-04-27 ENCOUNTER — Ambulatory Visit (INDEPENDENT_AMBULATORY_CARE_PROVIDER_SITE_OTHER): Payer: Medicare HMO | Admitting: Family Medicine

## 2021-04-27 VITALS — BP 126/82 | HR 78 | Temp 98.2°F | Resp 17 | Ht 61.0 in | Wt 197.0 lb

## 2021-04-27 DIAGNOSIS — M4316 Spondylolisthesis, lumbar region: Secondary | ICD-10-CM

## 2021-04-27 DIAGNOSIS — M5136 Other intervertebral disc degeneration, lumbar region: Secondary | ICD-10-CM

## 2021-04-27 DIAGNOSIS — M545 Low back pain, unspecified: Secondary | ICD-10-CM | POA: Diagnosis not present

## 2021-04-27 MED ORDER — MELOXICAM 7.5 MG PO TABS: 7.5000 mg | ORAL_TABLET | Freq: Every day | ORAL | 1 refills | Status: DC

## 2021-04-27 NOTE — Progress Notes (Signed)
Subjective:  Patient ID: Erika Wolf, female    DOB: Apr 26, 1956  Age: 65 y.o. MRN: [id number]  CC:  Chief Complaint  Patient presents with   Back Pain    Pt reports once she no longer had medication the pain was gone but once she no longer had meloxicam the pain returned to apx the same state as previous     HPI [facility] presents for   Left low back pain: Follow-up left low back pain, discussed November 2.  Symptoms present for 1 year at a time, no known injury, no prior surgery or treatment.  Suspect mechanical low back pain, possible treatment of DDD, started on meloxicam, Flexeril if needed.   Lumbar spine x-ray November 4: IMPRESSION: 1. Mild-to-moderate multilevel lumbar degenerative disc disease, most prominent at L1-2 and L3-4. 2. Moderate bilateral lower lumbar facet arthropathy with mild multilevel lumbar spondylolisthesis.  Symptoms improved on meloxicam,  not complete resolution.returned off meds few days later. Back at same level. No fever, weight loss, night sweats.  No bowel or bladder incontinence, no saddle anesthesia, no lower extremity weakness. No radiation to legs.     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   Past Medical History:  Diagnosis Date   HTN (hypertension)    Hypertension    [employer]rees[employer] 06/20/2020   Sleep apnea    [employer]reesia 06/20/2020   Urinary incontinence    cough, sneeze   Past Surgical History:  Procedure Laterality Date   CESAREAN SECTION  5 total   CESAREAN SECTION N/A    [employer] 06/20/2020   CHOLECYSTECTOMY     FRACTURE SURGERY N/A    [employer]  06/20/2020   GALLBLADDER SURGERY     JOINT REPLACEMENT N/A    [employer]reesia 06/20/2020   ROTATOR CUFF REPAIR     TOTAL ABDOMINAL HYSTERECTOMY  2005   Fibroid ovaries and tubes removed also   TOTAL HIP ARTHROPLASTY Right 10/18/2018   Procedure: TOTAL HIP ARTHROPLASTY ANTERIOR APPROACH;  Surgeon: Gaynelle Arabian, MD;  Location: [facility];  Service: Orthopedics;  Laterality: Right;  187min   No Known Allergies Prior to Admission medications   Medication Sig Start Date End Date Taking? Authorizing Provider  albuterol (VENTOLIN HFA) 108 (90 Base) MCG/ACT inhaler Inhale 2 puffs into the lungs every 6 (six) hours as needed for wheezing or shortness of breath. 07/03/20  Yes Just, Laurita Quint, FNP  amLODipine (NORVASC) 5 MG tablet Take 1 tablet (5 mg total) by mouth daily. 03/18/21  Yes Wendie Agreste, MD  calcium-vitamin D (OSCAL WITH D) 500-200 MG-UNIT TABS tablet Take by mouth.   Yes [provider]  cetirizine (ZYRTEC) 10 MG tablet Take 10 mg by mouth daily.   Yes [provider]  cyclobenzaprine (FLEXERIL) 5 MG tablet Take 1 tablet (5 mg total) by mouth 3 (three) times daily as needed for muscle spasms (start qhs prn due to sedation). 03/18/21  Yes Wendie Agreste, MD  fluticasone (FLONASE) 50 MCG/ACT nasal spray Place 1 Dose into the nose daily.   Yes [provider]  losartan-hydrochlorothiazide (HYZAAR) 100-25 MG tablet Take 1 tablet by mouth daily. 03/18/21  Yes Carlota Raspberry,  Asencion Partridge, MD  meloxicam (MOBIC) 7.5 MG tablet Take 1 tablet (7.5 mg total) by mouth daily. 03/18/21  Yes Shade Flood, MD  metoprolol tartrate (LOPRESSOR) 100 MG tablet Take 1 tablet (100 mg total) by mouth daily. 03/18/21  Yes Shade Flood, MD  omeprazole (PRILOSEC) 20 MG capsule Take 1 capsule (20 mg total) by mouth daily. 03/18/21  Yes Shade Flood, MD  venlafaxine XR (EFFEXOR XR) 75 MG 24 hr capsule Take 1 capsule (75 mg total) by mouth daily with breakfast. 04/21/20  Yes Patton Salles,  MD   Social History   Socioeconomic History   Marital status: Married    Spouse name: Not on file   Number of children: Not on file   Years of education: Not on file   Highest education level: Not on file  Occupational History   Occupation: homemaker  Tobacco Use   Smoking status: Never   Smokeless tobacco: Never  Vaping Use   Vaping Use: Never used  Substance and Sexual Activity   Alcohol use: Yes    Alcohol/week: 1.0 standard drink    Types: 1 Glasses of wine per week   Drug use: No   Sexual activity: Yes    Birth control/protection: Surgical    Comment: hysterectomy  Other Topics Concern   Not on file  Social History Narrative   Married;   5 children, 4 live in [place], 1 lives in [place].   2 grandchildren         Social Determinants of Corporate investment banker Strain: Not on file  Food Insecurity: Not on file  Transportation Needs: Not on file  Physical Activity: Not on file  Stress: Not on file  Social Connections: Not on file  Intimate Partner Violence: Not on file    Review of Systems Per HPI   Objective:   Vitals:   04/27/21 1344  BP: 126/82  Pulse: 78  Resp: 17  Temp: 98.2 F (36.8 C)  TempSrc: Temporal  SpO2: 96%  Weight: 197 lb (89.4 kg)  Height: 5\' 1"  (1.549 m)     Physical Exam Vitals reviewed.  Constitutional:      General: She is not in acute distress.    Appearance: Normal appearance. She is well-developed.  HENT:     Head: Normocephalic and atraumatic.  Cardiovascular:     Rate and Rhythm: Normal rate.  Pulmonary:     Effort: Pulmonary effort is normal.  Musculoskeletal:     Comments: Ambulating without difficulty, without use of assistive device  Neurological:     Mental Status: She is alert and oriented to person, place, and time.  Psychiatric:        Mood and Affect: Mood normal.       Assessment & Plan:  Erika Wolf is a 65 y.o. female . Spondylolisthesis of lumbar region - Plan: Ambulatory referral to  Spine Surgery, meloxicam (MOBIC) 7.5 MG tablet  Left-sided low back pain without sciatica, unspecified chronicity - Plan: Ambulatory referral to Spine Surgery, meloxicam (MOBIC) 7.5 MG tablet  DDD (degenerative disc disease), lumbar - Plan: Ambulatory referral to Spine Surgery, meloxicam (MOBIC) 7.5 MG tablet  Persistent low back pain with degenerative disc disease, multilevel spondylolisthesis as above.  Denies new symptoms or red flag symptoms.  Refilled meloxicam, refer to back specialist to decide on next step in treatment, including possible PT, injection or advanced imaging.  RTC precautions if worse or new symptoms prior to that appointment  Meds ordered this encounter  Medications   meloxicam (MOBIC) 7.5 MG tablet    Sig: Take 1 tablet (7.5 mg total) by mouth daily.    Dispense:  30 tablet    Refill:  1   Patient Instructions  Meloxicam was refilled, okay to take that once per day for now. I will refer you to back specialist to decide on the next step of treatment and to discuss the results of your x-ray.  If any new symptoms let me know.  Thanks for coming in today.    Signed,   Merri Ray, MD [place] 04/27/21 2:32 PM

## 2021-04-27 NOTE — Patient Instructions (Signed)
Meloxicam was refilled, okay to take that once per day for now. I will refer you to back specialist to decide on the next step of treatment and to discuss the results of your x-ray.  If any new symptoms let me know.  Thanks for coming in today.

## 2021-04-28 ENCOUNTER — Encounter: Payer: Self-pay | Admitting: Obstetrics and Gynecology

## 2021-04-28 ENCOUNTER — Other Ambulatory Visit: Payer: Self-pay

## 2021-04-28 ENCOUNTER — Ambulatory Visit (INDEPENDENT_AMBULATORY_CARE_PROVIDER_SITE_OTHER): Payer: Medicare HMO | Admitting: Obstetrics and Gynecology

## 2021-04-28 ENCOUNTER — Ambulatory Visit: Payer: PRIVATE HEALTH INSURANCE | Admitting: Obstetrics and Gynecology

## 2021-04-28 VITALS — BP 122/78 | HR 104 | Ht 60.5 in | Wt 196.0 lb

## 2021-04-28 DIAGNOSIS — N952 Postmenopausal atrophic vaginitis: Secondary | ICD-10-CM | POA: Diagnosis not present

## 2021-04-28 DIAGNOSIS — Z01419 Encounter for gynecological examination (general) (routine) without abnormal findings: Secondary | ICD-10-CM | POA: Diagnosis not present

## 2021-04-28 DIAGNOSIS — N951 Menopausal and female climacteric states: Secondary | ICD-10-CM

## 2021-04-28 DIAGNOSIS — Z5181 Encounter for therapeutic drug level monitoring: Secondary | ICD-10-CM | POA: Diagnosis not present

## 2021-04-28 DIAGNOSIS — N766 Ulceration of vulva: Secondary | ICD-10-CM

## 2021-04-28 DIAGNOSIS — N760 Acute vaginitis: Secondary | ICD-10-CM

## 2021-04-28 MED ORDER — ESTRADIOL 10 MCG VA TABS: 10.0000 ug | ORAL_TABLET | VAGINAL | 3 refills | Status: DC

## 2021-04-28 MED ORDER — TRIAMCINOLONE ACETONIDE 0.025 % EX OINT: 1.0000 "application " | TOPICAL_OINTMENT | Freq: Two times a day (BID) | CUTANEOUS | 0 refills | Status: DC

## 2021-04-28 MED ORDER — VENLAFAXINE HCL ER 75 MG PO CP24
75.0000 mg | ORAL_CAPSULE | Freq: Every day | ORAL | 3 refills | Status: DC
Start: 2021-04-28 — End: 2022-05-18

## 2021-04-28 NOTE — Patient Instructions (Signed)

## 2021-04-28 NOTE — Progress Notes (Signed)
65 y.o. [id number] Married Caucasian female here for breast and pelvic exam.    She is taking Effexor for menopausal symptoms.  Increased hot flashes starting this summer.  Her symptoms are annoying per patient.   She started using vaginal estradiol tablets just in November, 2022.  She is using it twice a week.  Vaginal dryness is better.   Some vaginal itching externally.  No discharge.  Some odor.    Leaks urine all the time.  Using pads.   No hx fever blisters.   PCP:  Meredith Staggers, MD   No LMP recorded. Patient has had a hysterectomy.           Sexually active: Yes.    The current method of family planning is status post hysterectomy.    Exercising: No.  The patient does not participate in regular exercise at present. Smoker:  no  Health Maintenance: Pap:  2015 normal History of abnormal Pap:  no MMG:  03-24-21 Neg/Birads1 Colonoscopy:  2020 normal;next due 2025 BMD: 03-30-11  Result :?Abnormal.  TDaP:  PCP Gardasil:   no HIV: no Hep C: no Screening Labs:  PCP   reports that she has never smoked. She has never used smokeless tobacco. She reports current alcohol use of about 3.0 standard drinks per week. She reports that she does not use drugs.  Past Medical History:  Diagnosis Date   HTN (hypertension)    Hypertension    [employer]ia 06/20/2020   Sleep apnea    [employer]reesia 06/20/2020   Urinary incontinence    cough, sneeze    Past Surgical History:  Procedure Laterality Date   CESAREAN SECTION  5 total   CESAREAN SECTION N/A    [employer]ia 06/20/2020   CHOLECYSTECTOMY     FRACTURE SURGERY N/A    [employer]ia 06/20/2020   GALLBLADDER SURGERY     JOINT REPLACEMENT N/A    [employer]reesia 06/20/2020   ROTATOR CUFF REPAIR     TOTAL ABDOMINAL HYSTERECTOMY  2005   Fibroid ovaries and tubes removed also   TOTAL HIP ARTHROPLASTY Right 10/18/2018   Procedure: TOTAL HIP ARTHROPLASTY ANTERIOR APPROACH;  Surgeon: Ollen Gross, MD;  Location: [facility];  Service: Orthopedics;   Laterality: Right;     Current Outpatient Medications  Medication Sig Dispense Refill   albuterol (VENTOLIN HFA) 108 (90 Base) MCG/ACT inhaler Inhale 2 puffs into the lungs every 6 (six) hours as needed for wheezing or shortness of breath. 8 g 3   amLODipine (NORVASC) 5 MG tablet Take 1 tablet (5 mg total) by mouth daily. 90 tablet 1   calcium-vitamin D (OSCAL WITH D) 500-200 MG-UNIT TABS tablet Take by mouth.     cetirizine (ZYRTEC) 10 MG tablet Take 10 mg by mouth daily.     cyclobenzaprine (FLEXERIL) 5 MG tablet Take 1 tablet (5 mg total) by mouth 3 (three) times daily as needed for muscle spasms (start qhs prn due to sedation). 15 tablet 1   Estradiol 10 MCG TABS vaginal tablet Place vaginally.     fluticasone (FLONASE) 50 MCG/ACT nasal spray Place 1 Dose into the nose daily.     losartan-hydrochlorothiazide (HYZAAR) 100-25 MG tablet Take 1 tablet by mouth daily. 90 tablet 1   meloxicam (MOBIC) 7.5 MG tablet Take 1 tablet (7.5 mg total) by mouth daily. 30 tablet 1   metoprolol tartrate (LOPRESSOR) 100 MG tablet Take 1 tablet (100 mg total) by mouth daily. 90 tablet 1   omeprazole (PRILOSEC) 20 MG capsule Take 1  capsule (20 mg total) by mouth daily. 90 capsule 1   venlafaxine XR (EFFEXOR XR) 75 MG 24 hr capsule Take 1 capsule (75 mg total) by mouth daily with breakfast. 90 capsule 3   No current facility-administered medications for this visit.    Family History  Problem Relation Age of Onset   Hyperlipidemia Mother    Hypertension Mother    Dementia Mother    Cancer Father        colon   Aneurysm Father        brain   Cancer Sister        ovarian   Hyperlipidemia Brother    Breast cancer Daughter     Review of Systems  Skin:        Hot flashes lately  All other systems reviewed and are negative.  Exam:   BP 122/78   Pulse (!) 104   Ht 5' 0.5" (1.537 m)   Wt 196 lb (88.9 kg)   SpO2 98%   BMI 37.65 kg/m     General appearance: alert, cooperative and appears  stated age Head: normocephalic, without obvious abnormality, atraumatic Neck: no adenopathy, supple, symmetrical, trachea midline and thyroid normal to inspection and palpation Lungs: clear to auscultation bilaterally Breasts: normal appearance, no masses or tenderness, No nipple retraction or dimpling, No nipple discharge or bleeding, No axillary adenopathy Heart: regular rate and rhythm Abdomen: soft, non-tender; no masses, no organomegaly Extremities: extremities normal, atraumatic, no cyanosis or edema Skin: skin color, texture, turgor normal. No rashes or lesions Lymph nodes: cervical, supraclavicular, and axillary nodes normal. Neurologic: grossly normal  Pelvic: External genitalia:  excoriation of the right labia majora.                No abnormal inguinal nodes palpated.              Urethra:  normal appearing urethra with no masses, tenderness or lesions              Bartholins and Skenes: normal                 Vagina: normal appearing vagina with normal color and discharge, no lesions              Cervix:absent              Pap taken: no Bimanual Exam:  Uterus:  absent              Adnexa: no mass, fullness, tenderness              Rectal exam: yes.  Confirms.              Anus:  normal sphincter tone, no lesions  Chaperone was present for exam:  yes.  Assessment:   Well woman visit with gynecologic exam. Status post TAH/BSO.  Menopausal symptoms.  Treated with Effexor HR.  Urinary incontinence.   FH breast, ovarian cancer, and colon cancer.  Sister had ovarian cancer. Daughter had breast cancer and had negative genetic testing.  Vaginal atrophy.   Vulvar ulceration/excoriation.  Medication monitoring encounter.   Plan: Mammogram screening discussed. Self breast awareness reviewed. Pap and HR HPV as above. Guidelines for Calcium, Vitamin D, regular exercise program including cardiovascular and weight bearing exercise. Try Jeffie Pollock with Melatonin if desired. Refill  Effexor XR 75 mg daily.   Reduce caffeine intake. She will get pelvic floor exercises from her daughter who is a PT. Continue Vagifem.  I discussed potential  effect on breast cancer.  HSV I and II PC swab.  Rx for Triamcinolone ointment 0.025% bid to vulva x 2 weeks as needed. Follow up annually and prn.   29 min  total time was spent for this patient encounter, including preparation, face-to-face counseling with the patient, coordination of care, and documentation of the encounter.   After visit summary provided.

## 2021-04-30 LAB — SURESWAB HSV, TYPE 1/2 DNA, PCR
HSV 1 DNA: NOT DETECTED
HSV 2 DNA: NOT DETECTED

## 2021-05-01 DIAGNOSIS — M5416 Radiculopathy, lumbar region: Secondary | ICD-10-CM | POA: Diagnosis not present

## 2021-05-20 ENCOUNTER — Telehealth: Payer: Medicare HMO | Admitting: Nurse Practitioner

## 2021-05-20 DIAGNOSIS — J40 Bronchitis, not specified as acute or chronic: Secondary | ICD-10-CM

## 2021-05-20 MED ORDER — PROMETHAZINE-DM 6.25-15 MG/5ML PO SYRP: 5.0000 mL | ORAL_SOLUTION | Freq: Four times a day (QID) | ORAL | 0 refills | Status: DC | PRN

## 2021-05-20 MED ORDER — PREDNISONE 20 MG PO TABS: 40.0000 mg | ORAL_TABLET | Freq: Every day | ORAL | 0 refills | Status: AC

## 2021-05-20 NOTE — Progress Notes (Signed)
We are sorry that you are not feeling well.  Here is how we plan to help!  Based on your presentation I believe you most likely have A cough due to a virus.  This is called viral bronchitis and is best treated by rest, plenty of fluids and control of the cough.  You may use Ibuprofen or Tylenol as directed to help your symptoms.     In addition you may use promethazine dextromethorphan 69ml every 6 hours prn ( sedation precautions). Cannot prescribe codiene cough meds in evisit.  Prednisone 20mg  2 tablets po dialy for 5 days  From your responses in the eVisit questionnaire you describe inflammation in the upper respiratory tract which is causing a significant cough.  This is commonly called Bronchitis and has four common causes:   Allergies Viral Infections Acid Reflux Bacterial Infection Allergies, viruses and acid reflux are treated by controlling symptoms or eliminating the cause. An example might be a cough caused by taking certain blood pressure medications. You stop the cough by changing the medication. Another example might be a cough caused by acid reflux. Controlling the reflux helps control the cough.  USE OF BRONCHODILATOR ("RESCUE") INHALERS: There is a risk from using your bronchodilator too frequently.  The risk is that over-reliance on a medication which only relaxes the muscles surrounding the breathing tubes can reduce the effectiveness of medications prescribed to reduce swelling and congestion of the tubes themselves.  Although you feel brief relief from the bronchodilator inhaler, your asthma may actually be worsening with the tubes becoming more swollen and filled with mucus.  This can delay other crucial treatments, such as oral steroid medications. If you need to use a bronchodilator inhaler daily, several times per day, you should discuss this with your provider.  There are probably better treatments that could be used to keep your asthma under control.     HOME CARE Only  take medications as instructed by your medical team. Complete the entire course of an antibiotic. Drink plenty of fluids and get plenty of rest. Avoid close contacts especially the very young and the elderly Cover your mouth if you cough or cough into your sleeve. Always remember to wash your hands A steam or ultrasonic humidifier can help congestion.   GET HELP RIGHT AWAY IF: You develop worsening fever. You become short of breath You cough up blood. Your symptoms persist after you have completed your treatment plan MAKE SURE YOU  Understand these instructions. Will watch your condition. Will get help right away if you are not doing well or get worse.    Thank you for choosing an e-visit.  Your e-visit answers were reviewed by a board certified advanced clinical practitioner to complete your personal care plan. Depending upon the condition, your plan could have included both over the counter or prescription medications.  Please review your pharmacy choice. Make sure the pharmacy is open so you can pick up prescription now. If there is a problem, you may contact your provider through and have the prescription routed to another pharmacy.  Your safety is important to [employer]. If you have drug allergies check your prescription carefully.   For the next 24 hours you can use MyChart to ask questions about today's visit, request a non-urgent call back, or ask for a work or school excuse. You will get an email in the next two days asking about your experience. I hope that your e-visit has been valuable and will speed your recovery.  5-10 minutes spent reviewing and documenting in chart.

## 2021-05-31 ENCOUNTER — Telehealth: Payer: Medicare HMO | Admitting: Nurse Practitioner

## 2021-05-31 DIAGNOSIS — J019 Acute sinusitis, unspecified: Secondary | ICD-10-CM | POA: Diagnosis not present

## 2021-05-31 MED ORDER — FLUTICASONE PROPIONATE 50 MCG/ACT NA SUSP
2.0000 | Freq: Every day | NASAL | 0 refills | Status: DC
Start: 2021-05-31 — End: 2021-08-20

## 2021-05-31 MED ORDER — AMOXICILLIN-POT CLAVULANATE 875-125 MG PO TABS: 1.0000 | ORAL_TABLET | Freq: Two times a day (BID) | ORAL | 0 refills | Status: AC

## 2021-05-31 NOTE — Progress Notes (Signed)
E-Visit for Sinus Problems  We are sorry that you are not feeling well.  Here is how we plan to help!  Based on what you have shared with me it looks like you have sinusitis.  Sinusitis is inflammation and infection in the sinus cavities of the head.  Based on your presentation I believe you most likely have Acute Bacterial Sinusitis.  This is an infection caused by bacteria and is treated with antibiotics. I have prescribed Augmentin 875mg /125mg  one tablet twice daily with food, for 7 days.  I am also going to prescribe Flonase nasal spray, use 2 sprays in each nostril daily until symptoms improve. You may use an oral decongestant such as Mucinex D or if you have glaucoma or high blood pressure use plain Mucinex. Saline nasal spray help and can safely be used as often as needed for congestion.  If you develop worsening sinus pain, fever or notice severe headache and vision changes, or if symptoms are not better after completion of antibiotic, please schedule an appointment with a health care provider.    Sinus infections are not as easily transmitted as other respiratory infection, however we still recommend that you avoid close contact with loved ones, especially the very young and elderly.  Remember to wash your hands thoroughly throughout the day as this is the number one way to prevent the spread of infection!  Home Care: Only take medications as instructed by your medical team. Complete the entire course of an antibiotic. Do not take these medications with alcohol. A steam or ultrasonic humidifier can help congestion.  You can place a towel over your head and breathe in the steam from hot water coming from a faucet. Avoid close contacts especially the very young and the elderly. Cover your mouth when you cough or sneeze. Always remember to wash your hands.  Get Help Right Away If: You develop worsening fever or sinus pain. You develop a severe head ache or visual changes. Your symptoms  persist after you have completed your treatment plan.  Make sure you Understand these instructions. Will watch your condition. Will get help right away if you are not doing well or get worse.  Thank you for choosing an e-visit.  Your e-visit answers were reviewed by a board certified advanced clinical practitioner to complete your personal care plan. Depending upon the condition, your plan could have included both over the counter or prescription medications.  Please review your pharmacy choice. Make sure the pharmacy is open so you can pick up prescription now. If there is a problem, you may contact your provider through and have the prescription routed to another pharmacy.  Your safety is important to [employer]. If you have drug allergies check your prescription carefully.   For the next 24 hours you can use MyChart to ask questions about today's visit, request a non-urgent call back, or ask for a work or school excuse. You will get an email in the next two days asking about your experience. I hope that your e-visit has been valuable and will speed your recovery.   I have spent at least 5 minutes reviewing and documenting in the patient's chart.

## 2021-06-08 DIAGNOSIS — M545 Low back pain, unspecified: Secondary | ICD-10-CM | POA: Diagnosis not present

## 2021-06-12 DIAGNOSIS — M533 Sacrococcygeal disorders, not elsewhere classified: Secondary | ICD-10-CM | POA: Diagnosis not present

## 2021-06-12 DIAGNOSIS — M545 Low back pain, unspecified: Secondary | ICD-10-CM | POA: Diagnosis not present

## 2021-06-15 ENCOUNTER — Other Ambulatory Visit: Payer: Self-pay | Admitting: Orthopedic Surgery

## 2021-06-15 DIAGNOSIS — M545 Low back pain, unspecified: Secondary | ICD-10-CM

## 2021-06-15 DIAGNOSIS — G8929 Other chronic pain: Secondary | ICD-10-CM

## 2021-06-16 ENCOUNTER — Other Ambulatory Visit: Payer: Self-pay | Admitting: Family Medicine

## 2021-06-16 DIAGNOSIS — M545 Low back pain, unspecified: Secondary | ICD-10-CM

## 2021-06-16 DIAGNOSIS — M5136 Other intervertebral disc degeneration, lumbar region: Secondary | ICD-10-CM

## 2021-06-16 DIAGNOSIS — M51369 Other intervertebral disc degeneration, lumbar region without mention of lumbar back pain or lower extremity pain: Secondary | ICD-10-CM

## 2021-06-16 DIAGNOSIS — M4316 Spondylolisthesis, lumbar region: Secondary | ICD-10-CM

## 2021-06-17 ENCOUNTER — Other Ambulatory Visit: Payer: Self-pay | Admitting: Family Medicine

## 2021-06-17 DIAGNOSIS — I10 Essential (primary) hypertension: Secondary | ICD-10-CM

## 2021-06-17 NOTE — Telephone Encounter (Signed)
Refilled once, but under the care of back specialist and should discuss further refills/plan with them.  Thanks.

## 2021-06-17 NOTE — Telephone Encounter (Signed)
Patient is requesting a refill of the following medications: Requested Prescriptions   Pending Prescriptions Disp Refills   meloxicam (MOBIC) 7.5 MG tablet [Pharmacy Med Name: MELOXICAM 7.5 MG TAB[*]] 30 tablet 1    Sig: TAKE ONE TABLET BY MOUTH ONE TIME DAILY    Date of patient request: 06/17/21 Last office visit: 04/27/21 Date of last refill: 04/27/21 Last refill amount: 30

## 2021-06-23 ENCOUNTER — Other Ambulatory Visit: Payer: Self-pay | Admitting: Family Medicine

## 2021-06-23 DIAGNOSIS — M4316 Spondylolisthesis, lumbar region: Secondary | ICD-10-CM

## 2021-06-23 DIAGNOSIS — M51369 Other intervertebral disc degeneration, lumbar region without mention of lumbar back pain or lower extremity pain: Secondary | ICD-10-CM

## 2021-06-23 DIAGNOSIS — M5136 Other intervertebral disc degeneration, lumbar region: Secondary | ICD-10-CM

## 2021-06-23 DIAGNOSIS — M545 Low back pain, unspecified: Secondary | ICD-10-CM

## 2021-07-08 ENCOUNTER — Other Ambulatory Visit: Payer: Self-pay

## 2021-07-08 ENCOUNTER — Ambulatory Visit
Admission: RE | Admit: 2021-07-08 | Discharge: 2021-07-08 | Disposition: A | Discharge status: Home / Self Care | Payer: Medicare HMO | Source: Ambulatory Visit | Attending: Orthopedic Surgery | Admitting: Orthopedic Surgery

## 2021-07-08 DIAGNOSIS — G8929 Other chronic pain: Secondary | ICD-10-CM

## 2021-07-08 DIAGNOSIS — M461 Sacroiliitis, not elsewhere classified: Secondary | ICD-10-CM | POA: Diagnosis not present

## 2021-07-08 DIAGNOSIS — M545 Low back pain, unspecified: Secondary | ICD-10-CM

## 2021-07-15 ENCOUNTER — Other Ambulatory Visit: Payer: Self-pay | Admitting: Family Medicine

## 2021-07-15 DIAGNOSIS — M4316 Spondylolisthesis, lumbar region: Secondary | ICD-10-CM

## 2021-07-15 DIAGNOSIS — M5136 Other intervertebral disc degeneration, lumbar region: Secondary | ICD-10-CM

## 2021-07-15 DIAGNOSIS — M545 Low back pain, unspecified: Secondary | ICD-10-CM

## 2021-07-16 NOTE — Telephone Encounter (Signed)
Patient is requesting a refill of the following medications: ?Requested Prescriptions  ? ?Pending Prescriptions Disp Refills  ? meloxicam (MOBIC) 7.5 MG tablet [Pharmacy Med Name: MELOXICAM 7.5 MG TAB[*]] 30 tablet 0  ?  Sig: TAKE ONE TABLET BY MOUTH ONE TIME DAILY  ? ? ?Date of patient request: 07/16/21 ?Last office visit: 04/27/21 ?Date of last refill: 06/17/21 ?Last refill amount: 30 ? ? ?

## 2021-07-16 NOTE — Telephone Encounter (Signed)
We will temporarily refill meloxicam as it appears there is a plan for possible injections with her back specialist. ?

## 2021-07-21 ENCOUNTER — Other Ambulatory Visit: Payer: Self-pay

## 2021-07-21 MED ORDER — TRIAMCINOLONE ACETONIDE 0.025 % EX OINT: 1.0000 "application " | TOPICAL_OINTMENT | Freq: Two times a day (BID) | CUTANEOUS | 0 refills | Status: DC

## 2021-07-21 NOTE — Telephone Encounter (Signed)
Last AEX 04/28/21. ? ?Note "Rx for Triamcinolone ointment 0.025% bid to vulva x 2 weeks as needed."  ? ?Please advise.  ?

## 2021-07-23 ENCOUNTER — Other Ambulatory Visit: Payer: Self-pay | Admitting: Family Medicine

## 2021-07-23 DIAGNOSIS — M5136 Other intervertebral disc degeneration, lumbar region: Secondary | ICD-10-CM

## 2021-07-23 DIAGNOSIS — M4316 Spondylolisthesis, lumbar region: Secondary | ICD-10-CM

## 2021-07-23 DIAGNOSIS — M545 Low back pain, unspecified: Secondary | ICD-10-CM

## 2021-07-31 DIAGNOSIS — M533 Sacrococcygeal disorders, not elsewhere classified: Secondary | ICD-10-CM | POA: Diagnosis not present

## 2021-08-05 ENCOUNTER — Other Ambulatory Visit: Payer: Self-pay | Admitting: Orthopedic Surgery

## 2021-08-06 ENCOUNTER — Telehealth (INDEPENDENT_AMBULATORY_CARE_PROVIDER_SITE_OTHER): Payer: Medicare HMO | Admitting: Family Medicine

## 2021-08-06 ENCOUNTER — Other Ambulatory Visit: Payer: Self-pay | Admitting: Orthopedic Surgery

## 2021-08-06 DIAGNOSIS — J019 Acute sinusitis, unspecified: Secondary | ICD-10-CM

## 2021-08-06 DIAGNOSIS — J069 Acute upper respiratory infection, unspecified: Secondary | ICD-10-CM | POA: Diagnosis not present

## 2021-08-06 DIAGNOSIS — H9201 Otalgia, right ear: Secondary | ICD-10-CM

## 2021-08-06 DIAGNOSIS — M533 Sacrococcygeal disorders, not elsewhere classified: Secondary | ICD-10-CM

## 2021-08-06 MED ORDER — AMOXICILLIN-POT CLAVULANATE 875-125 MG PO TABS: 1.0000 | ORAL_TABLET | Freq: Two times a day (BID) | ORAL | 0 refills | Status: DC

## 2021-08-06 NOTE — Progress Notes (Signed)
Virtual Visit via Video Note ? ?I connected with Erika Wolf on 08/06/21 at 11:46 AM by a video enabled telemedicine application and verified that I am speaking with the correct person using two identifiers.  ?Patient location: home, with husband - consent obtained to discuss PHI ?My location: office [place].  ?  ?I discussed the limitations, risks, security and privacy concerns of performing an evaluation and management service by telephone and the availability of in person appointments. I also discussed with the patient that there may be a patient responsible charge related to this service. The patient expressed understanding and agreed to proceed, consent obtained.  ? ?Chief complaint: ? ?Chief Complaint  ?Patient presents with  ? Cough  ?  Pt has had cough, congestion, sore throat, productive cough, ear pressure and decreased hearing, pt notes was around grandkids who were also sick notes they were all negative for COVID and Flu   ? ? ?History of Present Illness: ?Erika Wolf is a 66 y.o. female ? ?Cough, congestion ?Started 2 weeks ago - sneezing, sore throat, cough. Cough has worsened. Pain in R ear, with decreased hearing of R ear past 4 days. No discharge. Sinus pressure, with discolored nasal d/c this week. Some sinus HA.  ?Sick contacts with grandkids in [place]. They were negative for covid and flu.  ?No dyspnea, fever or chest pain.  ?Drinking fluids.  ?Tx: none.  ? ? ?Patient Active Problem List  ? Diagnosis Date Noted  ? Diverticulitis 03/18/2019  ? History of revision of total replacement of right hip joint 10/30/2018  ? OA (osteoarthritis) of hip 10/18/2018  ? History of hematuria 12/27/2013  ? Essential hypertension 12/27/2013  ? Surgical menopause on hormone replacement therapy 12/27/2013  ? Obesity (BMI 30.0-34.9) 12/27/2013  ? Postablative ovarian failure 12/27/2013  ? ?Past Medical History:  ?Diagnosis Date  ? HTN (hypertension)   ? Hypertension   ? [employer] 06/20/2020  ?  Sleep apnea   ? [employer] 06/20/2020  ? Urinary incontinence   ? cough, sneeze  ? ?Past Surgical History:  ?Procedure Laterality Date  ? CESAREAN SECTION  5 total  ? CESAREAN SECTION N/A   ? [employer] 06/20/2020  ? CHOLECYSTECTOMY    ? FRACTURE SURGERY N/A   ? [employer] 06/20/2020  ? GALLBLADDER SURGERY    ? JOINT REPLACEMENT N/A   ? [employer] 06/20/2020  ? ROTATOR CUFF REPAIR    ? TOTAL ABDOMINAL HYSTERECTOMY  2005  ? Fibroid ovaries and tubes removed also  ? TOTAL HIP ARTHROPLASTY Right 10/18/2018  ? Procedure: TOTAL HIP ARTHROPLASTY ANTERIOR APPROACH;  Surgeon: Ollen Gross, MD;  Location: [facility];  Service: Orthopedics;  Laterality: Right;   ? ?No Known Allergies ?Prior to Admission medications   ?Medication Sig Start Date End Date Taking? Authorizing Provider  ?amLODipine (NORVASC) 5 MG tablet Take 1 tablet (5 mg total) by mouth daily. 03/18/21  Yes Shade Flood, MD  ?calcium-vitamin D (OSCAL WITH D) 500-200 MG-UNIT TABS tablet Take by mouth.   Yes [provider]  ?cetirizine (ZYRTEC) 10 MG tablet Take 10 mg by mouth daily.   Yes [provider]  ?Estradiol 10 MCG TABS vaginal tablet Place 1 tablet (10 mcg total) vaginally 2 (two) times a week. 04/30/21  Yes Patton Salles, MD  ?losartan-hydrochlorothiazide (HYZAAR) 100-25 MG tablet Take 1 tablet by mouth daily. 03/18/21  Yes Shade Flood, MD  ?metoprolol tartrate (LOPRESSOR) 100 MG tablet Take 1 tablet (100 mg total) by  mouth daily. 03/18/21  Yes Shade FloodGreene, Davelle Anselmi R, MD  ?omeprazole (PRILOSEC) 20 MG capsule Take 1 capsule (20 mg total) by mouth daily. 03/18/21  Yes Shade FloodGreene, Trust Crago R, MD  ?triamcinolone (KENALOG) 0.025 % ointment Apply 1 application. topically 2 (two) times daily. Use for 2 weeks at a time if needed. 07/21/21  Yes Patton SallesAmundson C Silva, Brook E, MD  ?venlafaxine XR (EFFEXOR XR) 75 MG 24 hr capsule Take 1 capsule (75 mg total) by mouth daily with breakfast. 04/28/21  Yes Patton SallesAmundson C Silva, Brook E, MD  ?albuterol  (VENTOLIN HFA) 108 (90 Base) MCG/ACT inhaler Inhale 2 puffs into the lungs every 6 (six) hours as needed for wheezing or shortness of breath. ?Patient not taking: Reported on 08/06/2021 07/03/20   Just, Azalee CourseKelsea J, FNP  ?cyclobenzaprine (FLEXERIL) 5 MG tablet Take 1 tablet (5 mg total) by mouth 3 (three) times daily as needed for muscle spasms (start qhs prn due to sedation). ?Patient not taking: Reported on 08/06/2021 03/18/21   Shade FloodGreene, Carolena Fairbank R, MD  ?fluticasone [facility](FLONASE) 50 MCG/ACT nasal spray Place 1 Dose into the nose daily. ?Patient not taking: Reported on 08/06/2021    [provider]  ?fluticasone (FLONASE) 50 MCG/ACT nasal spray Place 2 sprays into both nostrils daily for 10 days. 05/31/21 06/10/21  Leath-Warren, Sadie Haberhristie J, NP  ?meloxicam (MOBIC) 7.5 MG tablet TAKE ONE TABLET BY MOUTH ONE TIME DAILY ?Patient not taking: Reported on 08/06/2021 07/16/21   Shade FloodGreene, Shimika Ames R, MD  ?promethazine-dextromethorphan (PROMETHAZINE-DM) 6.25-15 MG/5ML syrup Take 5 mLs by mouth 4 (four) times daily as needed for cough. ?Patient not taking: Reported on 08/06/2021 05/20/21   Bennie PieriniMartin, Mary-Margaret, FNP  ? ?Social History  ? ?Socioeconomic History  ? Marital status: Married  ?  Spouse name: Not on file  ? Number of children: Not on file  ? Years of education: Not on file  ? Highest education level: Not on file  ?Occupational History  ? Occupation: homemaker  ?Tobacco Use  ? Smoking status: Never  ? Smokeless tobacco: Never  ?Vaping Use  ? Vaping Use: Never used  ?Substance and Sexual Activity  ? Alcohol use: Yes  ?  Alcohol/week: 3.0 standard drinks  ?  Types: 3 Glasses of wine per week  ? Drug use: No  ? Sexual activity: Yes  ?  Birth control/protection: Surgical  ?  Comment: hysterectomy  ?Other Topics Concern  ? Not on file  ?Social History Narrative  ? Married;  ? 5 children, 4 live in [place], 1 lives [place]  ? 2 grandchildren  ?   ?   ? ?Social Determinants of Health  ? ?Financial Resource Strain: Not on file  ?Food  Insecurity: Not on file  ?Transportation Needs: Not on file  ?Physical Activity: Not on file  ?Stress: Not on file  ?Social Connections: Not on file  ?Intimate Partner Violence: Not on file  ? ? ?Observations/Objective: ?There were no vitals filed for this visit. ?Nontoxic appearance on video.  Sounds congested but speaking in full sentences without respiratory distress.  No audible wheeze or stridor.  Euthymic mood, all questions answered with understanding of plan expressed ? ?Assessment and Plan: ?Upper respiratory tract infection, unspecified type ? ?Acute sinusitis, recurrence not specified, unspecified location - Plan: amoxicillin-clavulanate (AUGMENTIN) 875-125 MG tablet ? ?Right ear pain - Plan: amoxicillin-clavulanate (AUGMENTIN) 875-125 MG tablet ?Likely initial viral URI, with secondary sinusitis, possible secondary right otitis media.  Start Augmentin.  Symptomatic care with Mucinex, saline nasal spray discussed.  Declined Tessalon Perles as minimal relief in the past.  ER, RTC precautions given if any worsening symptoms or ear symptoms or not improving next week. ? ?Follow Up Instructions: ?As needed ?  ?I discussed the assessment and treatment plan with the patient. The patient was provided an opportunity to ask questions and all were answered. The patient agreed with the plan and demonstrated an understanding of the instructions. ?  ?The patient was advised to call back or seek an in-person evaluation if the symptoms worsen or if the condition fails to improve as anticipated. ? ? ?Shade Flood, MD ? ?

## 2021-08-06 NOTE — Patient Instructions (Signed)
Sorry to hear that you are sick.  Try Mucinex or Mucinex DM over-the-counter for cough, start Augmentin antibiotic that will treat sinuses as well as a possible ear infection.  If ear pain/ear symptoms are not improving into next week or any worsening symptoms sooner, please be seen in person either in our office or through urgent care over the weekend if needed.  Make sure to drink plenty of fluids, rest, saline nasal spray can help with nasal congestion.  Let me know if there are questions and I hope you feel better soon. ? ? ?

## 2021-08-19 ENCOUNTER — Other Ambulatory Visit: Payer: Self-pay | Admitting: Family Medicine

## 2021-08-22 ENCOUNTER — Other Ambulatory Visit: Payer: Self-pay | Admitting: Family Medicine

## 2021-08-22 DIAGNOSIS — M4316 Spondylolisthesis, lumbar region: Secondary | ICD-10-CM

## 2021-08-22 DIAGNOSIS — M545 Low back pain, unspecified: Secondary | ICD-10-CM

## 2021-08-22 DIAGNOSIS — M5136 Other intervertebral disc degeneration, lumbar region: Secondary | ICD-10-CM

## 2021-08-22 DIAGNOSIS — M51369 Other intervertebral disc degeneration, lumbar region without mention of lumbar back pain or lower extremity pain: Secondary | ICD-10-CM

## 2021-08-24 NOTE — Telephone Encounter (Signed)
Followed by spine specialist.  We will temporarily refill Mobic but would anticipate less need once treatments provided by her specialist. ?

## 2021-08-24 NOTE — Telephone Encounter (Signed)
Patient is requesting a refill of the following medications: ?Requested Prescriptions  ? ?Pending Prescriptions Disp Refills  ? meloxicam (MOBIC) 7.5 MG tablet [Pharmacy Med Name: MELOXICAM 7.5 MG TAB[*]] 30 tablet 0  ?  Sig: TAKE ONE TABLET BY MOUTH ONE TIME DAILY  ? ? ?Date of patient request: 08/24/21 ?Last office visit: 04/17/22 ?Date of last refill: 07/16/21 ?Last refill amount: 30 ? ? ?

## 2021-08-25 DIAGNOSIS — S82101D Unspecified fracture of upper end of right tibia, subsequent encounter for closed fracture with routine healing: Secondary | ICD-10-CM | POA: Diagnosis not present

## 2021-08-26 ENCOUNTER — Ambulatory Visit
Admission: RE | Admit: 2021-08-26 | Discharge: 2021-08-26 | Disposition: A | Discharge status: Home / Self Care | Payer: Medicare HMO | Source: Ambulatory Visit | Attending: Orthopedic Surgery | Admitting: Orthopedic Surgery

## 2021-08-26 ENCOUNTER — Other Ambulatory Visit: Payer: Medicare HMO

## 2021-08-26 DIAGNOSIS — M461 Sacroiliitis, not elsewhere classified: Secondary | ICD-10-CM | POA: Diagnosis not present

## 2021-08-26 DIAGNOSIS — M533 Sacrococcygeal disorders, not elsewhere classified: Secondary | ICD-10-CM

## 2021-08-26 MED ORDER — METHYLPREDNISOLONE ACETATE 40 MG/ML INJ SUSP (RADIOLOG: 80.0000 mg | Freq: Once | INTRAMUSCULAR | Status: DC

## 2021-09-14 DIAGNOSIS — M533 Sacrococcygeal disorders, not elsewhere classified: Secondary | ICD-10-CM | POA: Diagnosis not present

## 2021-09-25 ENCOUNTER — Other Ambulatory Visit: Payer: Self-pay | Admitting: Family Medicine

## 2021-11-09 ENCOUNTER — Other Ambulatory Visit: Payer: Self-pay | Admitting: Family Medicine

## 2021-11-15 ENCOUNTER — Other Ambulatory Visit: Payer: Self-pay | Admitting: Family Medicine

## 2021-11-15 DIAGNOSIS — I10 Essential (primary) hypertension: Secondary | ICD-10-CM

## 2021-11-20 DIAGNOSIS — Z803 Family history of malignant neoplasm of breast: Secondary | ICD-10-CM | POA: Diagnosis not present

## 2021-11-20 DIAGNOSIS — N951 Menopausal and female climacteric states: Secondary | ICD-10-CM | POA: Diagnosis not present

## 2021-11-20 DIAGNOSIS — I10 Essential (primary) hypertension: Secondary | ICD-10-CM | POA: Diagnosis not present

## 2021-11-20 DIAGNOSIS — Z8249 Family history of ischemic heart disease and other diseases of the circulatory system: Secondary | ICD-10-CM | POA: Diagnosis not present

## 2021-11-20 DIAGNOSIS — I739 Peripheral vascular disease, unspecified: Secondary | ICD-10-CM | POA: Diagnosis not present

## 2021-11-20 DIAGNOSIS — G8929 Other chronic pain: Secondary | ICD-10-CM | POA: Diagnosis not present

## 2021-11-20 DIAGNOSIS — Z79899 Other long term (current) drug therapy: Secondary | ICD-10-CM | POA: Diagnosis not present

## 2021-11-20 DIAGNOSIS — Z6835 Body mass index (BMI) 35.0-35.9, adult: Secondary | ICD-10-CM | POA: Diagnosis not present

## 2021-11-20 DIAGNOSIS — J45909 Unspecified asthma, uncomplicated: Secondary | ICD-10-CM | POA: Diagnosis not present

## 2021-11-20 DIAGNOSIS — Z008 Encounter for other general examination: Secondary | ICD-10-CM | POA: Diagnosis not present

## 2022-01-06 ENCOUNTER — Other Ambulatory Visit: Payer: Self-pay | Admitting: Family Medicine

## 2022-01-27 ENCOUNTER — Other Ambulatory Visit: Payer: Medicare HMO

## 2022-02-02 DIAGNOSIS — M533 Sacrococcygeal disorders, not elsewhere classified: Secondary | ICD-10-CM | POA: Diagnosis not present

## 2022-02-03 ENCOUNTER — Other Ambulatory Visit: Payer: Self-pay | Admitting: Orthopedic Surgery

## 2022-02-03 DIAGNOSIS — M545 Low back pain, unspecified: Secondary | ICD-10-CM

## 2022-02-17 ENCOUNTER — Ambulatory Visit (INDEPENDENT_AMBULATORY_CARE_PROVIDER_SITE_OTHER): Payer: Medicare HMO | Admitting: Family Medicine

## 2022-02-17 ENCOUNTER — Encounter: Payer: Self-pay | Admitting: Family Medicine

## 2022-02-17 VITALS — BP 110/64 | HR 69 | Temp 98.2°F | Resp 17 | Ht 60.0 in | Wt 194.1 lb

## 2022-02-17 DIAGNOSIS — I10 Essential (primary) hypertension: Secondary | ICD-10-CM | POA: Diagnosis not present

## 2022-02-17 DIAGNOSIS — E785 Hyperlipidemia, unspecified: Secondary | ICD-10-CM | POA: Diagnosis not present

## 2022-02-17 DIAGNOSIS — Z13820 Encounter for screening for osteoporosis: Secondary | ICD-10-CM | POA: Diagnosis not present

## 2022-02-17 DIAGNOSIS — E2839 Other primary ovarian failure: Secondary | ICD-10-CM | POA: Diagnosis not present

## 2022-02-17 DIAGNOSIS — Z0000 Encounter for general adult medical examination without abnormal findings: Secondary | ICD-10-CM | POA: Diagnosis not present

## 2022-02-17 LAB — LIPID PANEL
Cholesterol: 191 mg/dL (ref 0–200)
HDL: 58.6 mg/dL (ref >39.00)
LDL Cholesterol: 98 mg/dL (ref 0–99)
NonHDL: 132.46
Total CHOL/HDL Ratio: 3
Triglycerides: 171 mg/dL — ABNORMAL HIGH (ref 0.0–149.0)
VLDL: 34.2 mg/dL (ref 0.0–40.0)

## 2022-02-17 LAB — COMPREHENSIVE METABOLIC PANEL
ALT: 20 U/L (ref 0–35)
AST: 23 U/L (ref 0–37)
Albumin: 4.5 g/dL (ref 3.5–5.2)
Alkaline Phosphatase: 73 U/L (ref 39–117)
BUN: 17 mg/dL (ref 6–23)
CO2: 33 mEq/L — ABNORMAL HIGH (ref 19–32)
Calcium: 10.2 mg/dL (ref 8.4–10.5)
Chloride: 100 mEq/L (ref 96–112)
Creatinine, Ser: 0.78 mg/dL (ref 0.40–1.20)
GFR: 79.47 mL/min (ref >60.00)
Glucose, Bld: 82 mg/dL (ref 70–99)
Potassium: 3.9 mEq/L (ref 3.5–5.1)
Sodium: 138 mEq/L (ref 135–145)
Total Bilirubin: 0.4 mg/dL (ref 0.2–1.2)
Total Protein: 8 g/dL (ref 6.0–8.3)

## 2022-02-17 MED ORDER — METOPROLOL TARTRATE 100 MG PO TABS: 100.0000 mg | ORAL_TABLET | Freq: Every day | ORAL | 3 refills | Status: AC

## 2022-02-17 MED ORDER — LOSARTAN POTASSIUM-HCTZ 100-25 MG PO TABS: 1.0000 | ORAL_TABLET | Freq: Every day | ORAL | 3 refills | Status: AC

## 2022-02-17 MED ORDER — AMLODIPINE BESYLATE 5 MG PO TABS: 5.0000 mg | ORAL_TABLET | Freq: Every day | ORAL | 3 refills | Status: AC

## 2022-02-17 NOTE — Patient Instructions (Signed)
I have placed a referral for bone density and mammogram, hopefully those can be both performed at [facility].  We will let you know.  If you have not heard from their office or someone from referrals in the next 2 weeks, let me know.  If any concerns on labs I will let you know but no change in medications for now.  Cholesterol level was not high enough to recommend medications previously, I will check that again today.  Thanks for coming in and let me know if there are any questions.  Health Maintenance After Age 38 After age 33, you are at a higher risk for certain long-term diseases and infections as well as injuries from falls. Falls are a major cause of broken bones and head injuries in people who are older than age 59. Getting regular preventive care can help to keep you healthy and well. Preventive care includes getting regular testing and making lifestyle changes as recommended by your health care provider. Talk with your health care provider about: Which screenings and tests you should have. A screening is a test that checks for a disease when you have no symptoms. A diet and exercise plan that is right for you. What should I know about screenings and tests to prevent falls? Screening and testing are the best ways to find a health problem early. Early diagnosis and treatment give you the best chance of managing medical conditions that are common after age 71. Certain conditions and lifestyle choices may make you more likely to have a fall. Your health care provider may recommend: Regular vision checks. Poor vision and conditions such as cataracts can make you more likely to have a fall. If you wear glasses, make sure to get your prescription updated if your vision changes. Medicine review. Work with your health care provider to regularly review all of the medicines you are taking, including over-the-counter medicines. Ask your health care provider about any side effects that may make you more likely  to have a fall. Tell your health care provider if any medicines that you take make you feel dizzy or sleepy. Strength and balance checks. Your health care provider may recommend certain tests to check your strength and balance while standing, walking, or changing positions. Foot health exam. Foot pain and numbness, as well as not wearing proper footwear, can make you more likely to have a fall. Screenings, including: Osteoporosis screening. Osteoporosis is a condition that causes the bones to get weaker and break more easily. Blood pressure screening. Blood pressure changes and medicines to control blood pressure can make you feel dizzy. Depression screening. You may be more likely to have a fall if you have a fear of falling, feel depressed, or feel unable to do activities that you used to do. Alcohol use screening. Using too much alcohol can affect your balance and may make you more likely to have a fall. Follow these instructions at home: Lifestyle Do not drink alcohol if: Your health care provider tells you not to drink. If you drink alcohol: Limit how much you have to: 0-1 drink a day for women. 0-2 drinks a day for men. Know how much alcohol is in your drink. In the [place] one drink equals one 12 oz bottle of beer (355 mL), one 5 oz glass of wine (148 mL), or one 1 oz glass of hard liquor (44 mL). Do not use any products that contain nicotine or tobacco. These products include cigarettes, chewing tobacco, and vaping devices, such  as e-cigarettes. If you need help quitting, ask your health care provider. Activity  Follow a regular exercise program to stay fit. This will help you maintain your balance. Ask your health care provider what types of exercise are appropriate for you. If you need a cane or walker, use it as recommended by your health care provider. Wear supportive shoes that have nonskid soles. Safety  Remove any tripping hazards, such as rugs, cords, and clutter. Install  safety equipment such as grab bars in bathrooms and safety rails on stairs. Keep rooms and walkways well-lit. General instructions Talk with your health care provider about your risks for falling. Tell your health care provider if: You fall. Be sure to tell your health care provider about all falls, even ones that seem minor. You feel dizzy, tiredness (fatigue), or off-balance. Take over-the-counter and prescription medicines only as told by your health care provider. These include supplements. Eat a healthy diet and maintain a healthy weight. A healthy diet includes low-fat dairy products, low-fat (lean) meats, and fiber from whole grains, beans, and lots of fruits and vegetables. Stay current with your vaccines. Schedule regular health, dental, and eye exams. Summary Having a healthy lifestyle and getting preventive care can help to protect your health and wellness after age 40. Screening and testing are the best way to find a health problem early and help you avoid having a fall. Early diagnosis and treatment give you the best chance for managing medical conditions that are more common for people who are older than age 78. Falls are a major cause of broken bones and head injuries in people who are older than age 8. Take precautions to prevent a fall at home. Work with your health care provider to learn what changes you can make to improve your health and wellness and to prevent falls. This information is not intended to replace advice given to you by your health care provider. Make sure you discuss any questions you have with your health care provider. Document Revised: 09/22/2020 Document Reviewed: 09/22/2020 Elsevier Patient Education  [employer].

## 2022-02-17 NOTE — Progress Notes (Signed)
Subjective:  Patient ID: Erika Wolf, female    DOB: [birth year]  Age: 66 y.o. MRN: [id number]  CC:  Chief Complaint  Patient presents with   Annual Exam    Annual exam  Doing well  No concerns today     HPI Erika Wolf presents for Annual Exam PCP, me Ortho/spine Dr. Lynann Bologna, sacrococcygeal disorder. Plan for SI surgery- fusion.  Gynecology, Dr. Quincy Simmonds, December 2022 visit.  Treated with estradiol ENT, Dr. Lucia Gaskins  Hypertension: HCTZ, losartan, metoprolol, amlodipine. Home readings: occasional.  BP Readings from Last 3 Encounters:  02/17/22 110/64  04/28/21 122/78  04/27/21 126/82   Lab Results  Component Value Date   CREATININE 0.78 06/27/2020   Hyperlipidemia: Diet/exercise approach, no current statin. No diet/exercise changes. SI issue limiting exercise.  No FH of early CAD The 10-year ASCVD risk score (Arnett DK, et al., 2019) is: 5.8%   Values used to calculate the score:     Age: 39 years     Sex: Female     Is Non-Hispanic African American: No     Diabetic: No     Tobacco smoker: No     Systolic Blood Pressure: 865 mmHg     Is BP treated: Yes     HDL Cholesterol: 52 mg/dL     Total Cholesterol: 206 mg/dL  Lab Results  Component Value Date   CHOL 206 (H) 06/27/2020   HDL 52 06/27/2020   LDLCALC 133 (H) 06/27/2020   TRIG 118 06/27/2020   CHOLHDL 4.0 06/27/2020   Lab Results  Component Value Date   ALT 22 06/27/2020   AST 16 06/27/2020   ALKPHOS 96 06/27/2020   BILITOT 0.3 06/27/2020    GERD: Omeprazole 20 mg as needed - only with ibuprofen - working well.   Effexor prescribed by GYN for hot flushes as well as estradiol.     02/17/2022    1:02 PM 04/27/2021    1:46 PM 03/18/2021    1:49 PM 06/27/2020   10:09 AM 06/18/2020   10:26 AM  Depression screen PHQ 2/9  Decreased Interest 0 0 0 0 0  Down, Depressed, Hopeless 0 0 0 0 0  PHQ - 2 Score 0 0 0 0 0  Altered sleeping 0      Tired, decreased energy 0      Change in appetite 0       Feeling bad or failure about yourself  0      Trouble concentrating 0      Moving slowly or fidgety/restless 0      Suicidal thoughts 0      PHQ-9 Score 0      Difficult doing work/chores Not difficult at all        Health Maintenance  Topic Date Due   DEXA SCAN  04/16/2021   PAP SMEAR-Modifier  11/28/2021   Hepatitis C Screening  03/18/2022 (Originally 04/16/1974)   HIV Screening  03/18/2022 (Originally 04/17/1971)   TETANUS/TDAP  04/27/2022 (Originally 04/17/1975)   Pneumonia Vaccine 16+ Years old (1 - PCV) 08/07/2022 (Originally 04/16/2021)   INFLUENZA VACCINE  08/15/2022 (Originally 12/15/2021)   MAMMOGRAM  03/24/2022   COLONOSCOPY (Pts 45-23yrs Insurance coverage will need to be confirmed)  02/27/2024   HPV VACCINES  Aged Out   COVID-19 Vaccine  Discontinued   Zoster Vaccines- Shingrix  Discontinued  Colonoscopy October 2020, repeat 5 years. Mammogram 03/24/2021.  Negative, repeat 1 year,  recommended calling to schedule.  Pap testing with GYN as above  Bone density testing: none prior.    Immunization History  Administered Date(s) Administered   Influenza,inj,quad, With Preservative 02/14/2018  Flu, COVID, tetanus vaccines declined, RSV vaccine declined.  HIV, hep C testing declined. Shingrix declined.   No results found. No recent optho visit - plans to schedule  Dental:Within Last 6 months  Alcohol: 2 drinks per week.   Tobacco:  none.   Exercise:limited by SI joint.    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/30/2018   History of hematuria 12/27/2013   Essential hypertension 12/27/2013   Surgical menopause on hormone replacement therapy 12/27/2013   Obesity (BMI 30.0-34.9) 12/27/2013   Postablative ovarian failure 12/27/2013   Past Medical History:  Diagnosis Date   HTN (hypertension)    Hypertension    Phreesia 06/20/2020   Sleep apnea     Phreesia 06/20/2020   Urinary incontinence    cough, sneeze   Past Surgical History:  Procedure Laterality Date   CESAREAN SECTION  5 total   CESAREAN SECTION N/A    Phreesia 06/20/2020   CHOLECYSTECTOMY     FRACTURE SURGERY N/A    Phreesia 06/20/2020   GALLBLADDER SURGERY     JOINT REPLACEMENT N/A    Phreesia 06/20/2020   ROTATOR CUFF REPAIR     TOTAL ABDOMINAL HYSTERECTOMY  2005   Fibroid ovaries and tubes removed also   TOTAL HIP ARTHROPLASTY Right 10/18/2018   Procedure: TOTAL HIP ARTHROPLASTY ANTERIOR APPROACH;  Surgeon: Gaynelle Arabian, MD;  Location: [facility];  Service: Orthopedics;  Laterality: Right;  155min   No Known Allergies Prior to Admission medications   Medication Sig Start Date End Date Taking? Authorizing Provider  albuterol (VENTOLIN HFA) 108 (90 Base) MCG/ACT inhaler Inhale 2 puffs into the lungs every 6 (six) hours as needed for wheezing or shortness of breath. 07/03/20  Yes Just, Laurita Quint, FNP  amLODipine (NORVASC) 5 MG tablet TAKE ONE TABLET BY MOUTH ONE TIME DAILY 11/16/21  Yes Wendie Agreste, MD  calcium-vitamin D (OSCAL WITH D) 500-200 MG-UNIT TABS tablet Take by mouth.   Yes [provider]  cetirizine (ZYRTEC) 10 MG tablet Take 10 mg by mouth daily.   Yes [provider]  Estradiol 10 MCG TABS vaginal tablet Place 1 tablet (10 mcg total) vaginally 2 (two) times a week. 04/30/21  Yes Nunzio Cobbs, MD  fluticasone [facility]) 50 MCG/ACT nasal spray USE TWO SPRAYS IN [facility] NOSTRIL ONE TIME DAILY FOR 10 DAYS 11/09/21  Yes Wendie Agreste, MD  losartan-hydrochlorothiazide [facility]) 100-25 MG tablet TAKE ONE TABLET BY MOUTH ONE TIME DAILY 11/16/21  Yes Wendie Agreste, MD  metoprolol tartrate (LOPRESSOR) 100 MG tablet TAKE ONE TABLET BY MOUTH ONE TIME DAILY 11/16/21  Yes Wendie Agreste, MD  omeprazole (PRILOSEC) 20 MG capsule Take 1 capsule (20 mg total) by mouth daily. 03/18/21  Yes Wendie Agreste, MD  triamcinolone (KENALOG) 0.025 %  ointment Apply 1 application. topically 2 (two) times daily. Use for 2 weeks at a time if needed. 07/21/21  Yes Nunzio Cobbs, MD  venlafaxine XR (EFFEXOR XR) 75 MG 24 hr capsule Take 1 capsule (75 mg total) by mouth daily with breakfast. 04/28/21  Yes Nunzio Cobbs, MD   Social History   Socioeconomic History   Marital status: Married    Spouse name: Not on file  Number of children: Not on file   Years of education: Not on file   Highest education level: Not on file  Occupational History   Occupation: homemaker  Tobacco Use   Smoking status: Never   Smokeless tobacco: Never  Vaping Use   Vaping Use: Never used  Substance and Sexual Activity   Alcohol use: Yes    Alcohol/week: 3.0 standard drinks of alcohol    Types: 3 Glasses of wine per week   Drug use: No   Sexual activity: Yes    Birth control/protection: Surgical    Comment: hysterectomy  Other Topics Concern   Not on file  Social History Narrative   Married;   5 children, 4 live in [place], 1 lives in [place].   2 grandchildren         Social Determinants of Radio broadcast assistant Strain: Not on file  Food Insecurity: Not on file  Transportation Needs: Not on file  Physical Activity: Not on file  Stress: Not on file  Social Connections: Not on file  Intimate Partner Violence: Not on file    Review of Systems 13 point review of systems per patient health survey noted.  Negative other than as indicated above or in HPI.    Objective:   Vitals:   02/17/22 1303  BP: 110/64  Pulse: 69  Resp: 17  Temp: 98.2 F (36.8 C)  TempSrc: Temporal  SpO2: 99%  Weight: 194 lb 2 oz (88.1 kg)  Height: 5' (1.524 m)     Physical Exam Constitutional:      Appearance: She is well-developed.  HENT:     Head: Normocephalic and atraumatic.     Right Ear: External ear normal.     Left Ear: External ear normal.  Eyes:     Conjunctiva/sclera: Conjunctivae normal.     Pupils: Pupils are  equal, round, and reactive to light.  Neck:     Thyroid: No thyromegaly.  Cardiovascular:     Rate and Rhythm: Normal rate and regular rhythm.     Heart sounds: Normal heart sounds. No murmur heard. Pulmonary:     Effort: Pulmonary effort is normal. No respiratory distress.     Breath sounds: Normal breath sounds. No wheezing.  Abdominal:     General: Bowel sounds are normal.     Palpations: Abdomen is soft.     Tenderness: There is no abdominal tenderness.  Musculoskeletal:        General: No tenderness. Normal range of motion.     Cervical back: Normal range of motion and neck supple.  Lymphadenopathy:     Cervical: No cervical adenopathy.  Skin:    General: Skin is warm and dry.     Findings: No rash.  Neurological:     Mental Status: She is alert and oriented to person, place, and time.  Psychiatric:        Behavior: Behavior normal.        Thought Content: Thought content normal.        Assessment & Plan:  Erika Wolf is a 66 y.o. female . Annual physical exam  - -anticipatory guidance as below in AVS, screening labs above. Health maintenance items as above in HPI discussed/recommended as applicable.   Estrogen deficiency - Plan: DG Bone Density Screening for osteoporosis - Plan: DG Bone Density  Essential hypertension - Plan: Comprehensive metabolic panel, amLODipine (NORVASC) 5 MG tablet, metoprolol tartrate (LOPRESSOR) 100 MG tablet  -  Stable, tolerating current regimen.  Medications refilled. Labs pending as above.   Hyperlipidemia, unspecified hyperlipidemia type - Plan: Comprehensive metabolic panel, Lipid panel  -ASCVD risk score not high enough to recommend meds previously.  Check updated labs and scoring to decide on med need.  No changes for now.  Meds ordered this encounter  Medications   amLODipine (NORVASC) 5 MG tablet    Sig: Take 1 tablet (5 mg total) by mouth daily.    Dispense:  90 tablet    Refill:  3   losartan-hydrochlorothiazide  (HYZAAR) 100-25 MG tablet    Sig: Take 1 tablet by mouth daily.    Dispense:  90 tablet    Refill:  3   metoprolol tartrate (LOPRESSOR) 100 MG tablet    Sig: Take 1 tablet (100 mg total) by mouth daily.    Dispense:  90 tablet    Refill:  3   Patient Instructions  I have placed a referral for bone density and mammogram, hopefully those can be both performed at [facility].  We will let you know.  If you have not heard from their office or someone from referrals in the next 2 weeks, let me know.  If any concerns on labs I will let you know but no change in medications for now.  Cholesterol level was not high enough to recommend medications previously, I will check that again today.  Thanks for coming in and let me know if there are any questions.  Health Maintenance After Age 61 After age 20, you are at a higher risk for certain long-term diseases and infections as well as injuries from falls. Falls are a major cause of broken bones and head injuries in people who are older than age 77. Getting regular preventive care can help to keep you healthy and well. Preventive care includes getting regular testing and making lifestyle changes as recommended by your health care provider. Talk with your health care provider about: Which screenings and tests you should have. A screening is a test that checks for a disease when you have no symptoms. A diet and exercise plan that is right for you. What should I know about screenings and tests to prevent falls? Screening and testing are the best ways to find a health problem early. Early diagnosis and treatment give you the best chance of managing medical conditions that are common after age 34. Certain conditions and lifestyle choices may make you more likely to have a fall. Your health care provider may recommend: Regular vision checks. Poor vision and conditions such as cataracts can make you more likely to have a fall. If you wear glasses, make sure to get your  prescription updated if your vision changes. Medicine review. Work with your health care provider to regularly review all of the medicines you are taking, including over-the-counter medicines. Ask your health care provider about any side effects that may make you more likely to have a fall. Tell your health care provider if any medicines that you take make you feel dizzy or sleepy. Strength and balance checks. Your health care provider may recommend certain tests to check your strength and balance while standing, walking, or changing positions. Foot health exam. Foot pain and numbness, as well as not wearing proper footwear, can make you more likely to have a fall. Screenings, including: Osteoporosis screening. Osteoporosis is a condition that causes the bones to get weaker and break more easily. Blood pressure screening. Blood pressure changes and medicines to control blood pressure can make you  feel dizzy. Depression screening. You may be more likely to have a fall if you have a fear of falling, feel depressed, or feel unable to do activities that you used to do. Alcohol use screening. Using too much alcohol can affect your balance and may make you more likely to have a fall. Follow these instructions at home: Lifestyle Do not drink alcohol if: Your health care provider tells you not to drink. If you drink alcohol: Limit how much you have to: 0-1 drink a day for women. 0-2 drinks a day for men. Know how much alcohol is in your drink. In the [place] one drink equals one 12 oz bottle of beer (355 mL), one 5 oz glass of wine (148 mL), or one 1 oz glass of hard liquor (44 mL). Do not use any products that contain nicotine or tobacco. These products include cigarettes, chewing tobacco, and vaping devices, such as e-cigarettes. If you need help quitting, ask your health care provider. Activity  Follow a regular exercise program to stay fit. This will help you maintain your balance. Ask your health  care provider what types of exercise are appropriate for you. If you need a cane or walker, use it as recommended by your health care provider. Wear supportive shoes that have nonskid soles. Safety  Remove any tripping hazards, such as rugs, cords, and clutter. Install safety equipment such as grab bars in bathrooms and safety rails on stairs. Keep rooms and walkways well-lit. General instructions Talk with your health care provider about your risks for falling. Tell your health care provider if: You fall. Be sure to tell your health care provider about all falls, even ones that seem minor. You feel dizzy, tiredness (fatigue), or off-balance. Take over-the-counter and prescription medicines only as told by your health care provider. These include supplements. Eat a healthy diet and maintain a healthy weight. A healthy diet includes low-fat dairy products, low-fat (lean) meats, and fiber from whole grains, beans, and lots of fruits and vegetables. Stay current with your vaccines. Schedule regular health, dental, and eye exams. Summary Having a healthy lifestyle and getting preventive care can help to protect your health and wellness after age 60. Screening and testing are the best way to find a health problem early and help you avoid having a fall. Early diagnosis and treatment give you the best chance for managing medical conditions that are more common for people who are older than age 79. Falls are a major cause of broken bones and head injuries in people who are older than age 23. Take precautions to prevent a fall at home. Work with your health care provider to learn what changes you can make to improve your health and wellness and to prevent falls. This information is not intended to replace advice given to you by your health care provider. Make sure you discuss any questions you have with your health care provider. Document Revised: 09/22/2020 Document Reviewed: 09/22/2020 Elsevier  Patient Education  [place],   Merri Ray, MD [place] 02/17/22 1:14 PM

## 2022-02-18 ENCOUNTER — Other Ambulatory Visit: Payer: Self-pay

## 2022-02-18 ENCOUNTER — Telehealth: Payer: Self-pay | Admitting: Family Medicine

## 2022-02-18 DIAGNOSIS — Z1231 Encounter for screening mammogram for malignant neoplasm of breast: Secondary | ICD-10-CM

## 2022-02-18 NOTE — Telephone Encounter (Signed)
Sent orders to [facility]

## 2022-02-18 NOTE — Telephone Encounter (Signed)
Can we fax over the orders for the bone density and mammogram to [facility] - prior patient.

## 2022-03-13 DIAGNOSIS — G4733 Obstructive sleep apnea (adult) (pediatric): Secondary | ICD-10-CM | POA: Diagnosis not present

## 2022-03-17 ENCOUNTER — Ambulatory Visit
Admission: RE | Admit: 2022-03-17 | Discharge: 2022-03-17 | Disposition: A | Discharge status: Home / Self Care | Payer: Medicare HMO | Source: Ambulatory Visit | Attending: Orthopedic Surgery | Admitting: Orthopedic Surgery

## 2022-03-17 DIAGNOSIS — M533 Sacrococcygeal disorders, not elsewhere classified: Secondary | ICD-10-CM | POA: Diagnosis not present

## 2022-03-17 DIAGNOSIS — G8929 Other chronic pain: Secondary | ICD-10-CM

## 2022-04-13 DIAGNOSIS — G4733 Obstructive sleep apnea (adult) (pediatric): Secondary | ICD-10-CM | POA: Diagnosis not present

## 2022-04-15 DIAGNOSIS — Z780 Asymptomatic menopausal state: Secondary | ICD-10-CM | POA: Diagnosis not present

## 2022-04-15 DIAGNOSIS — M8589 Other specified disorders of bone density and structure, multiple sites: Secondary | ICD-10-CM | POA: Diagnosis not present

## 2022-04-15 DIAGNOSIS — Z1231 Encounter for screening mammogram for malignant neoplasm of breast: Secondary | ICD-10-CM | POA: Diagnosis not present

## 2022-04-15 LAB — HM DEXA SCAN

## 2022-04-15 LAB — HM MAMMOGRAPHY

## 2022-04-16 ENCOUNTER — Telehealth: Payer: Self-pay | Admitting: Family Medicine

## 2022-04-16 DIAGNOSIS — M85852 Other specified disorders of bone density and structure, left thigh: Secondary | ICD-10-CM

## 2022-04-16 NOTE — Telephone Encounter (Signed)
Bone density test results received from [facility] mammography, date of exam 04/15/2022.  Indicated osteopenia or thinning bones but not true osteoporosis.  I do not recommend any new medications or prescription treatments for thin bones at this time but should make sure she is obtaining at least 1200 to 1500 mg of calcium per day, 1000 units of vitamin D per day.  Repeat test in 2 years.  Let patient know if there are questions.  Signed results placed in scan bin.

## 2022-04-20 ENCOUNTER — Encounter: Payer: Self-pay | Admitting: Family Medicine

## 2022-04-21 NOTE — Telephone Encounter (Signed)
Called and explained to the patient your instructions.

## 2022-04-21 NOTE — Telephone Encounter (Signed)
I do not see that call has been placed yet. Thanks.

## 2022-04-24 ENCOUNTER — Other Ambulatory Visit: Payer: Self-pay | Admitting: Family Medicine

## 2022-05-13 DIAGNOSIS — G4733 Obstructive sleep apnea (adult) (pediatric): Secondary | ICD-10-CM | POA: Diagnosis not present

## 2022-05-18 ENCOUNTER — Other Ambulatory Visit: Payer: Self-pay | Admitting: Obstetrics and Gynecology

## 2022-05-18 DIAGNOSIS — Z5181 Encounter for therapeutic drug level monitoring: Secondary | ICD-10-CM

## 2022-05-18 MED ORDER — VENLAFAXINE HCL ER 75 MG PO CP24: 75.0000 mg | ORAL_CAPSULE | Freq: Every day | ORAL | 0 refills | Status: DC

## 2022-05-21 DIAGNOSIS — M533 Sacrococcygeal disorders, not elsewhere classified: Secondary | ICD-10-CM | POA: Diagnosis not present

## 2022-05-26 NOTE — Progress Notes (Signed)
67 y.o. [id number] Married Caucasian female here for an office visit.  She desires a breast and pelvic exam today.  ABN signed.   She is treated with Effexor for menopausal symptoms and not using Vagifem for vaginal atrophy.  Not sexually active.   She is wants to taper off the Effexor.   No problems with it.   Having vulvar irritation, especially at night.  Using Triamcinolone and it is not helping.  Using a pad every day.  Moving out [place] in the Spring this year.  Not certain exactly when.   PCP:   Merri Ray, MD  No LMP recorded. Patient has had a hysterectomy.           Sexually active: Yes.    The current method of family planning is status post hysterectomy.    Exercising: Yes.     Walking. Smoker:  no  Health Maintenance: Pap:  2015 normal History of abnormal Pap:  no MMG:  04/15/22, pt reports normal at [facility].  Colonoscopy:  2020 normal, due 2025 BMD:   04/15/22  Result  osteopenia TDaP:  PCP Gardasil:   no HIV: no Hep C: no Screening Labs:  PCP  reports that she has never smoked. She has never used smokeless tobacco. She reports current alcohol use of about 3.0 standard drinks of alcohol per week. She reports that she does not use drugs.  Past Medical History:  Diagnosis Date   HTN (hypertension)    Hypertension    Phreesia 06/20/2020   Sleep apnea    Phreesia 06/20/2020   Urinary incontinence    cough, sneeze    Past Surgical History:  Procedure Laterality Date   CESAREAN SECTION  5 total   CESAREAN SECTION N/A    Phreesia 06/20/2020   CHOLECYSTECTOMY     FRACTURE SURGERY N/A    Phreesia 06/20/2020   GALLBLADDER SURGERY     JOINT REPLACEMENT N/A    Phreesia 06/20/2020   ROTATOR CUFF REPAIR     TOTAL ABDOMINAL HYSTERECTOMY  2005   Fibroid ovaries and tubes removed also   TOTAL HIP ARTHROPLASTY Right 10/18/2018   Procedure: TOTAL HIP ARTHROPLASTY ANTERIOR APPROACH;  Surgeon: Gaynelle Arabian, MD;  Location: [facility];  Service: Orthopedics;   Laterality: Right;  185min    Current Outpatient Medications  Medication Sig Dispense Refill   albuterol (VENTOLIN HFA) 108 (90 Base) MCG/ACT inhaler Inhale 2 puffs into the lungs every 6 (six) hours as needed for wheezing or shortness of breath. 8 g 3   amLODipine (NORVASC) 5 MG tablet Take 1 tablet (5 mg total) by mouth daily. 90 tablet 3   calcium-vitamin D (OSCAL WITH D) 500-200 MG-UNIT TABS tablet Take by mouth.     cetirizine (ZYRTEC) 10 MG tablet Take 10 mg by mouth daily.     fluticasone (FLONASE) 50 MCG/ACT nasal spray USE TWO SPRAYS IN EACH NOSTRIL ONE TIME DAILY FOR 10 DAYS 16 mL 0   losartan-hydrochlorothiazide (HYZAAR) 100-25 MG tablet Take 1 tablet by mouth daily. 90 tablet 3   metoprolol tartrate (LOPRESSOR) 100 MG tablet Take 1 tablet (100 mg total) by mouth daily. 90 tablet 3   omeprazole (PRILOSEC) 20 MG capsule Take 1 capsule (20 mg total) by mouth daily. 90 capsule 1   triamcinolone (KENALOG) 0.025 % ointment Apply 1 application. topically 2 (two) times daily. Use for 2 weeks at a time if needed. 30 g 0   venlafaxine XR (EFFEXOR XR) 37.5 MG 24 hr capsule Take 1  capsule (37.5 mg total) by mouth daily. 30 capsule 11   No current facility-administered medications for this visit.    Family History  Problem Relation Age of Onset   Hyperlipidemia Mother    Hypertension Mother    Dementia Mother    Cancer Father        colon   Aneurysm Father        brain   Cancer Sister        ovarian   Hyperlipidemia Brother    Breast cancer Daughter     Review of Systems  All other systems reviewed and are negative.   Exam:   BP 120/76   Pulse 84   Ht 5' 1.5" (1.562 m)   Wt 190 lb (86.2 kg)   SpO2 96%   BMI 35.32 kg/m     General appearance: alert, cooperative and appears stated age Head: normocephalic, without obvious abnormality, atraumatic Neck: no adenopathy, supple, symmetrical, trachea midline and thyroid normal to inspection and palpation Lungs: clear to  auscultation bilaterally Breasts: normal appearance, no masses or tenderness, No nipple retraction or dimpling, No nipple discharge or bleeding, No axillary adenopathy Heart: regular rate and rhythm Abdomen: soft, non-tender; no masses, no organomegaly Extremities: extremities normal, atraumatic, no cyanosis or edema Skin: skin color, texture, turgor normal. No rashes or lesions Lymph nodes: cervical, supraclavicular, and axillary nodes normal. Neurologic: grossly normal  Pelvic: External genitalia: left labia majora with patch of erythema and some excoriation.  Right labia majora with smaller area of erythema.               No abnormal inguinal nodes palpated.              Urethra:  normal appearing urethra with no masses, tenderness or lesions              Bartholins and Skenes: normal                 Vagina: atrophy and orange discharge noted.               Cervix: absent              Pap taken: no Bimanual Exam:  Uterus:  normal size, contour, position, consistency, mobility, non-tender              Adnexa: no mass, fullness, tenderness              Rectal exam: yes.  Confirms.              Anus:  normal sphincter tone, no lesions  Chaperone was present for exam:  Emily  Assessment:   Well woman visit with gynecologic exam. Status post TAH/BSO.  Menopausal symptoms.  Treated with Effexor HR.  FH breast, ovarian cancer, and colon cancer.  Sister had ovarian cancer. Daughter had breast cancer and had negative genetic testing.  Vaginal atrophy.   Medication monitoring encounter.  Chronic vulvitis.  Excoriated left labia majora.  Plan: Mammogram screening discussed.  Will get copy of report.  Self breast awareness reviewed. Guidelines for Calcium, Vitamin D, regular exercise program including cardiovascular and weight bearing exercise. Reduce Effexor XR to 37.5 mg daily. Mycolog II.  Switch to cotton based pads.  Fu in 6 weeks for a med recheck and possible vulvar biopsy.   Procedure and rationale explained.  Follow up annually and prn.   After visit summary provided.   20 min  total time was spent for this patient encounter,  including preparation, face-to-face counseling with the patient, coordination of care, and documentation of the encounter in addition to doing breast and pelvic exam.

## 2022-05-27 DIAGNOSIS — M533 Sacrococcygeal disorders, not elsewhere classified: Secondary | ICD-10-CM | POA: Diagnosis not present

## 2022-06-01 DIAGNOSIS — M533 Sacrococcygeal disorders, not elsewhere classified: Secondary | ICD-10-CM | POA: Diagnosis not present

## 2022-06-08 ENCOUNTER — Ambulatory Visit (INDEPENDENT_AMBULATORY_CARE_PROVIDER_SITE_OTHER): Payer: Medicare HMO | Admitting: Obstetrics and Gynecology

## 2022-06-08 ENCOUNTER — Encounter: Payer: Self-pay | Admitting: Obstetrics and Gynecology

## 2022-06-08 VITALS — BP 120/76 | HR 84 | Ht 61.5 in | Wt 190.0 lb

## 2022-06-08 DIAGNOSIS — N952 Postmenopausal atrophic vaginitis: Secondary | ICD-10-CM

## 2022-06-08 DIAGNOSIS — Z01419 Encounter for gynecological examination (general) (routine) without abnormal findings: Secondary | ICD-10-CM

## 2022-06-08 DIAGNOSIS — N951 Menopausal and female climacteric states: Secondary | ICD-10-CM | POA: Diagnosis not present

## 2022-06-08 DIAGNOSIS — N763 Subacute and chronic vulvitis: Secondary | ICD-10-CM | POA: Diagnosis not present

## 2022-06-08 DIAGNOSIS — M533 Sacrococcygeal disorders, not elsewhere classified: Secondary | ICD-10-CM | POA: Diagnosis not present

## 2022-06-08 DIAGNOSIS — Z5181 Encounter for therapeutic drug level monitoring: Secondary | ICD-10-CM | POA: Diagnosis not present

## 2022-06-08 MED ORDER — NYSTATIN-TRIAMCINOLONE 100000-0.1 UNIT/GM-% EX OINT: 1.0000 | TOPICAL_OINTMENT | Freq: Two times a day (BID) | CUTANEOUS | 0 refills | Status: AC

## 2022-06-08 MED ORDER — VENLAFAXINE HCL ER 37.5 MG PO CP24: 37.5000 mg | ORAL_CAPSULE | Freq: Every day | ORAL | 11 refills | Status: AC

## 2022-06-10 DIAGNOSIS — M533 Sacrococcygeal disorders, not elsewhere classified: Secondary | ICD-10-CM | POA: Diagnosis not present

## 2022-06-17 DIAGNOSIS — M533 Sacrococcygeal disorders, not elsewhere classified: Secondary | ICD-10-CM | POA: Diagnosis not present

## 2022-06-30 ENCOUNTER — Ambulatory Visit (INDEPENDENT_AMBULATORY_CARE_PROVIDER_SITE_OTHER): Payer: Medicare HMO

## 2022-06-30 ENCOUNTER — Ambulatory Visit: Payer: Medicare HMO

## 2022-06-30 VITALS — Wt 190.0 lb

## 2022-06-30 DIAGNOSIS — Z0000 Encounter for general adult medical examination without abnormal findings: Secondary | ICD-10-CM | POA: Diagnosis not present

## 2022-06-30 NOTE — Patient Instructions (Signed)
Erika Wolf , Thank you for taking time to come for your Medicare Wellness Visit. I appreciate your ongoing commitment to your health goals. Please review the following plan we discussed and let me know if I can assist you in the future.   These are the goals we discussed:  Goals      Patient Stated     Decrease blood pressure medications         This is a list of the screening recommended for you and due dates:  Health Maintenance  Topic Date Due   Hepatitis C Screening: USPSTF Recommendation to screen - Ages 35-79 yo.  Never done   DTaP/Tdap/Td vaccine (1 - Tdap) Never done   Pneumonia Vaccine (1 of 1 - PCV) 08/07/2022*   Flu Shot  08/15/2022*   Mammogram  04/16/2023   Medicare Annual Wellness Visit  07/01/2023   Colon Cancer Screening  02/27/2024   DEXA scan (bone density measurement)  Completed   HPV Vaccine  Aged Out   COVID-19 Vaccine  Discontinued   Zoster (Shingles) Vaccine  Discontinued  *Topic was postponed. The date shown is not the original due date.    Advanced directives: Please bring a copy of your health care power of attorney and living will to the office at your convenience.   Conditions/risks identified: decrease blood pressure medications   Next appointment: Follow up in one year for your annual wellness visit    Preventive Care 65 Years and Older, Female Preventive care refers to lifestyle choices and visits with your health care provider that can promote health and wellness. What does preventive care include? A yearly physical exam. This is also called an annual well check. Dental exams once or twice a year. Routine eye exams. Ask your health care provider how often you should have your eyes checked. Personal lifestyle choices, including: Daily care of your teeth and gums. Regular physical activity. Eating a healthy diet. Avoiding tobacco and drug use. Limiting alcohol use. Practicing safe sex. Taking low-dose aspirin every day. Taking  vitamin and mineral supplements as recommended by your health care provider. What happens during an annual well check? The services and screenings done by your health care provider during your annual well check will depend on your age, overall health, lifestyle risk factors, and family history of disease. Counseling  Your health care provider may ask you questions about your: Alcohol use. Tobacco use. Drug use. Emotional well-being. Home and relationship well-being. Sexual activity. Eating habits. History of falls. Memory and ability to understand (cognition). Work and work Statistician. Reproductive health. Screening  You may have the following tests or measurements: Height, weight, and BMI. Blood pressure. Lipid and cholesterol levels. These may be checked every 5 years, or more frequently if you are over 36 years old. Skin check. Lung cancer screening. You may have this screening every year starting at age 34 if you have a 30-pack-year history of smoking and currently smoke or have quit within the past 15 years. Fecal occult blood test (FOBT) of the stool. You may have this test every year starting at age 39. Flexible sigmoidoscopy or colonoscopy. You may have a sigmoidoscopy every 5 years or a colonoscopy every 10 years starting at age 79. Hepatitis C blood test. Hepatitis B blood test. Sexually transmitted disease (STD) testing. Diabetes screening. This is done by checking your blood sugar (glucose) after you have not eaten for a while (fasting). You may have this done every 1-3 years. Bone density scan. This  is done to screen for osteoporosis. You may have this done starting at age 2. Mammogram. This may be done every 1-2 years. Talk to your health care provider about how often you should have regular mammograms. Talk with your health care provider about your test results, treatment options, and if necessary, the need for more tests. Vaccines  Your health care provider may  recommend certain vaccines, such as: Influenza vaccine. This is recommended every year. Tetanus, diphtheria, and acellular pertussis (Tdap, Td) vaccine. You may need a Td booster every 10 years. Zoster vaccine. You may need this after age 67. Pneumococcal 13-valent conjugate (PCV13) vaccine. One dose is recommended after age 21. Pneumococcal polysaccharide (PPSV23) vaccine. One dose is recommended after age 57. Talk to your health care provider about which screenings and vaccines you need and how often you need them. This information is not intended to replace advice given to you by your health care provider. Make sure you discuss any questions you have with your health care provider. Document Released: 05/30/2015 Document Revised: 01/21/2016 Document Reviewed: 03/04/2015 Elsevier Interactive Patient Education  2017 [place] Prevention in the Home Falls can cause injuries. They can happen to people of all ages. There are many things you can do to make your home safe and to help prevent falls. What can I do on the outside of my home? Regularly fix the edges of walkways and driveways and fix any cracks. Remove anything that might make you trip as you walk through a door, such as a raised step or threshold. Trim any bushes or trees on the path to your home. Use bright outdoor lighting. Clear any walking paths of anything that might make someone trip, such as rocks or tools. Regularly check to see if handrails are loose or broken. Make sure that both sides of any steps have handrails. Any raised decks and porches should have guardrails on the edges. Have any leaves, snow, or ice cleared regularly. Use sand or salt on walking paths during winter. Clean up any spills in your garage right away. This includes oil or grease spills. What can I do in the bathroom? Use night lights. Install grab bars by the toilet and in the tub and shower. Do not use towel bars as grab bars. Use non-skid  mats or decals in the tub or shower. If you need to sit down in the shower, use a plastic, non-slip stool. Keep the floor dry. Clean up any water that spills on the floor as soon as it happens. Remove soap buildup in the tub or shower regularly. Attach bath mats securely with double-sided non-slip rug tape. Do not have throw rugs and other things on the floor that can make you trip. What can I do in the bedroom? Use night lights. Make sure that you have a light by your bed that is easy to reach. Do not use any sheets or blankets that are too big for your bed. They should not hang down onto the floor. Have a firm chair that has side arms. You can use this for support while you get dressed. Do not have throw rugs and other things on the floor that can make you trip. What can I do in the kitchen? Clean up any spills right away. Avoid walking on wet floors. Keep items that you use a lot in easy-to-reach places. If you need to reach something above you, use a strong step stool that has a grab bar. Keep electrical cords out  of the way. Do not use floor polish or wax that makes floors slippery. If you must use wax, use non-skid floor wax. Do not have throw rugs and other things on the floor that can make you trip. What can I do with my stairs? Do not leave any items on the stairs. Make sure that there are handrails on both sides of the stairs and use them. Fix handrails that are broken or loose. Make sure that handrails are as long as the stairways. Check any carpeting to make sure that it is firmly attached to the stairs. Fix any carpet that is loose or worn. Avoid having throw rugs at the top or bottom of the stairs. If you do have throw rugs, attach them to the floor with carpet tape. Make sure that you have a light switch at the top of the stairs and the bottom of the stairs. If you do not have them, ask someone to add them for you. What else can I do to help prevent falls? Wear shoes  that: Do not have high heels. Have rubber bottoms. Are comfortable and fit you well. Are closed at the toe. Do not wear sandals. If you use a stepladder: Make sure that it is fully opened. Do not climb a closed stepladder. Make sure that both sides of the stepladder are locked into place. Ask someone to hold it for you, if possible. Clearly mark and make sure that you can see: Any grab bars or handrails. First and last steps. Where the edge of each step is. Use tools that help you move around (mobility aids) if they are needed. These include: Canes. Walkers. Scooters. Crutches. Turn on the lights when you go into a dark area. Replace any light bulbs as soon as they burn out. Set up your furniture so you have a clear path. Avoid moving your furniture around. If any of your floors are uneven, fix them. If there are any pets around you, be aware of where they are. Review your medicines with your doctor. Some medicines can make you feel dizzy. This can increase your chance of falling. Ask your doctor what other things that you can do to help prevent falls. This information is not intended to replace advice given to you by your health care provider. Make sure you discuss any questions you have with your health care provider. Document Released: 02/27/2009 Document Revised: 10/09/2015 Document Reviewed: 06/07/2014 Elsevier Interactive Patient Education  2017 [employer].

## 2022-06-30 NOTE — Progress Notes (Signed)
I connected with  Erika Wolf on 06/30/22 by a audio enabled telemedicine application and verified that I am speaking with the correct person using two identifiers.  Patient Location: Home  Provider Location: Home Office  I discussed the limitations of evaluation and management by telemedicine. The patient expressed understanding and agreed to proceed.  Patient Medicare AWV questionnaire was completed by the patient on 06/30/22; I have confirmed that all information answered by patient is correct and no changes since this date.           Subjective:   Erika Wolf is a 67 y.o. female who presents for an Initial Medicare Annual Wellness Visit.  Review of Systems     Cardiac Risk Factors include: advanced age (>36mn, >>45women);hypertension;obesity (BMI >30kg/m2)     Objective:    Today's Vitals   06/30/22 1101  Weight: 190 lb (86.2 kg)   Body mass index is 35.32 kg/m.     06/30/2022   11:07 AM 10/18/2018   12:05 PM 10/18/2018    7:02 AM 10/12/2018    1:41 PM  Advanced Directives  Does Patient Have a Medical Advance Directive? No No No No  Would patient like information on creating a medical advance directive? No - Patient declined No - Patient declined No - Patient declined No - Patient declined    Current Medications (verified) Outpatient Encounter Medications as of 06/30/2022  Medication Sig   albuterol (VENTOLIN HFA) 108 (90 Base) MCG/ACT inhaler Inhale 2 puffs into the lungs every 6 (six) hours as needed for wheezing or shortness of breath.   amLODipine (NORVASC) 5 MG tablet Take 1 tablet (5 mg total) by mouth daily.   calcium-vitamin D (OSCAL WITH D) 500-200 MG-UNIT TABS tablet Take by mouth.   fluticasone (FLONASE) 50 MCG/ACT nasal spray USE TWO SPRAYS IN EACH NOSTRIL ONE TIME DAILY FOR 10 DAYS   losartan-hydrochlorothiazide (HYZAAR) 100-25 MG tablet Take 1 tablet by mouth daily.   metoprolol tartrate (LOPRESSOR) 100 MG tablet Take 1 tablet (100 mg total) by  mouth daily.   nystatin-triamcinolone ointment (MYCOLOG) Apply 1 Application topically 2 (two) times daily. Use for 1 - 2 weeks.   omeprazole (PRILOSEC) 20 MG capsule Take 1 capsule (20 mg total) by mouth daily.   venlafaxine XR (EFFEXOR XR) 37.5 MG 24 hr capsule Take 1 capsule (37.5 mg total) by mouth daily.   [DISCONTINUED] cetirizine (ZYRTEC) 10 MG tablet Take 10 mg by mouth daily.   [DISCONTINUED] triamcinolone (KENALOG) 0.025 % ointment Apply 1 application. topically 2 (two) times daily. Use for 2 weeks at a time if needed.   No facility-administered encounter medications on file as of 06/30/2022.    Allergies (verified) Patient has no known allergies.   History: Past Medical History:  Diagnosis Date   HTN (hypertension)    Hypertension    Phreesia 06/20/2020   Sleep apnea    [employer]reesia 06/20/2020   Urinary incontinence    cough, sneeze   Past Surgical History:  Procedure Laterality Date   CESAREAN SECTION  5 total   CESAREAN SECTION N/A    Phreesia 06/20/2020   CHOLECYSTECTOMY     FRACTURE SURGERY N/A    [employer]reesia 06/20/2020   GALLBLADDER SURGERY     JOINT REPLACEMENT N/A    Phreesia 06/20/2020   ROTATOR CUFF REPAIR     TOTAL ABDOMINAL HYSTERECTOMY  2005   Fibroid ovaries and tubes removed also   TOTAL HIP ARTHROPLASTY Right 10/18/2018   Procedure: TOTAL HIP ARTHROPLASTY ANTERIOR  APPROACH;  Surgeon: Gaynelle Arabian, MD;  Location: [facility];  Service: Orthopedics;  Laterality: Right;  15mn   Family History  Problem Relation Age of Onset   Hyperlipidemia Mother    Hypertension Mother    Dementia Mother    Cancer Father        colon   Aneurysm Father        brain   Cancer Sister        ovarian   Hyperlipidemia Brother    Breast cancer Daughter    Social History   Socioeconomic History   Marital status: Married    Spouse name: Not on file   Number of children: Not on file   Years of education: Not on file   Highest education level: Not on file  Occupational  History   Occupation: homemaker  Tobacco Use   Smoking status: Never   Smokeless tobacco: Never  Vaping Use   Vaping Use: Never used  Substance and Sexual Activity   Alcohol use: Yes    Alcohol/week: 3.0 standard drinks of alcohol    Types: 3 Glasses of wine per week   Drug use: No   Sexual activity: Yes    Birth control/protection: Surgical    Comment: hysterectomy, <5 sexual partner, >16, no STD, no  Other Topics Concern   Not on file  Social History Narrative   Married;   5 children, 4 [place] 1 [place]   2 grandchildren         Social Determinants of Health   Financial Resource Strain: Low Risk  (06/30/2022)   Overall Financial Resource Strain (CARDIA)    Difficulty of Paying Living Expenses: Not hard at all  Food Insecurity: No Food Insecurity (06/30/2022)   Hunger Vital Sign    Worried About Running Out of Food in the Last Year: Never true    Ran Out of Food in the Last Year: Never true  Transportation Needs: No Transportation Needs (06/30/2022)   PRAPARE - THydrologist(Medical): No    Lack of Transportation (Non-Medical): No  Physical Activity: Insufficiently Active (06/30/2022)   Exercise Vital Sign    Days of Exercise per Week: 1 day    Minutes of Exercise per Session: 20 min  Stress: No Stress Concern Present (06/30/2022)   [place]   Feeling of Stress : Not at all  Social Connections: [place](06/30/2022)   Social Connection and Isolation Panel [NHANES]    Frequency of Communication with Friends and Family: More than three times a week    Frequency of Social Gatherings with Friends and Family: More than three times a week    Attends Religious Services: More than 4 times per year    Active Member of [employer] Organizations: Yes    Attends CMusic therapist More than 4 times per year    Marital Status: Married    Tobacco  Counseling Counseling given: Not Answered   Clinical Intake:  Pre-visit preparation completed: Yes  Pain : No/denies pain     BMI - recorded: 35.32 Nutritional Status: BMI > 30  Obese Nutritional Risks: None Diabetes: No  How often do you need to have someone help you when you read instructions, pamphlets, or other written materials from your doctor or pharmacy?: 1 - Never  Diabetic?no  Interpreter Needed?: No  Information entered by :: TCharlott Rakes LPN   Activities of  Daily Living    06/30/2022    9:34 AM  In your present state of health, do you have any difficulty performing the following activities:  Hearing? 0  Vision? 0  Difficulty concentrating or making decisions? 0  Walking or climbing stairs? 0  Dressing or bathing? 0  Doing errands, shopping? 0  Preparing Food and eating ? N  Using the Toilet? N  In the past six months, have you accidently leaked urine? Y  Comment pt wearspad daily  Do you have problems with loss of bowel control? N  Managing your Medications? N  Managing your Finances? N  Housekeeping or managing your Housekeeping? N    Patient Care Team: Wendie Agreste, MD as PCP - General (Family Medicine) Yisroel Ramming, Everardo All, MD as Consulting Physician (Obstetrics and Gynecology)  Indicate any recent Medical Services you may have received from other than [facility] providers in the past year (date may be approximate).     Assessment:   This is a routine wellness examination for Erika Wolf.  Hearing/Vision screen Hearing Screening - Comments:: Pt wears hearing aids  Vision Screening - Comments:: Pt encouraged to follow up with provider   Dietary issues and exercise activities discussed: Current Exercise Habits: Home exercise routine, Type of exercise: walking, Time (Minutes): 30, Frequency (Times/Week): 1, Weekly Exercise (Minutes/Week): 30   Goals Addressed             This Visit's Progress    Patient Stated       Decrease blood  pressure medications        Depression Screen    06/30/2022   11:06 AM 02/17/2022    1:02 PM 04/27/2021    1:46 PM 03/18/2021    1:49 PM 06/27/2020   10:09 AM 06/18/2020   10:26 AM 12/28/2019   11:09 AM  PHQ 2/9 Scores  PHQ - 2 Score 0 0 0 0 0 0 0  PHQ- 9 Score  0         Fall Risk    06/30/2022   11:08 AM 06/30/2022    9:34 AM 06/08/2022   11:17 AM 02/17/2022    1:03 PM 08/06/2021   11:07 AM  [place] in the past year? 0 0 0 0 0  Number falls in past yr: 0  0  0  Injury with Fall? 0 0 0  0  Risk for fall due to : Impaired mobility   No Fall Risks No Fall Risks  Follow up Falls prevention discussed   Falls evaluation completed Falls evaluation completed    [place]:  Any stairs in or around the home? Yes  If so, are there any without handrails? No  Home free of loose throw rugs in walkways, pet beds, electrical cords, etc? Yes  Adequate lighting in your home to reduce risk of falls? Yes   ASSISTIVE DEVICES UTILIZED TO PREVENT FALLS:  Life alert? No  Use of a cane, walker or w/c? No  Grab bars in the bathroom? No  Shower chair or bench in shower? No  Elevated toilet seat or a handicapped toilet? No   TIMED UP AND GO:  Was the test performed? No .   Cognitive Function:        06/30/2022   11:09 AM  6CIT Screen  What Year? 0 points  What month? 0 points  What time? 0 points  Count back from 20 0 points  Months in  reverse 0 points  Repeat phrase 0 points  Total Score 0 points    Immunizations Immunization History  Administered Date(s) Administered   Influenza,inj,quad, With Preservative 02/14/2018    TDAP status: Due, Education has been provided regarding the importance of this vaccine. Advised may receive this vaccine at local pharmacy or Health Dept. Aware to provide a copy of the vaccination record if obtained from local pharmacy or Health Dept. Verbalized acceptance and understanding.  Flu Vaccine status:  Declined, Education has been provided regarding the importance of this vaccine but patient still declined. Advised may receive this vaccine at local pharmacy or Health Dept. Aware to provide a copy of the vaccination record if obtained from local pharmacy or Health Dept. Verbalized acceptance and understanding.  Pneumococcal vaccine status: Declined,  Education has been provided regarding the importance of this vaccine but patient still declined. Advised may receive this vaccine at local pharmacy or Health Dept. Aware to provide a copy of the vaccination record if obtained from local pharmacy or Health Dept. Verbalized acceptance and understanding.   Covid-19 vaccine status: Declined, Education has been provided regarding the importance of this vaccine but patient still declined. Advised may receive this vaccine at local pharmacy or Health Dept.or vaccine clinic. Aware to provide a copy of the vaccination record if obtained from local pharmacy or Health Dept. Verbalized acceptance and understanding.  Qualifies for Shingles Vaccine? Yes   Zostavax completed No   Shingrix Completed?: No.    Education has been provided regarding the importance of this vaccine. Patient has been advised to call insurance company to determine out of pocket expense if they have not yet received this vaccine. Advised may also receive vaccine at local pharmacy or Health Dept. Verbalized acceptance and understanding.  Screening Tests Health Maintenance  Topic Date Due   Hepatitis C Screening  Never done   DTaP/Tdap/Td (1 - Tdap) Never done   Pneumonia Vaccine 5+ Years old (1 of 1 - PCV) 08/07/2022 (Originally 04/16/2021)   INFLUENZA VACCINE  08/15/2022 (Originally 12/15/2021)   MAMMOGRAM  04/16/2023   Medicare Annual Wellness (AWV)  07/01/2023   COLONOSCOPY (Pts 45-49yr Insurance coverage will need to be confirmed)  02/27/2024   DEXA SCAN  Completed   HPV VACCINES  Aged Out   COVID-19 Vaccine  Discontinued   Zoster  Vaccines- Shingrix  Discontinued    Health Maintenance  Health Maintenance Due  Topic Date Due   Hepatitis C Screening  Never done   DTaP/Tdap/Td (1 - Tdap) Never done    Colorectal cancer screening: Type of screening: Colonoscopy. Completed 02/27/19. Repeat every 5 years  Mammogram status: Completed 04/15/22. Repeat every year  Bone Density status: Completed 04/15/22. Results reflect: Bone density results: OSTEOPENIA. Repeat every 2 years.   Additional Screening:  Hepatitis C Screening: does qualify;   Vision Screening: Recommended annual ophthalmology exams for early detection of glaucoma and other disorders of the eye. Is the patient up to date with their annual eye exam?  No  Who is the provider or what is the name of the office in which the patient attends annual eye exams? Encouraged to follow up with provider  If pt is not established with a provider, would they like to be referred to a provider to establish care? No .   Dental Screening: Recommended annual dental exams for proper oral hygiene  Community Resource Referral / Chronic Care Management: CRR required this visit?  No   CCM required this visit?  No  Plan:     I have personally reviewed and noted the following in the patient's chart:   Medical and social history Use of alcohol, tobacco or illicit drugs  Current medications and supplements including opioid prescriptions. Patient is not currently taking opioid prescriptions. Functional ability and status Nutritional status Physical activity Advanced directives List of other physicians Hospitalizations, surgeries, and ER visits in previous 12 months Vitals Screenings to include cognitive, depression, and falls Referrals and appointments  In addition, I have reviewed and discussed with patient certain preventive protocols, quality metrics, and best practice recommendations. A written personalized care plan for preventive services as well as general  preventive health recommendations were provided to patient.     Willette Brace, LPN   [phone]   Nurse Notes: none

## 2022-07-06 NOTE — Progress Notes (Signed)
GYNECOLOGY  VISIT   HPI: 67 y.o.   Married  Caucasian  female   [phone] with No LMP recorded. Patient has had a hysterectomy.   here for   possible vulvar bx  She had signs of vulvitis at her visit on 06/08/22.  This was treated with Mycolog II.  Has been using once to twice a day, and the area is not improved.   She is weaning off Effexor.  She is taking Effexor 37.5 mg daily.  She had been taking Effexor 75 mg daily.   Will be moving in May or June.   GYNECOLOGIC HISTORY: No LMP recorded. Patient has had a hysterectomy. Contraception:  hysterectomy Menopausal hormone therapy:  n/a Last mammogram:   04/15/22, pt reports normal at [facility].  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    Past Medical History:  Diagnosis Date   HTN (hypertension)    Hypertension    [employer]rees[employer] 06/20/2020   Sleep apnea    [employer]rees[employer] 06/20/2020   Urinary incontinence    cough, sneeze    Past Surgical History:  Procedure Laterality Date   CESAREAN SECTION  5 total   CESAREAN SECTION N/A    [employer]reesia 06/20/2020   CHOLECYSTECTOMY     FRACTURE SURGERY N/A    [employer] 06/20/2020   GALLBLADDER SURGERY     JOINT REPLACEMENT N/A    [employer]reesia 06/20/2020   ROTATOR CUFF REPAIR     TOTAL ABDOMINAL HYSTERECTOMY  2005   Fibroid ovaries and tubes removed also   TOTAL HIP ARTHROPLASTY Right 10/18/2018   Procedure: TOTAL HIP ARTHROPLASTY ANTERIOR APPROACH;  Surgeon:  Gaynelle Arabian, MD;  Location: [facility];  Service: Orthopedics;  Laterality: Right;  148mn    Current Outpatient Medications  Medication Sig Dispense Refill   albuterol (VENTOLIN HFA) 108 (90 Base) MCG/ACT inhaler Inhale 2 puffs into the lungs every 6 (six) hours as needed for wheezing or shortness of breath. 8 g 3   amLODipine (NORVASC) 5 MG tablet Take 1 tablet (5 mg total) by mouth daily. 90 tablet 3   calcium-vitamin D (OSCAL WITH D) 500-200 MG-UNIT TABS tablet Take by mouth.     fluticasone (FLONASE) 50 MCG/ACT nasal spray USE TWO SPRAYS IN EACH NOSTRIL ONE TIME DAILY FOR 10 DAYS 16 mL 0   losartan-hydrochlorothiazide (HYZAAR) 100-25 MG tablet Take 1 tablet by mouth daily. 90 tablet 3   metoprolol tartrate (LOPRESSOR) 100 MG tablet Take 1 tablet (100 mg total) by mouth daily. 90 tablet 3   nystatin-triamcinolone ointment (MYCOLOG) Apply 1 Application topically 2 (two) times daily. Use for 1 - 2 weeks. 30 g 0   omeprazole (PRILOSEC) 20 MG capsule Take  1 capsule (20 mg total) by mouth daily. 90 capsule 1   venlafaxine XR (EFFEXOR XR) 37.5 MG 24 hr capsule Take 1 capsule (37.5 mg total) by mouth daily. 30 capsule 11   No current facility-administered medications for this visit.     ALLERGIES: Patient has no known allergies.  Family History  Problem Relation Age of Onset   Hyperlipidemia Mother    Hypertension Mother    Dementia Mother    Cancer Father        colon   Aneurysm Father        brain   Cancer Sister        ovarian   Hyperlipidemia Brother    Breast cancer Daughter     Social History   Socioeconomic History   Marital status: Married    Spouse name: Not on file   Number of children: Not on file   Years of education: Not on file   Highest education level: Not on file  Occupational History   Occupation: homemaker  Tobacco Use   Smoking status: Never   Smokeless tobacco: Never  Vaping Use   Vaping Use: Never used  Substance and Sexual Activity   Alcohol use:  Yes    Alcohol/week: 3.0 standard drinks of alcohol    Types: 3 Glasses of wine per week   Drug use: No   Sexual activity: Yes    Birth control/protection: Surgical    Comment: hysterectomy, <5 sexual partner, >16, no STD, no  Other Topics Concern   Not on file  Social History Narrative   Married;   5 children, 4 live in [place], 1 lives in [place].   2 grandchildren         Social Determinants of Health   Financial Resource Strain: Low Risk  (06/30/2022)   Overall Financial Resource Strain (CARDIA)    Difficulty of Paying Living Expenses: Not hard at all  Food Insecurity: No Food Insecurity (06/30/2022)   Hunger Vital Sign    Worried About Running Out of Food in the Last Year: Never true    Ran Out of Food in the Last Year: Never true  Transportation Needs: No Transportation Needs (06/30/2022)   PRAPARE - Hydrologist (Medical): No    Lack of Transportation (Non-Medical): No  Physical Activity: Insufficiently Active (06/30/2022)   Exercise Vital Sign    Days of Exercise per Week: 1 day    Minutes of Exercise per Session: 20 min  Stress: No Stress Concern Present (06/30/2022)   [place]    Feeling of Stress : Not at all  Social Connections: Rafael Gonzalez (06/30/2022)   Social Connection and Isolation Panel [NHANES]    Frequency of Communication with Friends and Family: More than three times a week    Frequency of Social Gatherings with Friends and Family: More than three times a week    Attends Religious Services: More than 4 times per year    Active Member of [employer] or Organizations: Yes    Attends Archivist Meetings: More than 4 times per year    Marital Status: Married  Human resources officer Violence: Not At Risk (06/30/2022)   Humiliation, Afraid, Rape, and Kick questionnaire    Fear of Current or Ex-Partner: No    Emotionally Abused: No    Physically Abused: No     Sexually Abused: No    Review of Systems  All other systems reviewed  and are negative.   PHYSICAL EXAMINATION:    BP 122/80 (BP Location: Right Arm, Patient Position: Sitting, Cuff Size: Large)   Pulse 60   Ht 5' 1.5" (1.562 m)   Wt 190 lb (86.2 kg)   SpO2 95%   BMI 35.32 kg/m     General appearance: alert, cooperative and appears stated age   Pelvic: External genitalia:  excoriations and subtle erythema of left labia majora (superior) and right labia majora (inferior).              Urethra:  normal appearing urethra with no masses, tenderness or lesions            Vulvar biopsies Consent done.  Hibiclens prep.  Local 1% lidocaine, lot [id number], exp 11/2024 3 mm punch biopsy of left labia majora and 3 mm punch biopsy to right labia majora.  Each to pathology separately.  Silver nitrate placed.  No complications.  Minimal EBL.   Chaperone was present for exam:  Raquel Sarna.  ASSESSMENT  Chronic vulvitis.  Encounter for medication monitoring.   PLAN  Fu biopsy results.  I anticipate an Rx for Valisone or Clobetasol ointment.  She will take the Effexor XR 37.5 mg every other day for one month. Then she will stop.  She declines switching to regular Effexor 37.5 mg to help with the weaning process.  Fu prn.   An After Visit Summary was printed and given to the patient.  10  total time was spent for this patient encounter, including preparation, face-to-face counseling with the patient, coordination of care, and documentation of the encounter.

## 2022-07-20 ENCOUNTER — Encounter: Payer: Self-pay | Admitting: Obstetrics and Gynecology

## 2022-07-20 ENCOUNTER — Other Ambulatory Visit (HOSPITAL_COMMUNITY)
Admission: RE | Admit: 2022-07-20 | Discharge: 2022-07-20 | Disposition: A | Discharge status: Home / Self Care | Payer: Medicare HMO | Source: Ambulatory Visit | Attending: Obstetrics and Gynecology | Admitting: Obstetrics and Gynecology

## 2022-07-20 ENCOUNTER — Ambulatory Visit: Payer: Medicare HMO | Admitting: Obstetrics and Gynecology

## 2022-07-20 VITALS — BP 122/80 | HR 60 | Ht 61.5 in | Wt 190.0 lb

## 2022-07-20 DIAGNOSIS — N763 Subacute and chronic vulvitis: Secondary | ICD-10-CM | POA: Diagnosis not present

## 2022-07-20 DIAGNOSIS — Z5181 Encounter for therapeutic drug level monitoring: Secondary | ICD-10-CM

## 2022-07-20 NOTE — Patient Instructions (Signed)
Vulva Biopsy, Care After After a vulva biopsy, it is common to have: Slight bleeding from the biopsy site. Soreness or slight pain at the biopsy site. Follow these instructions at home: Your doctor may give you more instructions. If you have problems, contact your doctor. Biopsy site care  Follow instructions from your doctor about how to take care of your biopsy site. Make sure you: Clean the area using water and mild soap two times a day or as told by your doctor. Gently pat the area dry. You may shower 24 hours after the procedure. If you were prescribed an antibiotic ointment, apply it as told by your doctor. Do not stop using the antibiotic even if your condition gets better. If told by your doctor, take a warm water bath (sitz bath) to help with pain and soreness. A sitz bath is taken while you are sitting down. Do this as often as told by your doctor. The water should only come up to your hips and cover your butt. You may pat the area dry with a soft, clean towel. Leave stitches (sutures) or skin glue in place for at least 2 weeks. Leave tape strips alone unless you are told to take them off. You may trim the edges of the tape strips if they curl up. Check your biopsy area every day for signs of infection. It may be helpful to use a handheld mirror to do this. Check for: Redness, swelling, or more pain. More fluid or blood. Warmth. Pus or a bad smell. Do not rub the biopsy area after peeing (urinating). Gently pat the area dry, or use a bottle filled with warm water (peri bottle) to clean the area. Gently wipe from front to back. Lifestyle Wear loose, cotton underwear. Do not wear tight pants. For at least 1 week or until your doctor says it is okay: Do not use a tampon, douche, or put anything in your vagina. Do not have sex. Until your doctor says it is okay: Do not exercise. Do not swim or use a hot tub. General instructions Take over-the-counter and prescription  medicines only as told by your doctor. Drink enough fluid to keep your pee (urine) pale yellow. Use a sanitary pad until the bleeding stops. If told, put ice on the biopsy site. To do this: Place ice in a plastic bag. Place a towel between your skin and the bag. Leave the ice on for 20 minutes, 2-3 times a day. Take off the ice if your skin turns bright red. This is very important. If you cannot feel pain, heat, or cold, you have a greater risk of damage to the area. Keep all follow-up visits. Contact a doctor if: You have redness, swelling, or more pain around your biopsy site. You have more fluid or blood coming from your biopsy site. Your biopsy site feels warm when you touch it. Medicines or ice packs do not help with your pain. You have a fever or chills. Get help right away if: You have a lot of bleeding from the vulva. You have pus or a bad smell coming from your biopsy site. You have pain in your belly (abdomen). Summary After the procedure, it is common to have slight bleeding and soreness at the biopsy site. Follow all instructions as told by your doctor. Take sitz baths as told by your doctor. Leave any stitches in place. Check your biopsy site for infection. Signs include redness, swelling, more pain, more fluid or blood, or warmth. Get help  right away if you have a lot of bleeding, pus or a bad smell, or pain in your belly. This information is not intended to replace advice given to you by your health care provider. Make sure you discuss any questions you have with your health care provider. Document Revised: 01/20/2021 Document Reviewed: 01/20/2021 Elsevier Patient Education  [place].

## 2022-07-22 LAB — SURGICAL PATHOLOGY

## 2022-07-26 ENCOUNTER — Other Ambulatory Visit: Payer: Self-pay

## 2022-07-26 MED ORDER — BETAMETHASONE VALERATE 0.1 % EX OINT: TOPICAL_OINTMENT | CUTANEOUS | 1 refills | Status: DC

## 2022-09-02 ENCOUNTER — Other Ambulatory Visit: Payer: Self-pay | Admitting: Family Medicine

## 2022-09-06 DIAGNOSIS — M533 Sacrococcygeal disorders, not elsewhere classified: Secondary | ICD-10-CM | POA: Diagnosis not present

## 2022-09-07 DIAGNOSIS — M461 Sacroiliitis, not elsewhere classified: Secondary | ICD-10-CM | POA: Diagnosis not present

## 2022-09-07 DIAGNOSIS — M533 Sacrococcygeal disorders, not elsewhere classified: Secondary | ICD-10-CM | POA: Diagnosis not present

## 2022-10-05 DIAGNOSIS — G4733 Obstructive sleep apnea (adult) (pediatric): Secondary | ICD-10-CM | POA: Diagnosis not present

## 2022-10-18 DIAGNOSIS — Z9889 Other specified postprocedural states: Secondary | ICD-10-CM | POA: Diagnosis not present

## 2022-11-05 DIAGNOSIS — G4733 Obstructive sleep apnea (adult) (pediatric): Secondary | ICD-10-CM | POA: Diagnosis not present

## 2022-12-05 DIAGNOSIS — G4733 Obstructive sleep apnea (adult) (pediatric): Secondary | ICD-10-CM | POA: Diagnosis not present

## 2023-04-08 IMAGING — CT CT BIOPSY
1 of 4 series · 12 of 32 positions shown, 18 images · non-contrast
Comparison: none

CLINICAL DATA: Left posterior pelvic pain.  Sacroiliitis.

[Series 2: needle -guided injection · axial · 0.86mm/px · z∈[-139,-73]mm · 12 of 41 slices shown, 18 images]
[im 4/41  soft-tissue]
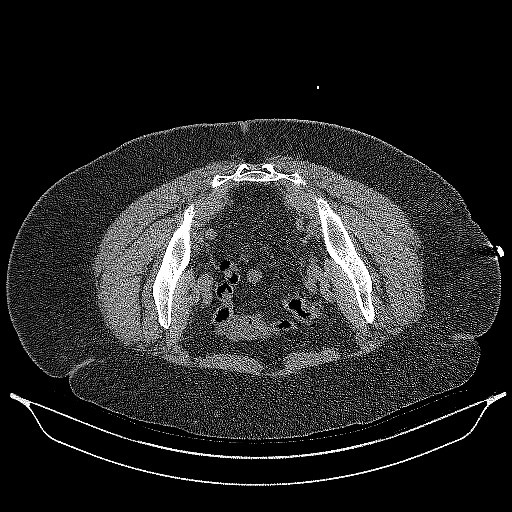
[im 4/41  bone]
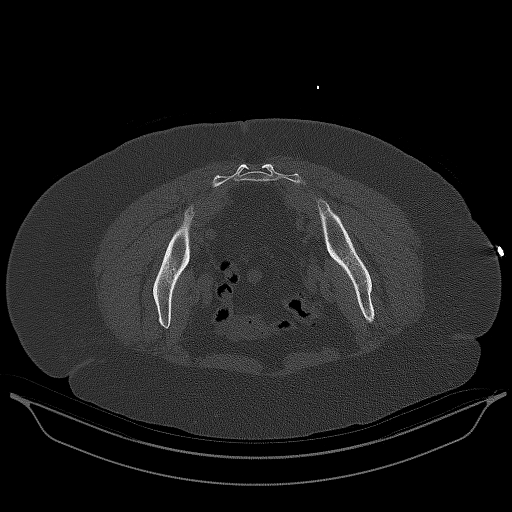
[im 7/41  soft-tissue]
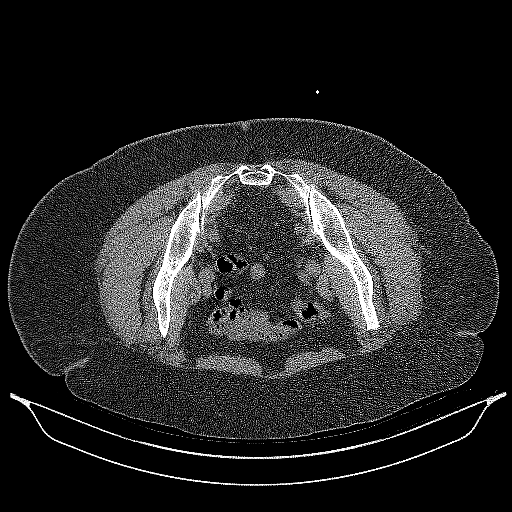
[im 10/41  soft-tissue]
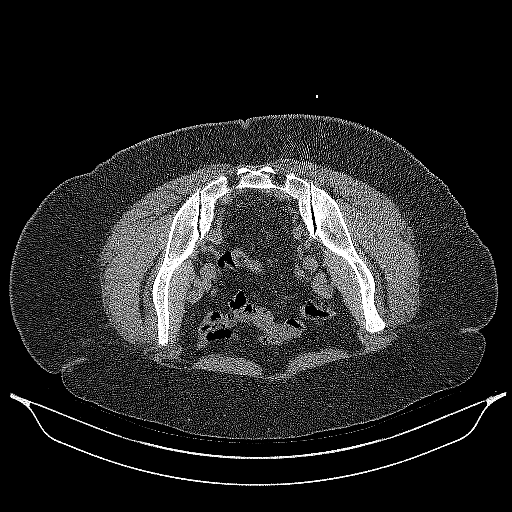
[im 13/41  soft-tissue]
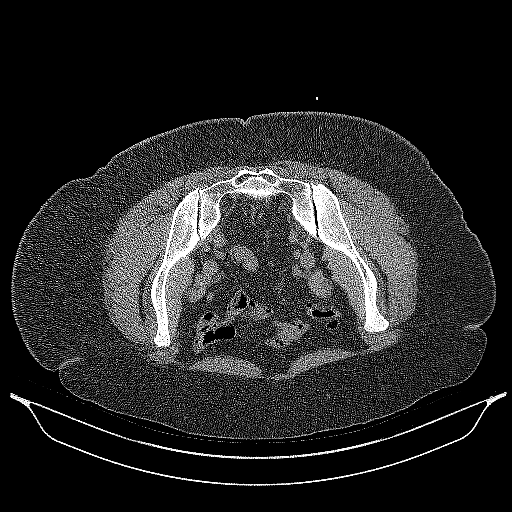
[im 16/41  soft-tissue]
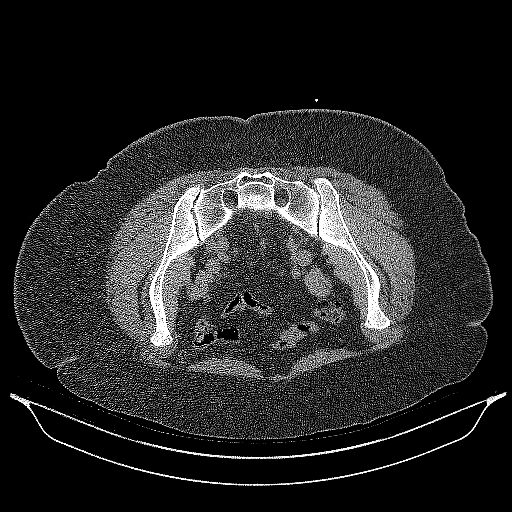
[im 19/41  soft-tissue]
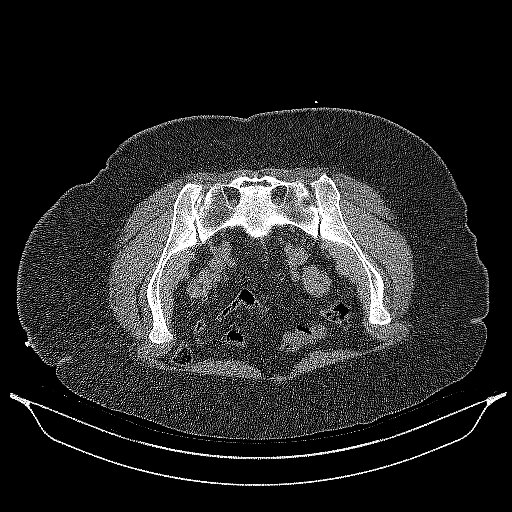
[im 22/41  soft-tissue]
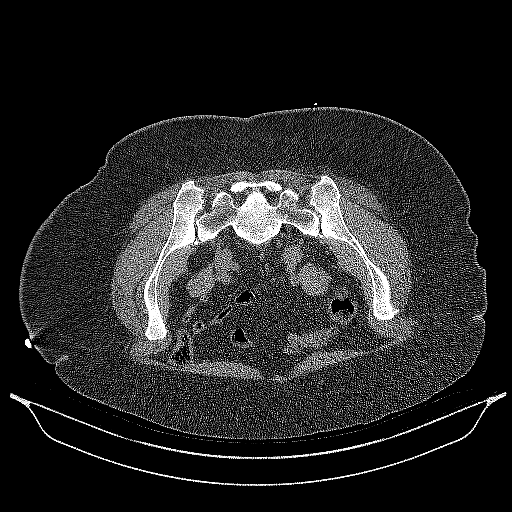
[im 25/41  soft-tissue]
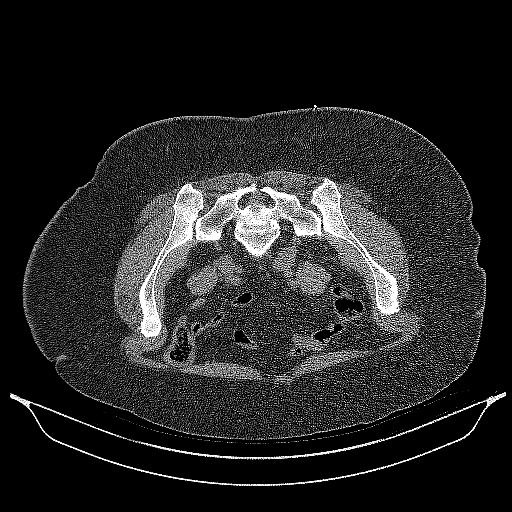
[im 28/41  soft-tissue]
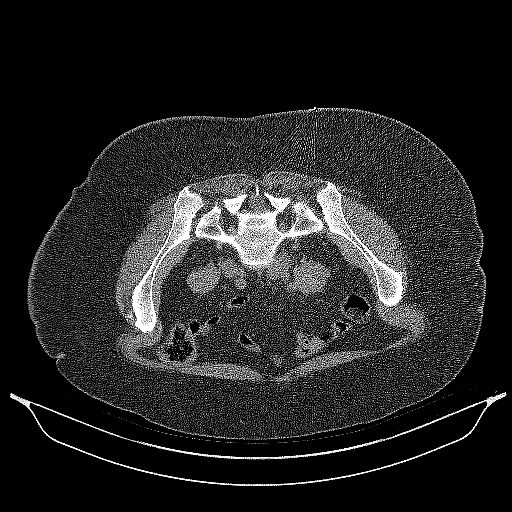
[im 28/41  lung]
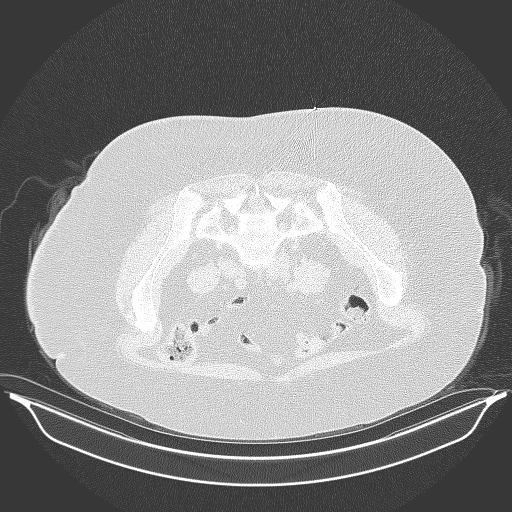
[im 28/41  bone]
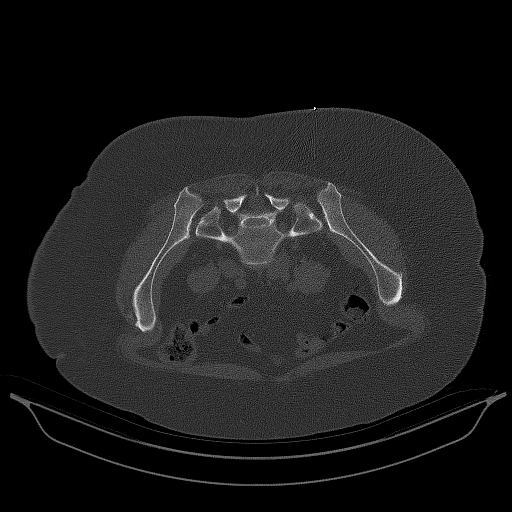
[im 31/41  soft-tissue]
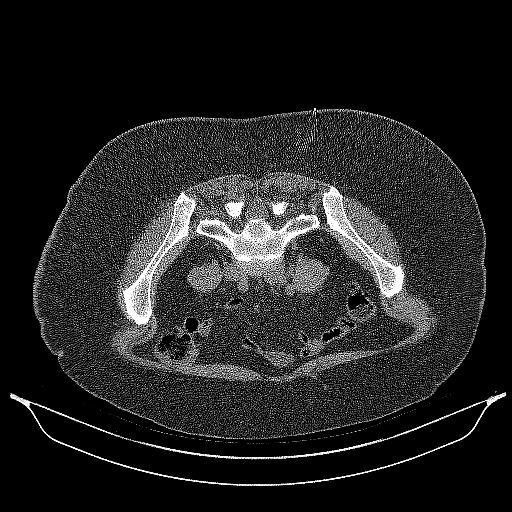
[im 31/41  lung]
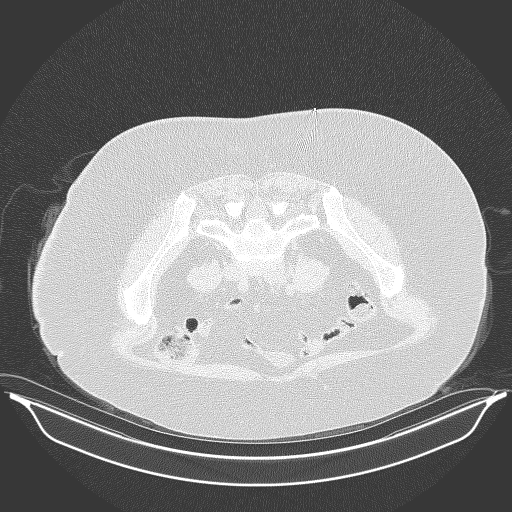
[im 34/41  soft-tissue]
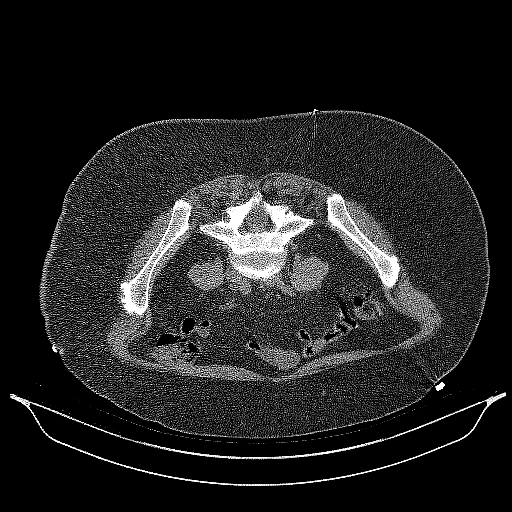
[im 34/41  lung]
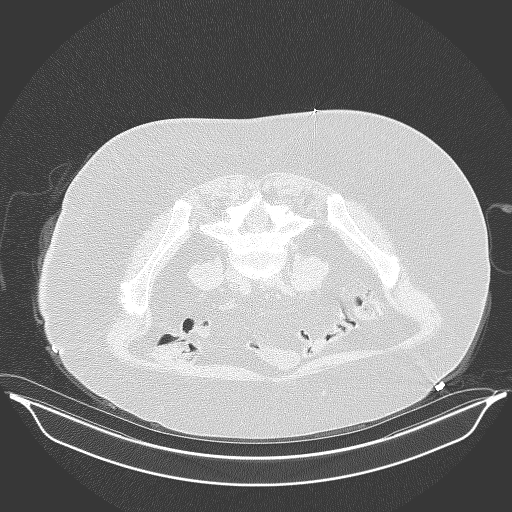
[im 37/41  soft-tissue]
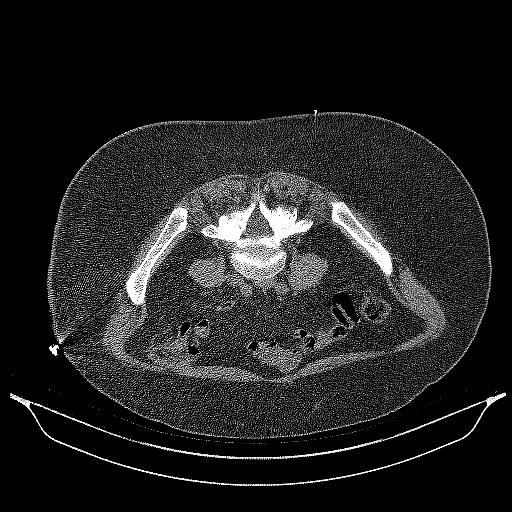
[im 37/41  lung]
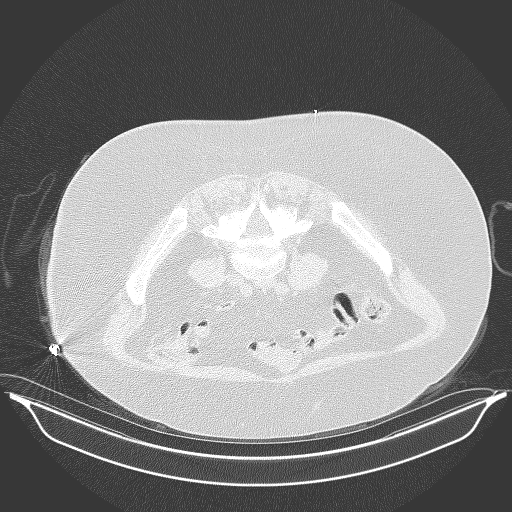

[12 of 32 positions shown; findings below may reference images not displayed]

EXAM:
Left CT GUIDED SI JOINT INJECTION

RADIATION DOSE REDUCTION: This exam was performed according to the
departmental dose-optimization program which includes automated
exposure control, adjustment of the mA and/or kV according to
patient size and/or use of iterative reconstruction technique.



After local anesthesia with 1% lidocaine without epinephrine and
subsequent deep anesthesia, a 3.5 inch 22 gauge spinal needle was
advanced into the left SI joint under intermittent CT guidance.

Once the needle was in satisfactory position, representative image
was captured with the needle demonstrated in the sacroiliac joint.
Subsequently, 1.5 mL 0.5% bupivacaine was injected into the left SI
joint. Needle was removed and a sterile dressing applied.

No complications were observed.
IMPRESSION: Successful CT-guided left SI joint injection.

## 2023-12-16 ENCOUNTER — Other Ambulatory Visit: Payer: Self-pay | Admitting: Obstetrics and Gynecology

## 2023-12-16 MED ORDER — BETAMETHASONE VALERATE 0.1 % EX OINT: TOPICAL_OINTMENT | CUTANEOUS | 0 refills | Status: AC

## 2023-12-16 NOTE — Progress Notes (Signed)
 Refill for valisone  ointment.
